# Patient Record
Sex: Male | Born: 1990 | Race: Black or African American | Hispanic: No | Marital: Married | Smoking: Current every day smoker
Health system: Southern US, Community
[De-identification: ages and names within clinical notes are randomized; demographics above are authoritative.]

## PROBLEM LIST (undated history)

## (undated) DIAGNOSIS — Z992 Dependence on renal dialysis: Secondary | ICD-10-CM

## (undated) DIAGNOSIS — I10 Essential (primary) hypertension: Secondary | ICD-10-CM

---

## 2020-06-16 ENCOUNTER — Inpatient Hospital Stay
Admission: EM | Admit: 2020-06-16 | Discharge: 2020-07-06 | DRG: 871 | Disposition: A | Discharge status: Home / Self Care | Payer: Medicaid Other | Attending: Hospitalist | Admitting: Hospitalist

## 2020-06-16 ENCOUNTER — Other Ambulatory Visit: Payer: Self-pay

## 2020-06-16 ENCOUNTER — Emergency Department: Payer: Medicaid Other

## 2020-06-16 DIAGNOSIS — J90 Pleural effusion, not elsewhere classified: Secondary | ICD-10-CM

## 2020-06-16 DIAGNOSIS — F17200 Nicotine dependence, unspecified, uncomplicated: Secondary | ICD-10-CM | POA: Diagnosis present

## 2020-06-16 DIAGNOSIS — E875 Hyperkalemia: Secondary | ICD-10-CM | POA: Diagnosis present

## 2020-06-16 DIAGNOSIS — E860 Dehydration: Secondary | ICD-10-CM | POA: Diagnosis present

## 2020-06-16 DIAGNOSIS — R7881 Bacteremia: Secondary | ICD-10-CM | POA: Diagnosis not present

## 2020-06-16 DIAGNOSIS — N2581 Secondary hyperparathyroidism of renal origin: Secondary | ICD-10-CM | POA: Diagnosis present

## 2020-06-16 DIAGNOSIS — D62 Acute posthemorrhagic anemia: Secondary | ICD-10-CM | POA: Diagnosis not present

## 2020-06-16 DIAGNOSIS — J13 Pneumonia due to Streptococcus pneumoniae: Secondary | ICD-10-CM | POA: Diagnosis present

## 2020-06-16 DIAGNOSIS — N179 Acute kidney failure, unspecified: Secondary | ICD-10-CM | POA: Diagnosis present

## 2020-06-16 DIAGNOSIS — Z20822 Contact with and (suspected) exposure to covid-19: Secondary | ICD-10-CM | POA: Diagnosis present

## 2020-06-16 DIAGNOSIS — Z841 Family history of disorders of kidney and ureter: Secondary | ICD-10-CM

## 2020-06-16 DIAGNOSIS — I248 Other forms of acute ischemic heart disease: Secondary | ICD-10-CM | POA: Diagnosis present

## 2020-06-16 DIAGNOSIS — N186 End stage renal disease: Secondary | ICD-10-CM | POA: Diagnosis present

## 2020-06-16 DIAGNOSIS — T82838A Hemorrhage of vascular prosthetic devices, implants and grafts, initial encounter: Secondary | ICD-10-CM | POA: Diagnosis not present

## 2020-06-16 DIAGNOSIS — D631 Anemia in chronic kidney disease: Secondary | ICD-10-CM | POA: Diagnosis present

## 2020-06-16 DIAGNOSIS — I10 Essential (primary) hypertension: Secondary | ICD-10-CM | POA: Diagnosis present

## 2020-06-16 DIAGNOSIS — E871 Hypo-osmolality and hyponatremia: Secondary | ICD-10-CM | POA: Diagnosis present

## 2020-06-16 DIAGNOSIS — I120 Hypertensive chronic kidney disease with stage 5 chronic kidney disease or end stage renal disease: Secondary | ICD-10-CM | POA: Diagnosis present

## 2020-06-16 DIAGNOSIS — B20 Human immunodeficiency virus [HIV] disease: Secondary | ICD-10-CM | POA: Diagnosis present

## 2020-06-16 DIAGNOSIS — J869 Pyothorax without fistula: Secondary | ICD-10-CM | POA: Diagnosis not present

## 2020-06-16 DIAGNOSIS — A419 Sepsis, unspecified organism: Secondary | ICD-10-CM | POA: Diagnosis not present

## 2020-06-16 DIAGNOSIS — R059 Cough, unspecified: Secondary | ICD-10-CM | POA: Diagnosis not present

## 2020-06-16 DIAGNOSIS — R778 Other specified abnormalities of plasma proteins: Secondary | ICD-10-CM | POA: Diagnosis not present

## 2020-06-16 DIAGNOSIS — M549 Dorsalgia, unspecified: Secondary | ICD-10-CM | POA: Diagnosis present

## 2020-06-16 DIAGNOSIS — R7989 Other specified abnormal findings of blood chemistry: Secondary | ICD-10-CM | POA: Diagnosis present

## 2020-06-16 DIAGNOSIS — D6959 Other secondary thrombocytopenia: Secondary | ICD-10-CM | POA: Diagnosis present

## 2020-06-16 DIAGNOSIS — B953 Streptococcus pneumoniae as the cause of diseases classified elsewhere: Secondary | ICD-10-CM | POA: Diagnosis present

## 2020-06-16 DIAGNOSIS — A403 Sepsis due to Streptococcus pneumoniae: Secondary | ICD-10-CM | POA: Diagnosis present

## 2020-06-16 DIAGNOSIS — Z9114 Patient's other noncompliance with medication regimen: Secondary | ICD-10-CM

## 2020-06-16 DIAGNOSIS — R509 Fever, unspecified: Secondary | ICD-10-CM

## 2020-06-16 DIAGNOSIS — N185 Chronic kidney disease, stage 5: Secondary | ICD-10-CM | POA: Diagnosis not present

## 2020-06-16 DIAGNOSIS — Z992 Dependence on renal dialysis: Secondary | ICD-10-CM | POA: Diagnosis not present

## 2020-06-16 DIAGNOSIS — Z8249 Family history of ischemic heart disease and other diseases of the circulatory system: Secondary | ICD-10-CM | POA: Diagnosis not present

## 2020-06-16 DIAGNOSIS — J189 Pneumonia, unspecified organism: Secondary | ICD-10-CM | POA: Diagnosis present

## 2020-06-16 DIAGNOSIS — Y838 Other surgical procedures as the cause of abnormal reaction of the patient, or of later complication, without mention of misadventure at the time of the procedure: Secondary | ICD-10-CM | POA: Diagnosis not present

## 2020-06-16 DIAGNOSIS — Z09 Encounter for follow-up examination after completed treatment for conditions other than malignant neoplasm: Secondary | ICD-10-CM

## 2020-06-16 DIAGNOSIS — D509 Iron deficiency anemia, unspecified: Secondary | ICD-10-CM | POA: Diagnosis present

## 2020-06-16 DIAGNOSIS — N289 Disorder of kidney and ureter, unspecified: Secondary | ICD-10-CM

## 2020-06-16 DIAGNOSIS — Z833 Family history of diabetes mellitus: Secondary | ICD-10-CM

## 2020-06-16 DIAGNOSIS — Z9119 Patient's noncompliance with other medical treatment and regimen: Secondary | ICD-10-CM

## 2020-06-16 DIAGNOSIS — J918 Pleural effusion in other conditions classified elsewhere: Secondary | ICD-10-CM | POA: Diagnosis not present

## 2020-06-16 DIAGNOSIS — I4510 Unspecified right bundle-branch block: Secondary | ICD-10-CM | POA: Diagnosis present

## 2020-06-16 DIAGNOSIS — R000 Tachycardia, unspecified: Secondary | ICD-10-CM | POA: Diagnosis present

## 2020-06-16 HISTORY — DX: Essential (primary) hypertension: I10

## 2020-06-16 LAB — TROPONIN I (HIGH SENSITIVITY)
Troponin I (High Sensitivity): 145 ng/L (ref <18)
Troponin I (High Sensitivity): 120 ng/L (ref <18)
Troponin I (High Sensitivity): 113 ng/L (ref <18)
Troponin I (High Sensitivity): 100 ng/L (ref <18)

## 2020-06-16 LAB — BLOOD CULTURE ID PANEL (REFLEXED) - BCID2

## 2020-06-16 LAB — BASIC METABOLIC PANEL
Anion gap: 14 (ref 5–15)
Anion gap: 14 (ref 5–15)
BUN: 134 mg/dL — ABNORMAL HIGH (ref 6–20)
BUN: 131 mg/dL — ABNORMAL HIGH (ref 6–20)
CO2: 15 mmol/L — ABNORMAL LOW (ref 22–32)
CO2: 16 mmol/L — ABNORMAL LOW (ref 22–32)
Calcium: 7.5 mg/dL — ABNORMAL LOW (ref 8.9–10.3)
Calcium: 7.7 mg/dL — ABNORMAL LOW (ref 8.9–10.3)
Chloride: 102 mmol/L (ref 98–111)
Chloride: 102 mmol/L (ref 98–111)
Creatinine, Ser: 15.34 mg/dL — ABNORMAL HIGH (ref 0.61–1.24)
Creatinine, Ser: 15.38 mg/dL — ABNORMAL HIGH (ref 0.61–1.24)
GFR calc Af Amer: 4 mL/min — ABNORMAL LOW (ref >60)
GFR calc Af Amer: 4 mL/min — ABNORMAL LOW (ref >60)
GFR calc non Af Amer: 4 mL/min — ABNORMAL LOW (ref >60)
GFR calc non Af Amer: 4 mL/min — ABNORMAL LOW (ref >60)
Glucose, Bld: 186 mg/dL — ABNORMAL HIGH (ref 70–99)
Glucose, Bld: 108 mg/dL — ABNORMAL HIGH (ref 70–99)
Potassium: 4.8 mmol/L (ref 3.5–5.1)
Potassium: 5 mmol/L (ref 3.5–5.1)
Sodium: 131 mmol/L — ABNORMAL LOW (ref 135–145)
Sodium: 132 mmol/L — ABNORMAL LOW (ref 135–145)

## 2020-06-16 LAB — COMPREHENSIVE METABOLIC PANEL
ALT: 30 U/L (ref 0–44)
AST: 15 U/L (ref 15–41)
Albumin: 3.2 g/dL — ABNORMAL LOW (ref 3.5–5.0)
Alkaline Phosphatase: 63 U/L (ref 38–126)
Anion gap: 17 — ABNORMAL HIGH (ref 5–15)
BUN: 134 mg/dL — ABNORMAL HIGH (ref 6–20)
CO2: 12 mmol/L — ABNORMAL LOW (ref 22–32)
Calcium: 8.4 mg/dL — ABNORMAL LOW (ref 8.9–10.3)
Chloride: 99 mmol/L (ref 98–111)
Creatinine, Ser: 16.68 mg/dL — ABNORMAL HIGH (ref 0.61–1.24)
GFR calc Af Amer: 4 mL/min — ABNORMAL LOW (ref >60)
GFR calc non Af Amer: 3 mL/min — ABNORMAL LOW (ref >60)
Glucose, Bld: 110 mg/dL — ABNORMAL HIGH (ref 70–99)
Potassium: 5.7 mmol/L — ABNORMAL HIGH (ref 3.5–5.1)
Sodium: 128 mmol/L — ABNORMAL LOW (ref 135–145)
Total Bilirubin: 0.7 mg/dL (ref 0.3–1.2)
Total Protein: 8.5 g/dL — ABNORMAL HIGH (ref 6.5–8.1)

## 2020-06-16 LAB — CBC WITH DIFFERENTIAL/PLATELET
Abs Immature Granulocytes: 0.36 10*3/uL — ABNORMAL HIGH (ref 0.00–0.07)
Basophils Absolute: 0 10*3/uL (ref 0.0–0.1)
Basophils Relative: 0 %
Eosinophils Absolute: 0 10*3/uL (ref 0.0–0.5)
Eosinophils Relative: 0 %
HCT: 22.3 % — ABNORMAL LOW (ref 39.0–52.0)
Hemoglobin: 7.4 g/dL — ABNORMAL LOW (ref 13.0–17.0)
Immature Granulocytes: 2 %
Lymphocytes Relative: 4 %
Lymphs Abs: 0.6 10*3/uL — ABNORMAL LOW (ref 0.7–4.0)
MCH: 26.1 pg (ref 26.0–34.0)
MCHC: 33.2 g/dL (ref 30.0–36.0)
MCV: 78.5 fL — ABNORMAL LOW (ref 80.0–100.0)
Monocytes Absolute: 0.5 10*3/uL (ref 0.1–1.0)
Monocytes Relative: 3 %
Neutro Abs: 14.7 10*3/uL — ABNORMAL HIGH (ref 1.7–7.7)
Neutrophils Relative %: 91 %
Platelets: 168 10*3/uL (ref 150–400)
RBC: 2.84 MIL/uL — ABNORMAL LOW (ref 4.22–5.81)
RDW: 17.5 % — ABNORMAL HIGH (ref 11.5–15.5)
Smear Review: NORMAL
WBC: 16.2 10*3/uL — ABNORMAL HIGH (ref 4.0–10.5)
nRBC: 0 % (ref 0.0–0.2)

## 2020-06-16 LAB — C-REACTIVE PROTEIN: CRP: 34.1 mg/dL — ABNORMAL HIGH (ref <1.0)

## 2020-06-16 LAB — SARS CORONAVIRUS 2 BY RT PCR (HOSPITAL ORDER, PERFORMED IN ~~LOC~~ HOSPITAL LAB): SARS Coronavirus 2: NEGATIVE

## 2020-06-16 LAB — LACTIC ACID, PLASMA
Lactic Acid, Venous: 0.9 mmol/L (ref 0.5–1.9)
Lactic Acid, Venous: 0.7 mmol/L (ref 0.5–1.9)

## 2020-06-16 LAB — TRIGLYCERIDES: Triglycerides: 135 mg/dL (ref <150)

## 2020-06-16 LAB — OSMOLALITY: Osmolality: 327 mOsm/kg (ref 275–295)

## 2020-06-16 LAB — FERRITIN: Ferritin: 192 ng/mL (ref 24–336)

## 2020-06-16 LAB — PROCALCITONIN: Procalcitonin: >150 ng/mL

## 2020-06-16 LAB — FIBRINOGEN: Fibrinogen: 657 mg/dL — ABNORMAL HIGH (ref 210–475)

## 2020-06-16 LAB — BRAIN NATRIURETIC PEPTIDE: B Natriuretic Peptide: 452 pg/mL — ABNORMAL HIGH (ref 0.0–100.0)

## 2020-06-16 LAB — LACTATE DEHYDROGENASE: LDH: 227 U/L — ABNORMAL HIGH (ref 98–192)

## 2020-06-16 LAB — FIBRIN DERIVATIVES D-DIMER (ARMC ONLY): Fibrin derivatives D-dimer (ARMC): 2350.74 ng/mL (FEU) — ABNORMAL HIGH (ref 0.00–499.00)

## 2020-06-16 MED ORDER — HYDRALAZINE HCL 20 MG/ML IJ SOLN
5.0000 mg | INTRAMUSCULAR | Status: DC | PRN
  Administered 2020-06-16 (×2): 5 mg via INTRAVENOUS
  Filled 2020-06-16 (×2): qty 1

## 2020-06-16 MED ORDER — LABETALOL HCL 5 MG/ML IV SOLN
5.0000 mg | Freq: Once | INTRAVENOUS | Status: AC
  Administered 2020-06-16: 5 mg via INTRAVENOUS
  Filled 2020-06-16: qty 4

## 2020-06-16 MED ORDER — SODIUM ZIRCONIUM CYCLOSILICATE 10 G PO PACK
10.0000 g | PACK | Freq: Once | ORAL | Status: DC
  Filled 2020-06-16: qty 1

## 2020-06-16 MED ORDER — DEXTROSE 50 % IV SOLN
50.0000 mL | Freq: Once | INTRAVENOUS | Status: AC
  Administered 2020-06-16: 50 mL via INTRAVENOUS
  Filled 2020-06-16: qty 50

## 2020-06-16 MED ORDER — LABETALOL HCL 5 MG/ML IV SOLN
10.0000 mg | INTRAVENOUS | Status: DC | PRN
  Administered 2020-06-17 – 2020-06-18 (×2): 10 mg via INTRAVENOUS
  Filled 2020-06-16 (×2): qty 4

## 2020-06-16 MED ORDER — ACETAMINOPHEN 325 MG PO TABS
650.0000 mg | ORAL_TABLET | Freq: Four times a day (QID) | ORAL | Status: DC | PRN
  Administered 2020-06-16 – 2020-07-05 (×5): 650 mg via ORAL
  Filled 2020-06-16 (×6): qty 2

## 2020-06-16 MED ORDER — TRAMADOL HCL 50 MG PO TABS
50.0000 mg | ORAL_TABLET | Freq: Four times a day (QID) | ORAL | Status: DC | PRN
  Administered 2020-06-16 – 2020-06-17 (×2): 50 mg via ORAL
  Filled 2020-06-16 (×2): qty 1

## 2020-06-16 MED ORDER — SODIUM CHLORIDE 0.9 % IV BOLUS
500.0000 mL | Freq: Once | INTRAVENOUS | Status: AC
  Administered 2020-06-16: 500 mL via INTRAVENOUS

## 2020-06-16 MED ORDER — SODIUM CHLORIDE 0.9 % IV SOLN
2.0000 g | INTRAVENOUS | Status: DC
  Administered 2020-06-16 – 2020-06-25 (×10): 2 g via INTRAVENOUS
  Filled 2020-06-16 (×2): qty 20
  Filled 2020-06-16: qty 2
  Filled 2020-06-16: qty 20
  Filled 2020-06-16 (×3): qty 2
  Filled 2020-06-16 (×2): qty 20
  Filled 2020-06-16 (×2): qty 2
  Filled 2020-06-16: qty 20

## 2020-06-16 MED ORDER — AMLODIPINE BESYLATE 5 MG PO TABS
5.0000 mg | ORAL_TABLET | Freq: Every day | ORAL | Status: DC
  Administered 2020-06-17: 5 mg via ORAL
  Filled 2020-06-16: qty 1

## 2020-06-16 MED ORDER — INSULIN ASPART 100 UNIT/ML IV SOLN
5.0000 [IU] | Freq: Once | INTRAVENOUS | Status: AC
  Administered 2020-06-16: 5 [IU] via INTRAVENOUS
  Filled 2020-06-16: qty 0.05

## 2020-06-16 MED ORDER — CALCIUM GLUCONATE-NACL 1-0.675 GM/50ML-% IV SOLN
1.0000 g | Freq: Once | INTRAVENOUS | Status: AC
  Administered 2020-06-16: 1000 mg via INTRAVENOUS
  Filled 2020-06-16: qty 50

## 2020-06-16 MED ORDER — PNEUMOCOCCAL VAC POLYVALENT 25 MCG/0.5ML IJ INJ: 0.5000 mL | INJECTION | INTRAMUSCULAR | Status: DC

## 2020-06-16 MED ORDER — SODIUM BICARBONATE 8.4 % IV SOLN
50.0000 meq | Freq: Once | INTRAVENOUS | Status: AC
  Administered 2020-06-16: 50 meq via INTRAVENOUS
  Filled 2020-06-16: qty 50

## 2020-06-16 MED ORDER — ONDANSETRON HCL 4 MG/2ML IJ SOLN
4.0000 mg | Freq: Three times a day (TID) | INTRAMUSCULAR | Status: DC | PRN
  Administered 2020-06-16 – 2020-06-18 (×3): 4 mg via INTRAVENOUS
  Filled 2020-06-16 (×3): qty 2

## 2020-06-16 MED ORDER — HYDRALAZINE HCL 20 MG/ML IJ SOLN
10.0000 mg | INTRAMUSCULAR | Status: DC | PRN
  Administered 2020-06-16 – 2020-06-21 (×5): 10 mg via INTRAVENOUS
  Filled 2020-06-16 (×5): qty 1

## 2020-06-16 MED ORDER — KETOROLAC TROMETHAMINE 30 MG/ML IJ SOLN
30.0000 mg | Freq: Once | INTRAMUSCULAR | Status: AC
  Administered 2020-06-16: 30 mg via INTRAVENOUS
  Filled 2020-06-16: qty 1

## 2020-06-16 MED ORDER — TRAMADOL HCL 50 MG PO TABS
50.0000 mg | ORAL_TABLET | Freq: Once | ORAL | Status: AC
  Administered 2020-06-16: 50 mg via ORAL
  Filled 2020-06-16: qty 1

## 2020-06-16 MED ORDER — HEPARIN SODIUM (PORCINE) 5000 UNIT/ML IJ SOLN
5000.0000 [IU] | Freq: Three times a day (TID) | INTRAMUSCULAR | Status: DC
  Administered 2020-06-16 – 2020-06-25 (×19): 5000 [IU] via SUBCUTANEOUS
  Filled 2020-06-16 (×22): qty 1

## 2020-06-16 MED ORDER — SODIUM CHLORIDE 0.9 % IV SOLN
500.0000 mg | INTRAVENOUS | Status: DC
  Administered 2020-06-16 – 2020-06-18 (×3): 500 mg via INTRAVENOUS
  Filled 2020-06-16 (×5): qty 500

## 2020-06-16 MED ORDER — ASPIRIN EC 81 MG PO TBEC
81.0000 mg | DELAYED_RELEASE_TABLET | Freq: Every day | ORAL | Status: DC
  Administered 2020-06-17 – 2020-06-26 (×9): 81 mg via ORAL
  Filled 2020-06-16 (×11): qty 1

## 2020-06-16 NOTE — ED Notes (Signed)
Date and time results received: 06/16/20 9:47 AM (use smartphrase ".now" to insert current time)  Test: troponin Critical Value: 145  Name of Provider Notified: Siadecki

## 2020-06-16 NOTE — H&P (Signed)
History and Physical    Daniel Combs XBD:[id number] DOB: [birth year] DOA: 06/16/2020  Referring MD/NP/PA:   PCP: System, Pcp Not In   Patient coming from:  The patient is coming from home.  At baseline, pt is independent for most of ADL.        Chief Complaint: SOB, cough and chest pain  HPI: Daniel Combs is a 29 y.o. male with medical history significant of hypertension, tobacco abuse, possible ESRD (his creatinine 11.1 on 03/20/2020), not on dialysis, who presents with shortness breath, cough, chest pain.  Patient is stating he has been having cough, shortness of breath for almost a week.  He also has chest pain and right sided back pain.  The chest pain is located in right side of the chest, sharp, pleuritic, aggravated with deep breath, 9 out of 10 severity, nonradiating.  He has cough with white-colored mucus production.  Denies fever or chills.  Patient states that he has nausea and vomited once yesterday, which has resolved currently.  Today patient does not have nausea vomiting, diarrhea or abdominal pain.  Denies symptoms of UTI or unilateral weakness.  Per chart review, patient had creatinine 11.1 on 03/20/2020.  Patient states that he is not on dialysis.  ED Course: pt was found to have WBC 16.2, troponin 145 --> 120, negative Covid PCR, sodium 128, potassium of 5.7, creatinine 16.68, BUN 134, temperature normal, blood pressure 171/135, heart rate 119, RR 25, oxygen saturation 98% on room air.  Chest x-ray showed infiltration in the left middle lobe and left lower lobe.  Patient is admitted to [place] bed as inpatient   Review of Systems:   General: no fevers, chills, no body weight gain, has fatigue HEENT: no blurry vision, hearing changes or sore throat Respiratory: has dyspnea, coughing, no wheezing CV: has chest pain, no palpitations GI: no nausea, vomiting, abdominal pain, diarrhea, constipation GU: no dysuria, burning on urination, increased urinary frequency,  hematuria  Ext: trace leg edema Neuro: no unilateral weakness, numbness, or tingling, no vision change or hearing loss Skin: no rash, no skin tear. MSK: No muscle spasm, no deformity, no limitation of range of movement in spin Heme: No easy bruising.  Travel history: No recent long distant travel.  Allergy: No Known Allergies  Past Medical History:  Diagnosis Date  . Hypertension     History reviewed. No pertinent surgical history.  Social History:  reports that he has been smoking. He has never used smokeless tobacco. He reports previous alcohol use. He reports that he does not use drugs.  Family History:  Family History  Problem Relation Age of Onset  . Diabetes Mellitus II Mother   . Hypertension Mother      Prior to Admission medications   Not on File    Physical Exam: Vitals:   06/16/20 1530 06/16/20 1600 06/16/20 1630 06/16/20 1700  BP: (!) 162/124 (!) 160/129 (!) 154/108 (!) 141/99  Pulse: (!) 115 (!) 107 (!) 109 (!) 107  Resp: 20 17 (!) 21 20  Temp:      TempSrc:      SpO2: 99% 100% 98% 98%   General: Not in acute distress HEENT:       Eyes: PERRL, EOMI, no scleral icterus.       ENT: No discharge from the ears and nose, no pharynx injection, no tonsillar enlargement.        Neck: No JVD, no bruit, no mass felt. Heme: No neck lymph node enlargement. Cardiac: S1/S2, RRR,  No murmurs, No gallops or rubs. Respiratory:  No rales, wheezing, rhonchi or rubs. GI: Soft, nondistended, nontender, no rebound pain, no organomegaly, BS present. GU: No hematuria Ext: has trace leg edema bilaterally. 1+DP/PT pulse bilaterally. Musculoskeletal: No joint deformities, No joint redness or warmth, no limitation of ROM in spin. Skin: No rashes.  Neuro: Alert, oriented X3, cranial nerves II-XII grossly intact, moves all extremities normally.  Psych: Patient is not psychotic, no suicidal or hemocidal ideation.  Labs on Admission: I have personally reviewed following labs and  imaging studies  CBC: Recent Labs  Lab 06/16/20 0832  WBC 16.2*  NEUTROABS 14.7*  HGB 7.4*  HCT 22.3*  MCV 78.5*  PLT 191   Basic Metabolic Panel: Recent Labs  Lab 06/16/20 0832 06/16/20 1331  NA 128* 131*  K 5.7* 4.8  CL 99 102  CO2 12* 15*  GLUCOSE 110* 186*  BUN 134* 134*  CREATININE 16.68* 15.34*  CALCIUM 8.4* 7.5*   GFR: CrCl cannot be calculated (Unknown ideal weight.). Liver Function Tests: Recent Labs  Lab 06/16/20 0832  AST 15  ALT 30  ALKPHOS 63  BILITOT 0.7  PROT 8.5*  ALBUMIN 3.2*   No results for input(s): LIPASE, AMYLASE in the last 168 hours. No results for input(s): AMMONIA in the last 168 hours. Coagulation Profile: No results for input(s): INR, PROTIME in the last 168 hours. Cardiac Enzymes: No results for input(s): CKTOTAL, CKMB, CKMBINDEX, TROPONINI in the last 168 hours. BNP (last 3 results) No results for input(s): PROBNP in the last 8760 hours. HbA1C: No results for input(s): HGBA1C in the last 72 hours. CBG: No results for input(s): GLUCAP in the last 168 hours. Lipid Profile: Recent Labs    06/16/20 0832  TRIG 135   Thyroid Function Tests: No results for input(s): TSH, T4TOTAL, FREET4, T3FREE, THYROIDAB in the last 72 hours. Anemia Panel: Recent Labs    06/16/20 0832  FERRITIN 192   Urine analysis: No results found for: COLORURINE, APPEARANCEUR, LABSPEC, PHURINE, GLUCOSEU, HGBUR, BILIRUBINUR, KETONESUR, PROTEINUR, UROBILINOGEN, NITRITE, LEUKOCYTESUR Sepsis Labs: @LABRCNTIP (procalcitonin:4,lacticidven:4) ) Recent Results (from the past 240 hour(s))  SARS Coronavirus 2 by RT PCR (hospital order, performed in [facility] hospital lab) Nasopharyngeal Nasopharyngeal Swab     Status: None   Collection Time: 06/16/20  8:32 AM   Specimen: Nasopharyngeal Swab  Result Value Ref Range Status   SARS Coronavirus 2 NEGATIVE NEGATIVE Final    Comment: (NOTE) SARS-CoV-2 target nucleic acids are NOT DETECTED.  The SARS-CoV-2 RNA  is generally detectable in upper and lower respiratory specimens during the acute phase of infection. The lowest concentration of SARS-CoV-2 viral copies this assay can detect is 250 copies / mL. A negative result does not preclude SARS-CoV-2 infection and should not be used as the sole basis for treatment or other patient management decisions.  A negative result may occur with improper specimen collection / handling, submission of specimen other than nasopharyngeal swab, presence of viral mutation(s) within the areas targeted by this assay, and inadequate number of viral copies (<250 copies / mL). A negative result must be combined with clinical observations, patient history, and epidemiological information.  Fact Sheet for Patients:   StrictlyIdeas.no  Fact Sheet for Healthcare Providers: BankingDealers.co.za  This test is not yet approved or  cleared by the [place] FDA and has been authorized for detection and/or diagnosis of SARS-CoV-2 by FDA under an Emergency Use Authorization (EUA).  This EUA will remain in effect (meaning this test can be  used) for the duration of the COVID-19 declaration under Section 564(b)(1) of the Act, 21 U.S.C. section 360bbb-3(b)(1), unless the authorization is terminated or revoked sooner.  Performed at [facility], [address] [place], [place] [address]      Radiological Exams on Admission: DG Chest [facility] 1 View  Result Date: 06/16/2020 CLINICAL DATA:  Shortness of breath with cough and chest pain EXAM: PORTABLE CHEST 1 VIEW COMPARISON:  None. FINDINGS: There is somewhat ill-defined airspace opacity in portions of the left mid lower lung regions. The right lung is clear. Heart is upper normal in size with pulmonary vascularity normal. No adenopathy. No bone lesions. IMPRESSION: Ill-defined airspace opacity left mid and lower lung regions consistent with multifocal pneumonia. Question  atypical organism pneumonia given this appearance. Lungs elsewhere clear. Heart upper normal in size. No evident adenopathy. Electronically Signed   By: Lowella Grip III M.D.   On: 06/16/2020 09:21     EKG: Independently reviewed.  Sinus rhythm, QTC 470, LAD, T wave thickening, LAD, incomplete right bundle blockage  Assessment/Plan Principal Problem:   CAP (community acquired pneumonia) Active Problems:   Hypertension   ESRD (end stage renal disease) (HCC)   Sepsis ([place])   Hyponatremia   Hyperkalemia   Elevated troponin   Anemia in ESRD (end-stage renal disease) ([place])   Sepsis due to CAP (community acquired pneumonia): Patient meets criteria for sepsis with leukocytosis, tachycardia with heart rate 119, tachypnea with RR 25.  Lactic acid is normal.  Currently hemodynamically stable.  Chest x-ray showed infiltration in left lower lobe and the left middle lobe, consistent with CAP.  - Will admit to med-surg bed as inpt - IV Rocephin and azithromycin - Mucinex for cough  - Bronchodilators - Urine legionella and S. pneumococcal antigen - Follow up blood culture x2, sputum culture - will get Procalcitonin >150 - IVF:  500 cc x 2 of NS bolus in ED  Hypertension -Amlodipine 5 mg daily -IV hydralazine as needed  ESRD (end stage renal disease) ([place]) -Renal, Dr. Holley Raring is consulted  Hyponatremia: Sodium 128. Most likely due to nausea, vomiting and poor oral intake  -Patient received 500 cc x 2 normal saline in ED - Will check urine sodium, urine osmolality, serum osmolality. - f/u by BMP q8h - avoid over correction too fast due to risk of central pontine myelinolysis  Hyperkalemia: K 5.7 -Patient was given D50, NovoLog 5 units, 50 mEq of sodium bicarbonate -10 g of leukoma  Elevated troponin: Troponin I 45, 120, likely due to decreased clearance secondary to ESRD -Aspirin 81 mg daily -Check A1c, FLP, UDS -Repeat EKG in the morning  Anemia in ESRD (end-stage renal  disease) ([place]): Hemoglobin 7.4 -Follow-up with CBC -Check anemia panel      DVT ppx: SQ Heparin Code Status: Full code Family Communication: not done, no family member is at bed side.   Disposition Plan:  Anticipate discharge back to previous environment Consults called:  Dr. Holley Raring of renal Admission status: Med-surg bed as inpt       Status is: Inpatient  Remains inpatient appropriate because:Inpatient level of care appropriate due to severity of illness.  Patient has a possible ESRD not on dialysis, now presents with multiple acute issues, including sepsis due to CAP, hyponatremia, hyperkalemia, elevated troponin, anemia.  His presentation is highly complicated.  Patient may need to start dialysis. He is at high risk of deteriorating.  Need to be treated in the hospital for at least 2 days.  Dispo: The patient is from: Home              Anticipated d/c is to: Home              Anticipated d/c date is: 2 days              Patient currently is not medically stable to d/c.           Date of Service 06/16/2020    Ivor Costa [facility]   If 7PM-7AM, please contact night-coverage [email or web address] 06/16/2020, 5:11 PM

## 2020-06-16 NOTE — ED Provider Notes (Signed)
[facility] Emergency Department Provider Note ____________________________________________   First MD Initiated Contact with Patient 06/16/20 0831     (approximate)  I have reviewed the triage vital signs and the nursing notes.   HISTORY  Chief Complaint Chest Pain, Cough, and Back Pain    HPI Daniel Combs is a 29 y.o. male with PMH as noted below who presents with chest pain over the last week, gradual onset, persistent course, described as sharp and pleuritic.  He has associated shortness of breath and some pain in his back.  He has not noted any fever at home.  He has had vomiting and diarrhea.  The patient denies any sick contacts.  He is not vaccinated for Covid.  He states that he used to be on labetalol for blood pressure but has not taken it in a few months.  Past Medical History:  Diagnosis Date  . Hypertension     Patient Active Problem List   Diagnosis Date Noted  . CAP (community acquired pneumonia) 06/16/2020    History reviewed. No pertinent surgical history.  Prior to Admission medications   Not on File    Allergies Patient has no known allergies.  History reviewed. No pertinent family history.  Social History Social History   Tobacco Use  . Smoking status: Current Every Day Smoker  . Smokeless tobacco: Never Used  Substance Use Topics  . Alcohol use: Not on file  . Drug use: Not on file    Review of Systems  Constitutional: No fever. Eyes: No redness. ENT: No sore throat. Cardiovascular: Positive for chest pain. Respiratory: Positive for shortness of breath. Gastrointestinal: No positive for vomiting and diarrhea.  Genitourinary: Negative for dysuria.  Musculoskeletal: Positive for back pain. Skin: Negative for rash. Neurological: Negative for headache.  ____________________________________________   PHYSICAL EXAM:  VITAL SIGNS: ED Triage Vitals  Enc Vitals Group     BP 06/16/20 0827 (!) 171/135      Pulse Rate 06/16/20 0827 (!) 119     Resp 06/16/20 0827 (!) 23     Temp 06/16/20 0827 98.9 F (37.2 C)     Temp Source 06/16/20 0827 Oral     SpO2 06/16/20 0827 98 %     Weight --      Height --      Head Circumference --      Peak Flow --      Pain Score 06/16/20 0828 10     Pain Loc --      Pain Edu? --      Excl. in [place]? --     Constitutional: Alert and oriented.  Uncomfortable appearing but in no acute distress. Eyes: Conjunctivae are normal.  Head: Atraumatic. Nose: No congestion/rhinnorhea. Mouth/Throat: Mucous membranes are moist.   Neck: Normal range of motion.  Cardiovascular: Tachycardic, regular rhythm. Grossly normal heart sounds.  Good peripheral circulation. Respiratory: Increased respiratory effort.  No retractions.  Slightly coarse breath sounds bilaterally. Gastrointestinal: Soft and nontender. No distention.  Genitourinary: No flank tenderness. Musculoskeletal: No lower extremity edema.  No calf or popliteal swelling or tenderness.  Extremities warm and well perfused.  Neurologic:  Normal speech and language. No gross focal neurologic deficits are appreciated.  Skin:  Skin is warm and dry. No rash noted. Psychiatric: Mood and affect are normal. Speech and behavior are normal.  ____________________________________________   LABS (all labs ordered are listed, but only abnormal results are displayed)  Labs Reviewed  CBC WITH DIFFERENTIAL/PLATELET - Abnormal;  Notable for the following components:      Result Value   WBC 16.2 (*)    RBC 2.84 (*)    Hemoglobin 7.4 (*)    HCT 22.3 (*)    MCV 78.5 (*)    RDW 17.5 (*)    Neutro Abs 14.7 (*)    Lymphs Abs 0.6 (*)    Abs Immature Granulocytes 0.36 (*)    All other components within normal limits  COMPREHENSIVE METABOLIC PANEL - Abnormal; Notable for the following components:   Sodium 128 (*)    Potassium 5.7 (*)    CO2 12 (*)    Glucose, Bld 110 (*)    BUN 134 (*)    Creatinine, Ser 16.68 (*)    Calcium  8.4 (*)    Total Protein 8.5 (*)    Albumin 3.2 (*)    GFR calc non Af Amer 3 (*)    GFR calc Af Amer 4 (*)    Anion gap 17 (*)    All other components within normal limits  FIBRIN DERIVATIVES D-DIMER ([facility] ONLY) - Abnormal; Notable for the following components:   Fibrin derivatives D-dimer ([facility]) 2,350.74 (*)    All other components within normal limits  LACTATE DEHYDROGENASE - Abnormal; Notable for the following components:   LDH 227 (*)    All other components within normal limits  FIBRINOGEN - Abnormal; Notable for the following components:   Fibrinogen 657 (*)    All other components within normal limits  TROPONIN I (HIGH SENSITIVITY) - Abnormal; Notable for the following components:   Troponin I (High Sensitivity) 145 (*)    All other components within normal limits  SARS CORONAVIRUS 2 BY RT PCR (HOSPITAL ORDER, [facility])  CULTURE, BLOOD (ROUTINE X 2)  CULTURE, BLOOD (ROUTINE X 2)  PROCALCITONIN  FERRITIN  TRIGLYCERIDES  LACTIC ACID, PLASMA  C-REACTIVE PROTEIN  BRAIN NATRIURETIC PEPTIDE  LACTIC ACID, PLASMA  TROPONIN I (HIGH SENSITIVITY)   ____________________________________________  EKG  ED ECG REPORT I, Arta Silence, the attending physician, personally viewed and interpreted this ECG.  Date: 06/16/2020 EKG Time: 0819 Rate: 119 Rhythm: Sinus tachycardia QRS Axis: normal Intervals: Incomplete RBBB, LAFB ST/T Wave abnormalities: normal Narrative Interpretation: Sinus tachycardia with no evidence of acute ischemia  ____________________________________________  RADIOLOGY  CXR: Multifocal pneumonia  ____________________________________________   PROCEDURES  Procedure(s) performed: No  Procedures  Critical Care performed: Yes  CRITICAL CARE Performed by: Arta Silence   Total critical care time: 30 minutes  Critical care time was exclusive of separately billable procedures and treating other  patients.  Critical care was necessary to treat or prevent imminent or life-threatening deterioration.  Critical care was time spent personally by me on the following activities: development of treatment plan with patient and/or surrogate as well as nursing, discussions with consultants, evaluation of patient's response to treatment, examination of patient, obtaining history from patient or surrogate, ordering and performing treatments and interventions, ordering and review of laboratory studies, ordering and review of radiographic studies, pulse oximetry and re-evaluation of patient's condition. ____________________________________________   INITIAL IMPRESSION / ASSESSMENT AND PLAN / ED COURSE  Pertinent labs & imaging results that were available during my care of the patient were reviewed by me and considered in my medical decision making (see chart for details).  29 year old male with a history of hypertension presents with atypical chest pain, shortness of breath and GI symptoms over the last week.  He is not vaccinated for COVID-19  but denies any sick contacts.  On exam, the patient is uncomfortable appearing but in no acute distress.  O2 saturation is in the high 90s on room air, however he does have tachypnea, tachycardia, and hypertension.  Temperature is also borderline elevated.  Slightly coarse breath sounds bilaterally.  Exam is otherwise unremarkable.  Differential includes COVID-19, acute bronchitis, pneumonia, or less likely ACS or other cardiac etiology.  Differential also includes PE, although the patient has no PE risk factors or any signs or symptoms of DVT.  There is no evidence of vascular etiology.  We will obtain chest x-ray, lab work-up, Covid swab, and reassess.  I have also ordered a fluid bolus, analgesia, and a dose of IV labetalol to treat the hypertension.  ----------------------------------------- 12:07 PM on  06/16/2020 -----------------------------------------  The patient's vital signs have improved and he remains hemodynamically stable.  On reassessment, he continues to have tachypnea but with improved work of breathing.   Chest x-ray shows multifocal infiltrates consistent with pneumonia.  However, the patient's Covid swab is negative.  Work-up is also significant for an elevated troponin of 145 and new AKI with a creatinine of 16.  I suspect that the troponin elevation is at least somewhat related to the renal insufficiency.  He is also borderline hyperkalemic although EKG shows normal QRS with only slightly peaked T waves.  The lactate is normal.  I ordered IV antibiotics for CAP.  I discussed the case with Dr. Blaine Hamper from the hospitalist service.  He has ordered [facility] for the hyperkalemia, and I ordered IV calcium.  ______________________  Lovena Le was evaluated in Emergency Department on 06/16/2020 for the symptoms described in the history of present illness. He was evaluated in the context of the global COVID-19 pandemic, which necessitated consideration that the patient might be at risk for infection with the SARS-CoV-2 virus that causes COVID-19. Institutional protocols and algorithms that pertain to the evaluation of patients at risk for COVID-19 are in a state of rapid change based on information released by regulatory bodies including the CDC and federal and state organizations. These policies and algorithms were followed during the patient's care in the ED.  ____________________________________________   FINAL CLINICAL IMPRESSION(S) / ED DIAGNOSES  Final diagnoses:  Community acquired pneumonia, unspecified laterality  Acute renal insufficiency  Hypertension, unspecified type  Hyperkalemia      NEW MEDICATIONS STARTED DURING THIS VISIT:  New Prescriptions   No medications on file     Note:  This document was prepared using Dragon voice recognition software and may  include unintentional dictation errors.    Arta Silence, MD 06/16/20 [id number]

## 2020-06-16 NOTE — ED Triage Notes (Signed)
PER EMS, CALLED TO PT'S HOME D/T CP, COUGH, AND BACK PAIN THAT'S BEEN GOING ON "FOR ABOUT A WEEK". PT HAS HX OF HTN AND HASN'T TAKEN HIS LABETOLOL IN "A FEW MONTHS'. PT ASKING FOR WATER AND PAIN MEDICATION UPON ARRIVAL. TEMP 99.66F. PT STATES HE HASN'T TAKEN ANYTHING AT HOME FOR SYMPTOMS. 325 ASA AND 1/2 [place] VIA EMS.

## 2020-06-17 ENCOUNTER — Inpatient Hospital Stay: Payer: Medicaid Other

## 2020-06-17 DIAGNOSIS — B20 Human immunodeficiency virus [HIV] disease: Secondary | ICD-10-CM

## 2020-06-17 LAB — BASIC METABOLIC PANEL
Anion gap: 16 — ABNORMAL HIGH (ref 5–15)
BUN: 145 mg/dL — ABNORMAL HIGH (ref 6–20)
CO2: 13 mmol/L — ABNORMAL LOW (ref 22–32)
Calcium: 7.7 mg/dL — ABNORMAL LOW (ref 8.9–10.3)
Chloride: 101 mmol/L (ref 98–111)
Creatinine, Ser: 16.44 mg/dL — ABNORMAL HIGH (ref 0.61–1.24)
GFR calc Af Amer: 4 mL/min — ABNORMAL LOW (ref >60)
GFR calc non Af Amer: 3 mL/min — ABNORMAL LOW (ref >60)
Glucose, Bld: 88 mg/dL (ref 70–99)
Potassium: 5.4 mmol/L — ABNORMAL HIGH (ref 3.5–5.1)
Sodium: 130 mmol/L — ABNORMAL LOW (ref 135–145)

## 2020-06-17 LAB — LIPID PANEL
Cholesterol: 94 mg/dL (ref 0–200)
HDL: 24 mg/dL — ABNORMAL LOW (ref >40)
LDL Cholesterol: 40 mg/dL (ref 0–99)
Total CHOL/HDL Ratio: 3.9 RATIO
Triglycerides: 151 mg/dL — ABNORMAL HIGH (ref <150)
VLDL: 30 mg/dL (ref 0–40)

## 2020-06-17 LAB — FERRITIN: Ferritin: 200 ng/mL (ref 24–336)

## 2020-06-17 LAB — VITAMIN B12: Vitamin B-12: 1376 pg/mL — ABNORMAL HIGH (ref 180–914)

## 2020-06-17 LAB — CBC
HCT: 19.3 % — ABNORMAL LOW (ref 39.0–52.0)
Hemoglobin: 6.5 g/dL — ABNORMAL LOW (ref 13.0–17.0)
MCH: 26.3 pg (ref 26.0–34.0)
MCHC: 33.7 g/dL (ref 30.0–36.0)
MCV: 78.1 fL — ABNORMAL LOW (ref 80.0–100.0)
Platelets: 149 10*3/uL — ABNORMAL LOW (ref 150–400)
RBC: 2.47 MIL/uL — ABNORMAL LOW (ref 4.22–5.81)
RDW: 17.7 % — ABNORMAL HIGH (ref 11.5–15.5)
WBC: 10.8 10*3/uL — ABNORMAL HIGH (ref 4.0–10.5)
nRBC: 0.4 % — ABNORMAL HIGH (ref 0.0–0.2)

## 2020-06-17 LAB — URINE DRUG SCREEN, QUALITATIVE (ARMC ONLY)
Amphetamines, Ur Screen: NOT DETECTED
Barbiturates, Ur Screen: NOT DETECTED
Benzodiazepine, Ur Scrn: NOT DETECTED
Cannabinoid 50 Ng, Ur ~~LOC~~: NOT DETECTED
Cocaine Metabolite,Ur ~~LOC~~: NOT DETECTED
MDMA (Ecstasy)Ur Screen: NOT DETECTED
Methadone Scn, Ur: NOT DETECTED
Opiate, Ur Screen: NOT DETECTED
Phencyclidine (PCP) Ur S: NOT DETECTED
Tricyclic, Ur Screen: NOT DETECTED

## 2020-06-17 LAB — IRON AND TIBC
Iron: 11 ug/dL — ABNORMAL LOW (ref 45–182)
Saturation Ratios: 9 % — ABNORMAL LOW (ref 17.9–39.5)
TIBC: 119 ug/dL — ABNORMAL LOW (ref 250–450)
UIBC: 108 ug/dL

## 2020-06-17 LAB — STREP PNEUMONIAE URINARY ANTIGEN: Strep Pneumo Urinary Antigen: POSITIVE — AB

## 2020-06-17 LAB — RETICULOCYTES
Immature Retic Fract: 7.9 % (ref 2.3–15.9)
RBC.: 2.58 MIL/uL — ABNORMAL LOW (ref 4.22–5.81)
Retic Count, Absolute: 48.8 10*3/uL (ref 19.0–186.0)
Retic Ct Pct: 1.9 % (ref 0.4–3.1)

## 2020-06-17 LAB — SODIUM, URINE, RANDOM: Sodium, Ur: 34 mmol/L

## 2020-06-17 LAB — HEMOGLOBIN A1C
Hgb A1c MFr Bld: 4.9 % (ref 4.8–5.6)
Mean Plasma Glucose: 93.93 mg/dL

## 2020-06-17 LAB — FOLATE: Folate: 7.9 ng/mL (ref >5.9)

## 2020-06-17 LAB — ABO/RH: ABO/RH(D): A POS

## 2020-06-17 LAB — HIV ANTIBODY (ROUTINE TESTING W REFLEX): HIV Screen 4th Generation wRfx: REACTIVE — AB

## 2020-06-17 LAB — PREPARE RBC (CROSSMATCH)

## 2020-06-17 LAB — HEMOGLOBIN AND HEMATOCRIT, BLOOD
HCT: 23.9 % — ABNORMAL LOW (ref 39.0–52.0)
Hemoglobin: 8.2 g/dL — ABNORMAL LOW (ref 13.0–17.0)

## 2020-06-17 LAB — OSMOLALITY, URINE: Osmolality, Ur: 323 mOsm/kg (ref 300–900)

## 2020-06-17 LAB — TROPONIN I (HIGH SENSITIVITY): Troponin I (High Sensitivity): 107 ng/L (ref <18)

## 2020-06-17 MED ORDER — OXYCODONE-ACETAMINOPHEN 7.5-325 MG PO TABS
1.0000 | ORAL_TABLET | Freq: Four times a day (QID) | ORAL | Status: DC | PRN
  Administered 2020-06-21 – 2020-07-05 (×21): 1 via ORAL
  Filled 2020-06-17 (×24): qty 1

## 2020-06-17 MED ORDER — AMLODIPINE BESYLATE 10 MG PO TABS
10.0000 mg | ORAL_TABLET | Freq: Every day | ORAL | Status: DC
  Administered 2020-06-18 – 2020-07-06 (×17): 10 mg via ORAL
  Filled 2020-06-17 (×19): qty 1

## 2020-06-17 MED ORDER — PATIROMER SORBITEX CALCIUM 8.4 G PO PACK
16.8000 g | PACK | Freq: Every day | ORAL | Status: DC
  Administered 2020-06-17 – 2020-06-18 (×2): 16.8 g via ORAL
  Filled 2020-06-17 (×3): qty 2

## 2020-06-17 MED ORDER — SODIUM ZIRCONIUM CYCLOSILICATE 5 G PO PACK: 5.0000 g | PACK | Freq: Once | ORAL | Status: DC

## 2020-06-17 MED ORDER — SODIUM CHLORIDE 0.9% IV SOLUTION: Freq: Once | INTRAVENOUS | Status: AC

## 2020-06-17 MED ORDER — HYDROCOD POLST-CPM POLST ER 10-8 MG/5ML PO SUER
5.0000 mL | Freq: Every evening | ORAL | Status: DC | PRN
  Administered 2020-06-17 – 2020-06-26 (×6): 5 mL via ORAL
  Filled 2020-06-17 (×6): qty 5

## 2020-06-17 MED ORDER — MORPHINE SULFATE (PF) 2 MG/ML IV SOLN
1.0000 mg | INTRAVENOUS | Status: DC | PRN
  Administered 2020-06-17 – 2020-06-28 (×7): 1 mg via INTRAVENOUS
  Filled 2020-06-17 (×7): qty 1

## 2020-06-17 MED ORDER — SODIUM CHLORIDE 0.9 % IV SOLN
150.0000 mg | INTRAVENOUS | Status: DC
  Administered 2020-06-17: 150 mg via INTRAVENOUS
  Filled 2020-06-17 (×3): qty 7.5

## 2020-06-17 MED ORDER — SODIUM ZIRCONIUM CYCLOSILICATE 10 G PO PACK
10.0000 g | PACK | Freq: Once | ORAL | Status: AC
  Administered 2020-06-17: 10 g via ORAL
  Filled 2020-06-17: qty 1

## 2020-06-17 NOTE — Progress Notes (Signed)
PT Cancellation Note  Patient Details Name: Daniel Combs MRN: [id number] DOB: 1991/06/23   Cancelled Treatment:    Reason Eval/Treat Not Completed: Other (comment). Consult received and chart reviewed. Pt with low Hgb (6.5) pending transfusion this date. Also with abnormal elevated K+. Not appropriate for PT evaluation this date. Will hold and re-attempt next available date.   Chaynce Schafer 06/17/2020, 9:47 AM  Greggory Stallion, PT, DPT [phone]

## 2020-06-17 NOTE — Progress Notes (Signed)
notifed MD on patient MEWS yellow BP 158/118 mHR 115 asked MD for morphine for patient pain not resolved and gave labetalol per MD for BP

## 2020-06-17 NOTE — Consult Note (Signed)
[facility] CONSULT NOTE    Date: 06/17/2020                  Patient Name:  Daniel Combs  MRN: [id number]  DOB: 1991/06/13  Age / Sex: 29 y.o., male         PCP: System, Pcp Not In                 Service Requesting Consult:  Hospitalist                 Reason for Consult:  End-stage renal disease not yet on dialysis            History of Present Illness: Patient is a 29 y.o. male with a PMHx of longstanding hypertension, ESRD not yet on dialysis, anemia of chronic kidney disease, secondary hyperparathyroidism, who was admitted to [facility] on 06/16/2020 for evaluation of shortness of breath and chest pain.  Patient from [place] and visiting his mother.  Has been here for approximately 3 weeks.  He was following with a nephrologist in [place] and was told approximately a year ago that he needed dialysis.  However patient states that due to the pandemic he was unable to keep some appointments.  He does not have an underlying dialysis access.  He has significant azotemia with a BUN of 145 and creatinine of 16.4.  He also has mild hyperkalemia with serum potassium of 5.4.  We had a long discussion today about renal placement therapy and patient not yet ready to start on dialysis per his report.  He had significantly elevated BNP of 452.  Also has significant anemia with hemoglobin of 6.5.   Medications: Outpatient medications: Medications Prior to Admission  Medication Sig Dispense Refill Last Dose  . losartan (COZAAR) 100 MG tablet Take 100 mg by mouth daily.   Unknown at Unknown    Current medications: Current Facility-Administered Medications  Medication Dose Route Frequency Provider Last Rate Last Admin  . 0.9 %  sodium chloride infusion (Manually program via Guardrails IV Fluids)   Intravenous Once Sharion Settler, NP      . acetaminophen (TYLENOL) tablet 650 mg  650 mg Oral Q6H PRN Ivor Costa, MD   650 mg at 06/16/20 2056  . [START ON 06/18/2020]  amLODipine (NORVASC) tablet 10 mg  10 mg Oral Daily Wyvonnia Dusky, MD      . aspirin EC tablet 81 mg  81 mg Oral Daily Ivor Costa, MD   81 mg at 06/17/20 0847  . azithromycin (ZITHROMAX) 500 mg in sodium chloride 0.9 % 250 mL IVPB  500 mg Intravenous Q24H Ivor Costa, MD 250 mL/hr at 06/17/20 1309 500 mg at 06/17/20 1309  . cefTRIAXone (ROCEPHIN) 2 g in sodium chloride 0.9 % 100 mL IVPB  2 g Intravenous Q24H Ivor Costa, MD 200 mL/hr at 06/17/20 0850 2 g at 06/17/20 0850  . chlorpheniramine-HYDROcodone (TUSSIONEX) 10-8 MG/5ML suspension 5 mL  5 mL Oral QHS PRN Wyvonnia Dusky, MD      . heparin injection 5,000 Units  5,000 Units Subcutaneous Q8H Ivor Costa, MD   5,000 Units at 06/17/20 1301  . hydrALAZINE (APRESOLINE) injection 10 mg  10 mg Intravenous Q4H PRN Sharion Settler, NP   10 mg at 06/16/20 2056  . iron sucrose (VENOFER) 150 mg in sodium chloride 0.9 % 150 mL IVPB  150 mg Intravenous Q24H Wyvonnia Dusky, MD 157.5 mL/hr at 06/17/20 1441 150 mg at 06/17/20  1441  . labetalol (NORMODYNE) injection 10 mg  10 mg Intravenous Q4H PRN Sharion Settler, NP   10 mg at 06/17/20 1301  . morphine 2 MG/ML injection 1 mg  1 mg Intravenous Q3H PRN Wyvonnia Dusky, MD   1 mg at 06/17/20 1301  . ondansetron (ZOFRAN) injection 4 mg  4 mg Intravenous Q8H PRN Ivor Costa, MD   4 mg at 06/17/20 1353  . oxyCODONE-acetaminophen (PERCOCET) 7.5-325 MG per tablet 1 tablet  1 tablet Oral Q6H PRN Wyvonnia Dusky, MD      . sodium zirconium cyclosilicate (LOKELMA) packet 10 g  10 g Oral Once Ivor Costa, MD          Allergies: No Known Allergies    Past Medical History: Past Medical History:  Diagnosis Date  . Hypertension      Past Surgical History: History reviewed. No pertinent surgical history.   Family History: Family History  Problem Relation Age of Onset  . Diabetes Mellitus II Mother   . Hypertension Mother      Social History: Social History   Socioeconomic History   . Marital status: Unknown    Spouse name: Not on file  . Number of children: Not on file  . Years of education: Not on file  . Highest education level: Not on file  Occupational History  . Not on file  Tobacco Use  . Smoking status: Current Every Day Smoker  . Smokeless tobacco: Never Used  Substance and Sexual Activity  . Alcohol use: Not Currently  . Drug use: Never  . Sexual activity: Not on file  Other Topics Concern  . Not on file  Social History Narrative  . Not on file   Social Determinants of Health   Financial Resource Strain:   . Difficulty of Paying Living Expenses: Not on file  Food Insecurity:   . Worried About Charity fundraiser in the Last Year: Not on file  . Ran Out of Food in the Last Year: Not on file  Transportation Needs:   . Lack of Transportation (Medical): Not on file  . Lack of Transportation (Non-Medical): Not on file  Physical Activity:   . Days of Exercise per Week: Not on file  . Minutes of Exercise per Session: Not on file  Stress:   . Feeling of Stress : Not on file  Social Connections:   . Frequency of Communication with Friends and Family: Not on file  . Frequency of Social Gatherings with Friends and Family: Not on file  . Attends Religious Services: Not on file  . Active Member of Clubs or Organizations: Not on file  . Attends Archivist Meetings: Not on file  . Marital Status: Not on file  Intimate Partner Violence:   . Fear of Current or Ex-Partner: Not on file  . Emotionally Abused: Not on file  . Physically Abused: Not on file  . Sexually Abused: Not on file     Review of Systems: Review of Systems  Constitutional: Positive for malaise/fatigue. Negative for chills and fever.  HENT: Negative for congestion, hearing loss and tinnitus.   Eyes: Negative for blurred vision and double vision.  Respiratory: Positive for shortness of breath. Negative for cough and sputum production.   Cardiovascular: Positive for chest  pain. Negative for palpitations and orthopnea.  Gastrointestinal: Positive for nausea and vomiting. Negative for diarrhea.  Genitourinary: Negative for dysuria, frequency and urgency.  Musculoskeletal: Negative for myalgias.  Skin: Negative for  itching and rash.  Neurological: Negative for dizziness and focal weakness.  Endo/Heme/Allergies: Negative for polydipsia. Does not bruise/bleed easily.  Psychiatric/Behavioral: The patient is not nervous/anxious.      Vital Signs: Blood pressure (!) 158/118, pulse (!) 115, temperature 98.3 F (36.8 C), temperature source Oral, resp. rate 16, height 5\' 6"  (1.676 m), weight 60.8 kg, SpO2 98 %.  Weight trends: Filed Weights   06/16/20 2228  Weight: 60.8 kg    Physical Exam: Physical Exam: General:  No acute distress  Head:  Normocephalic, atraumatic. Moist oral mucosal membranes  Eyes:  Anicteric  Neck:  Supple  Lungs:   Basilar rales  Heart:  S1S2 no obvious pericardial friction rub  Abdomen:   Soft, nontender, bowel sounds present  Extremities:  No peripheral edema.  Neurologic:  Awake, alert, following commands  Skin:  No lesions  Access:  No dialysis access    Lab results: Basic Metabolic Panel: Recent Labs  Lab 06/16/20 1331 06/16/20 2031 06/17/20 0540  NA 131* 132* 130*  K 4.8 5.0 5.4*  CL 102 102 101  CO2 15* 16* 13*  GLUCOSE 186* 108* 88  BUN 134* 131* 145*  CREATININE 15.34* 15.38* 16.44*  CALCIUM 7.5* 7.7* 7.7*    Liver Function Tests: Recent Labs  Lab 06/16/20 0832  AST 15  ALT 30  ALKPHOS 63  BILITOT 0.7  PROT 8.5*  ALBUMIN 3.2*   No results for input(s): LIPASE, AMYLASE in the last 168 hours. No results for input(s): AMMONIA in the last 168 hours.  CBC: Recent Labs  Lab 06/16/20 0832 06/17/20 0540  WBC 16.2* 10.8*  NEUTROABS 14.7*  --   HGB 7.4* 6.5*  HCT 22.3* 19.3*  MCV 78.5* 78.1*  PLT 168 149*    Cardiac Enzymes: No results for input(s): CKTOTAL, CKMB, CKMBINDEX, TROPONINI in the  last 168 hours.  BNP: Invalid input(s): POCBNP  CBG: No results for input(s): GLUCAP in the last 168 hours.  Microbiology: Results for orders placed or performed during the hospital encounter of 06/16/20  SARS Coronavirus 2 by RT PCR (hospital order, performed in [facility] hospital lab) Nasopharyngeal Nasopharyngeal Swab     Status: None   Collection Time: 06/16/20  8:32 AM   Specimen: Nasopharyngeal Swab  Result Value Ref Range Status   SARS Coronavirus 2 NEGATIVE NEGATIVE Final    Comment: (NOTE) SARS-CoV-2 target nucleic acids are NOT DETECTED.  The SARS-CoV-2 RNA is generally detectable in upper and lower respiratory specimens during the acute phase of infection. The lowest concentration of SARS-CoV-2 viral copies this assay can detect is 250 copies / mL. A negative result does not preclude SARS-CoV-2 infection and should not be used as the sole basis for treatment or other patient management decisions.  A negative result may occur with improper specimen collection / handling, submission of specimen other than nasopharyngeal swab, presence of viral mutation(s) within the areas targeted by this assay, and inadequate number of viral copies (<250 copies / mL). A negative result must be combined with clinical observations, patient history, and epidemiological information.  Fact Sheet for Patients:   StrictlyIdeas.no  Fact Sheet for Healthcare Providers: BankingDealers.co.za  This test is not yet approved or  cleared by the [place] FDA and has been authorized for detection and/or diagnosis of SARS-CoV-2 by FDA under an Emergency Use Authorization (EUA).  This EUA will remain in effect (meaning this test can be used) for the duration of the COVID-19 declaration under Section 564(b)(1) of the Act,  21 U.S.C. section 360bbb-3(b)(1), unless the authorization is terminated or revoked sooner.  Performed at [facility], [address] [place], [place] [address]   Blood Culture (routine x 2)     Status: None (Preliminary result)   Collection Time: 06/16/20  8:32 AM   Specimen: BLOOD  Result Value Ref Range Status   Specimen Description BLOOD LEFT FORE ARM  Final   Special Requests   Final    BOTTLES DRAWN AEROBIC AND ANAEROBIC Blood Culture results may not be optimal due to an inadequate volume of blood received in culture bottles   Culture  Setup Time   Final    GRAM POSITIVE COCCI IN BOTH AEROBIC AND ANAEROBIC BOTTLES CRITICAL VALUE NOTED.  VALUE IS CONSISTENT WITH PREVIOUSLY REPORTED AND CALLED VALUE. Performed at [facility], [address] [place], [place] [address]    Culture [facility] POSITIVE COCCI  Final   Report Status PENDING  Incomplete  Blood Culture (routine x 2)     Status: None (Preliminary result)   Collection Time: 06/16/20  8:32 AM   Specimen: BLOOD  Result Value Ref Range Status   Specimen Description   Final    BLOOD LEFT FORE ARM Performed at [facility], [address] [place], [place] [address]    Special Requests   Final    BOTTLES DRAWN AEROBIC AND ANAEROBIC Blood Culture results may not be optimal due to an inadequate volume of blood received in culture bottles Performed at [facility], [address] [place], [place] [address]    Culture  Setup Time   Final    Organism ID to follow [place] AND ANAEROBIC BOTTLES CRITICAL RESULT CALLED TO, READ BACK BY AND VERIFIED WITH: DAVID [place] 06/16/20 AT 2148 HS Performed at [facility], [place] [address], [place], [place] [address]    Culture GRAM POSITIVE COCCI  Final   Report Status PENDING  Incomplete  Blood Culture ID Panel (Reflexed)     Status: Abnormal   Collection Time: 06/16/20  8:32 AM  Result Value Ref Range Status   Enterococcus faecalis NOT DETECTED NOT DETECTED Final   Enterococcus Faecium NOT DETECTED NOT DETECTED Final   Listeria  monocytogenes NOT DETECTED NOT DETECTED Final   Staphylococcus species NOT DETECTED NOT DETECTED Final   Staphylococcus aureus (BCID) NOT DETECTED NOT DETECTED Final   Staphylococcus epidermidis NOT DETECTED NOT DETECTED Final   Staphylococcus lugdunensis NOT DETECTED NOT DETECTED Final   Streptococcus species DETECTED (A) NOT DETECTED Final    Comment: CRITICAL RESULT CALLED TO, READ BACK BY AND VERIFIED WITH: DAVID BESANTI 06/16/20 AT 2148 HS    Streptococcus agalactiae NOT DETECTED NOT DETECTED Final   Streptococcus pneumoniae DETECTED (A) NOT DETECTED Final    Comment: CRITICAL RESULT CALLED TO, READ BACK BY AND VERIFIED WITH: DAVID BESANTI 06/16/20 AT 2148 HS    Streptococcus pyogenes NOT DETECTED NOT DETECTED Final   A.calcoaceticus-baumannii NOT DETECTED NOT DETECTED Final   Bacteroides fragilis NOT DETECTED NOT DETECTED Final   Enterobacterales NOT DETECTED NOT DETECTED Final   Enterobacter cloacae complex NOT DETECTED NOT DETECTED Final   Escherichia coli NOT DETECTED NOT DETECTED Final   Klebsiella aerogenes NOT DETECTED NOT DETECTED Final   Klebsiella oxytoca NOT DETECTED NOT DETECTED Final   Klebsiella pneumoniae NOT DETECTED NOT DETECTED Final   Proteus species NOT DETECTED NOT DETECTED Final   Salmonella species NOT DETECTED NOT DETECTED Final   Serratia  marcescens NOT DETECTED NOT DETECTED Final   Haemophilus influenzae NOT DETECTED NOT DETECTED Final   Neisseria meningitidis NOT DETECTED NOT DETECTED Final   Pseudomonas aeruginosa NOT DETECTED NOT DETECTED Final   Stenotrophomonas maltophilia NOT DETECTED NOT DETECTED Final   Candida albicans NOT DETECTED NOT DETECTED Final   Candida auris NOT DETECTED NOT DETECTED Final   Candida glabrata NOT DETECTED NOT DETECTED Final   Candida krusei NOT DETECTED NOT DETECTED Final   Candida parapsilosis NOT DETECTED NOT DETECTED Final   Candida tropicalis NOT DETECTED NOT DETECTED Final   Cryptococcus neoformans/gattii NOT  DETECTED NOT DETECTED Final    Comment: Performed at [facility], [address] [place], [place] [address]    Coagulation Studies: No results for input(s): LABPROT, INR in the last 72 hours.  Urinalysis: No results for input(s): COLORURINE, LABSPEC, PHURINE, GLUCOSEU, HGBUR, BILIRUBINUR, KETONESUR, PROTEINUR, UROBILINOGEN, NITRITE, LEUKOCYTESUR in the last 72 hours.  Invalid input(s): APPERANCEUR    Imaging: DG Chest Port 1 View  Result Date: 06/16/2020 CLINICAL DATA:  Shortness of breath with cough and chest pain EXAM: PORTABLE CHEST 1 VIEW COMPARISON:  None. FINDINGS: There is somewhat ill-defined airspace opacity in portions of the left mid lower lung regions. The right lung is clear. Heart is upper normal in size with pulmonary vascularity normal. No adenopathy. No bone lesions. IMPRESSION: Ill-defined airspace opacity left mid and lower lung regions consistent with multifocal pneumonia. Question atypical organism pneumonia given this appearance. Lungs elsewhere clear. Heart upper normal in size. No evident adenopathy. Electronically Signed   By: Lowella Grip III M.D.   On: 06/16/2020 09:21      Assessment & Plan: Pt is a 29 y.o. male with a PMHx of longstanding hypertension, ESRD not yet on dialysis, anemia of chronic kidney disease, secondary hyperparathyroidism, who was admitted to [facility] on 06/16/2020 for evaluation of shortness of breath and chest pain.   1.  ESRD not yet on dialysis.  Patient was last seen by nephrologist in [place] approximately 1 year ago.  He was told at that time that he needed renal placement therapy but does not yet have dialysis access given delays in care secondary to the pandemic.  I advised the patient that he clearly needs renal placement therapy now.  However patient states that he is only visiting [place] and would like to wait till he goes back to [place].  Counseled the patient against this as it may take significant time to  get in with a nephrologist and then to have dialysis access established.  Despite counseling however patient maintains his desire to start on dialysis once he goes back to [place].  For now continue supportive care.  Check renal ultrasound to further evaluate kidneys.  Also check ANA, ANCA, GBM antibodies, C3, C4, HIV screen, SPEP, UPEP.  2.  Hypertension.  Continue amlodipine, hydralazine, labetalol.  3.  Anemia of chronic kidney disease.  Hemoglobin 6.5.  He is a transplant candidate and would recommend trying to avoid blood products if possible.  Has been started on IV iron.  4.  Hyperkalemia.  To avoid further volume overload discontinue Lokelma in favor of Veltassa at this time.  5.  Thanks for consultation.

## 2020-06-17 NOTE — Progress Notes (Signed)
OT Cancellation Note  Patient Details Name: Daniel Combs MRN: [id number] DOB: 1990-12-07   Cancelled Treatment:    Reason Eval/Treat Not Completed: Medical issues which prohibited therapy. Chart reviewed - pt noted to have K+ critically high at 5.4 and with low Hgb (6.5) pending transfusion this date; contraindicated for exertional activity at this time. Will continue to follow and initiate services as pt medically appropriate to participate in therapy.    Dessie Coma, M.S. OTR/L  06/17/20, 9:54 AM  ascom [phone]

## 2020-06-17 NOTE — Progress Notes (Signed)
PHARMACY - PHYSICIAN COMMUNICATION CRITICAL VALUE ALERT - BLOOD CULTURE IDENTIFICATION (BCID)  Daniel Combs is an 29 y.o. male who presented to [facility] on 06/16/2020 with a chief complaint of SOB w/ CP and cough  Assessment:  Tachycardic, CRP 34.1, PCT > 150.0 (patient is ESRD not on dialysis?), WBC 16.2, HIV reactive, left shift w/ lymphopenia, CXR: IMPRESSION: Ill-defined airspace opacity left mid and lower lung regions consistent with multifocal pneumonia. Question atypical organism pneumonia given this appearance. Lungs elsewhere clear. Heart upper normal in size. No evident adenopathy.  2/4 GPC Streptococcus Pneumoniae   Name of physician (or Provider) Contacted: Dalphine Handing  Current antibiotics: Azithromycin, ceftriaxone  Changes to prescribed antibiotics recommended:  Patient is on recommended antibiotics - No changes needed  Results for orders placed or performed during the hospital encounter of 06/16/20  Blood Culture ID Panel (Reflexed) (Collected: 06/16/2020  8:32 AM)  Result Value Ref Range   Enterococcus faecalis NOT DETECTED NOT DETECTED   Enterococcus Faecium NOT DETECTED NOT DETECTED   Listeria monocytogenes NOT DETECTED NOT DETECTED   Staphylococcus species NOT DETECTED NOT DETECTED   Staphylococcus aureus (BCID) NOT DETECTED NOT DETECTED   Staphylococcus epidermidis NOT DETECTED NOT DETECTED   Staphylococcus lugdunensis NOT DETECTED NOT DETECTED   Streptococcus species DETECTED (A) NOT DETECTED   Streptococcus agalactiae NOT DETECTED NOT DETECTED   Streptococcus pneumoniae DETECTED (A) NOT DETECTED   Streptococcus pyogenes NOT DETECTED NOT DETECTED   A.calcoaceticus-baumannii NOT DETECTED NOT DETECTED   Bacteroides fragilis NOT DETECTED NOT DETECTED   Enterobacterales NOT DETECTED NOT DETECTED   Enterobacter cloacae complex NOT DETECTED NOT DETECTED   Escherichia coli NOT DETECTED NOT DETECTED   Klebsiella aerogenes NOT DETECTED NOT DETECTED    Klebsiella oxytoca NOT DETECTED NOT DETECTED   Klebsiella pneumoniae NOT DETECTED NOT DETECTED   Proteus species NOT DETECTED NOT DETECTED   Salmonella species NOT DETECTED NOT DETECTED   Serratia marcescens NOT DETECTED NOT DETECTED   Haemophilus influenzae NOT DETECTED NOT DETECTED   Neisseria meningitidis NOT DETECTED NOT DETECTED   Pseudomonas aeruginosa NOT DETECTED NOT DETECTED   Stenotrophomonas maltophilia NOT DETECTED NOT DETECTED   Candida albicans NOT DETECTED NOT DETECTED   Candida auris NOT DETECTED NOT DETECTED   Candida glabrata NOT DETECTED NOT DETECTED   Candida krusei NOT DETECTED NOT DETECTED   Candida parapsilosis NOT DETECTED NOT DETECTED   Candida tropicalis NOT DETECTED NOT DETECTED   Cryptococcus neoformans/gattii NOT DETECTED NOT DETECTED   Tobie Lords, PharmD, BCPS Clinical Pharmacist 06/17/2020  2:26 AM

## 2020-06-17 NOTE — Progress Notes (Addendum)
PROGRESS NOTE    Daniel Combs  ZOX:[id number] DOB: [birth year] DOA: 06/16/2020 PCP: System, Pcp Not In   Assessment & Plan:   Principal Problem:   CAP (community acquired pneumonia) Active Problems:   Hypertension   ESRD (end stage renal disease) ([place])   Sepsis ([place])   Hyponatremia   Hyperkalemia   Elevated troponin   Anemia in ESRD (end-stage renal disease) ([place])   Sepsis: secondary to CAP. Meets criteria for sepsis with leukocytosis, tachycardia, tachypnea.  Continue on IV rocephin, azithromycin. Legionella, strep are pending. Continue on bronchodilators and encourage incentive spirometry. Blood cxs are pending   Hypertension: continue on home dose of amlodipine. IV hydralazine   ESRD: Cr continues to trend up, not on HD. Nephro consulted   HIV: ? Compliance w/ anti-retroviral medications. ID consulted   Thrombocytopenia: likely secondary to HIV. Will continue to monitor   Likely ACD: will transfuse 1 unit of pRBCs today. Repeat H&H 4 hours post transfusion. Will start IV iron as iron is severely low. Will continue to monitor   Hyponatremia: likely secondary to dehydration. Continue on IVFs  Hyperkalemia: s/p D50, insulin, bicarb & lokelma.  Repeat lokelma x 1. Secondary to ESRD  Elevated troponins: likely secondary to demand ischemia & ESRD    DVT prophylaxis: SCDs Code Status: full  Family Communication:  Disposition Plan: depends on PT/OT recs. Pt lives in [place] but in town visiting his mother as per pt    Consultants:   nephro  ID    Procedures:    Antimicrobials:    Subjective: Pt c/o shortness of breath and cough.  Objective: Vitals:   06/16/20 2228 06/17/20 0003 06/17/20 0029 06/17/20 0415  BP:  (!) 155/106  (!) 157/104  Pulse:  (!) 111 (!) 109 (!) 108  Resp:  19  19  Temp:  97.7 F (36.5 C)  97.9 F (36.6 C)  TempSrc:  Oral  Oral  SpO2:  98% 100% 100%  Weight: 60.8 kg     Height: 5\' 6"  (1.676 m)       Intake/Output Summary  (Last 24 hours) at 06/17/2020 1229 Last data filed at 06/17/2020 0850 Gross per 24 hour  Intake 1120 ml  Output 250 ml  Net 870 ml   Filed Weights   06/16/20 2228  Weight: 60.8 kg    Examination:  General exam: Appears calm but uncomfortable  Respiratory system: course breath sounds b/l Cardiovascular system: S1 & S2 + No rubs, gallops or clicks.  Gastrointestinal system: Abdomen is nondistended, soft and nontender. Normal bowel sounds heard. Central nervous system: Alert and oriented. Moves all 4 extremities  Psychiatry: Judgement and insight appear normal. Flat mood and affect.     Data Reviewed: I have personally reviewed following labs and imaging studies  CBC: Recent Labs  Lab 06/16/20 0832 06/17/20 0540  WBC 16.2* 10.8*  NEUTROABS 14.7*  --   HGB 7.4* 6.5*  HCT 22.3* 19.3*  MCV 78.5* 78.1*  PLT 168 811*   Basic Metabolic Panel: Recent Labs  Lab 06/16/20 0832 06/16/20 1331 06/16/20 2031 06/17/20 0540  NA 128* 131* 132* 130*  K 5.7* 4.8 5.0 5.4*  CL 99 102 102 101  CO2 12* 15* 16* 13*  GLUCOSE 110* 186* 108* 88  BUN 134* 134* 131* 145*  CREATININE 16.68* 15.34* 15.38* 16.44*  CALCIUM 8.4* 7.5* 7.7* 7.7*   GFR: Estimated Creatinine Clearance: 5.7 mL/min (A) (by C-G formula based on SCr of 16.44 mg/dL (H)). Liver Function Tests: Recent Labs  Lab 06/16/20 0832  AST 15  ALT 30  ALKPHOS 63  BILITOT 0.7  PROT 8.5*  ALBUMIN 3.2*   No results for input(s): LIPASE, AMYLASE in the last 168 hours. No results for input(s): AMMONIA in the last 168 hours. Coagulation Profile: No results for input(s): INR, PROTIME in the last 168 hours. Cardiac Enzymes: No results for input(s): CKTOTAL, CKMB, CKMBINDEX, TROPONINI in the last 168 hours. BNP (last 3 results) No results for input(s): PROBNP in the last 8760 hours. HbA1C: Recent Labs    06/17/20 0540  HGBA1C 4.9   CBG: No results for input(s): GLUCAP in the last 168 hours. Lipid Profile: Recent Labs      06/16/20 0832 06/17/20 0540  CHOL  --  94  HDL  --  24*  LDLCALC  --  40  TRIG 135 151*  CHOLHDL  --  3.9   Thyroid Function Tests: No results for input(s): TSH, T4TOTAL, FREET4, T3FREE, THYROIDAB in the last 72 hours. Anemia Panel: Recent Labs    06/16/20 0832 06/17/20 0002  VITAMINB12  --  1,376*  FOLATE  --  7.9  FERRITIN 192 200  TIBC  --  119*  IRON  --  11*  RETICCTPCT  --  1.9   Sepsis Labs: Recent Labs  Lab 06/16/20 0832 06/16/20 1204  PROCALCITON >150.00  --   LATICACIDVEN 0.9 0.7    Recent Results (from the past 240 hour(s))  SARS Coronavirus 2 by RT PCR (hospital order, performed in [facility] hospital lab) Nasopharyngeal Nasopharyngeal Swab     Status: None   Collection Time: 06/16/20  8:32 AM   Specimen: Nasopharyngeal Swab  Result Value Ref Range Status   SARS Coronavirus 2 NEGATIVE NEGATIVE Final    Comment: (NOTE) SARS-CoV-2 target nucleic acids are NOT DETECTED.  The SARS-CoV-2 RNA is generally detectable in upper and lower respiratory specimens during the acute phase of infection. The lowest concentration of SARS-CoV-2 viral copies this assay can detect is 250 copies / mL. A negative result does not preclude SARS-CoV-2 infection and should not be used as the sole basis for treatment or other patient management decisions.  A negative result may occur with improper specimen collection / handling, submission of specimen other than nasopharyngeal swab, presence of viral mutation(s) within the areas targeted by this assay, and inadequate number of viral copies (<250 copies / mL). A negative result must be combined with clinical observations, patient history, and epidemiological information.  Fact Sheet for Patients:   StrictlyIdeas.no  Fact Sheet for Healthcare Providers: BankingDealers.co.za  This test is not yet approved or  cleared by the [place] FDA and has been authorized for  detection and/or diagnosis of SARS-CoV-2 by FDA under an Emergency Use Authorization (EUA).  This EUA will remain in effect (meaning this test can be used) for the duration of the COVID-19 declaration under Section 564(b)(1) of the Act, 21 U.S.C. section 360bbb-3(b)(1), unless the authorization is terminated or revoked sooner.  Performed at [facility], [address] [place], [place] [address]   Blood Culture (routine x 2)     Status: None (Preliminary result)   Collection Time: 06/16/20  8:32 AM   Specimen: BLOOD  Result Value Ref Range Status   Specimen Description BLOOD LEFT FORE ARM  Final   Special Requests   Final    BOTTLES DRAWN AEROBIC AND ANAEROBIC Blood Culture results may not be optimal due to an inadequate volume of blood received in culture bottles  Culture  Setup Time   Final    GRAM POSITIVE COCCI IN BOTH AEROBIC AND ANAEROBIC BOTTLES CRITICAL VALUE NOTED.  VALUE IS CONSISTENT WITH PREVIOUSLY REPORTED AND CALLED VALUE. Performed at [facility], [address] [place], [place] [address]    Culture [facility] POSITIVE COCCI  Final   Report Status PENDING  Incomplete  Blood Culture (routine x 2)     Status: None (Preliminary result)   Collection Time: 06/16/20  8:32 AM   Specimen: BLOOD  Result Value Ref Range Status   Specimen Description   Final    BLOOD LEFT FORE ARM Performed at [facility], [address], [place], [place] [address]    Special Requests   Final    BOTTLES DRAWN AEROBIC AND ANAEROBIC Blood Culture results may not be optimal due to an inadequate volume of blood received in culture bottles Performed at [facility], [address] [place], [place] [address]    Culture  Setup Time   Final    Organism ID to follow [place] AND ANAEROBIC BOTTLES CRITICAL RESULT CALLED TO, READ BACK BY AND VERIFIED WITH: DAVID Bazile [place] 06/16/20 AT 2148 HS Performed at [facility], [address], [place], [place] [address]    Culture GRAM POSITIVE COCCI  Final   Report Status PENDING  Incomplete  Blood Culture ID Panel (Reflexed)     Status: Abnormal   Collection Time: 06/16/20  8:32 AM  Result Value Ref Range Status   Enterococcus faecalis NOT DETECTED NOT DETECTED Final   Enterococcus Faecium NOT DETECTED NOT DETECTED Final   Listeria monocytogenes NOT DETECTED NOT DETECTED Final   Staphylococcus species NOT DETECTED NOT DETECTED Final   Staphylococcus aureus (BCID) NOT DETECTED NOT DETECTED Final   Staphylococcus epidermidis NOT DETECTED NOT DETECTED Final   Staphylococcus lugdunensis NOT DETECTED NOT DETECTED Final   Streptococcus species DETECTED (A) NOT DETECTED Final    Comment: CRITICAL RESULT CALLED TO, READ BACK BY AND VERIFIED WITH: DAVID BESANTI 06/16/20 AT 2148 HS    Streptococcus agalactiae NOT DETECTED NOT DETECTED Final   Streptococcus pneumoniae DETECTED (A) NOT DETECTED Final    Comment: CRITICAL RESULT CALLED TO, READ BACK BY AND VERIFIED WITH: DAVID BESANTI 06/16/20 AT 2148 HS    Streptococcus pyogenes NOT DETECTED NOT DETECTED Final   A.calcoaceticus-baumannii NOT DETECTED NOT DETECTED Final   Bacteroides fragilis NOT DETECTED NOT DETECTED Final   Enterobacterales NOT DETECTED NOT DETECTED Final   Enterobacter cloacae complex NOT DETECTED NOT DETECTED Final   Escherichia coli NOT DETECTED NOT DETECTED Final   Klebsiella aerogenes NOT DETECTED NOT DETECTED Final   Klebsiella oxytoca NOT DETECTED NOT DETECTED Final   Klebsiella pneumoniae NOT DETECTED NOT DETECTED Final   Proteus species NOT DETECTED NOT DETECTED Final   Salmonella species NOT DETECTED NOT DETECTED Final   Serratia marcescens NOT DETECTED NOT DETECTED Final   Haemophilus influenzae NOT DETECTED NOT DETECTED Final   Neisseria meningitidis NOT DETECTED NOT DETECTED Final   Pseudomonas aeruginosa NOT DETECTED NOT DETECTED Final   Stenotrophomonas maltophilia NOT DETECTED NOT DETECTED  Final   Candida albicans NOT DETECTED NOT DETECTED Final   Candida auris NOT DETECTED NOT DETECTED Final   Candida glabrata NOT DETECTED NOT DETECTED Final   Candida krusei NOT DETECTED NOT DETECTED Final   Candida parapsilosis NOT DETECTED NOT DETECTED Final   Candida tropicalis NOT DETECTED NOT DETECTED Final   Cryptococcus neoformans/gattii NOT DETECTED NOT  DETECTED Final    Comment: Performed at [facility], [address], [place], [place] [address]         Radiology Studies: [facility] Chest [place] 1 View  Result Date: 06/16/2020 CLINICAL DATA:  Shortness of breath with cough and chest pain EXAM: PORTABLE CHEST 1 VIEW COMPARISON:  None. FINDINGS: There is somewhat ill-defined airspace opacity in portions of the left mid lower lung regions. The right lung is clear. Heart is upper normal in size with pulmonary vascularity normal. No adenopathy. No bone lesions. IMPRESSION: Ill-defined airspace opacity left mid and lower lung regions consistent with multifocal pneumonia. Question atypical organism pneumonia given this appearance. Lungs elsewhere clear. Heart upper normal in size. No evident adenopathy. Electronically Signed   By: Lowella Grip III M.D.   On: 06/16/2020 09:21        Scheduled Meds: . sodium chloride   Intravenous Once  . amLODipine  5 mg Oral Daily  . aspirin EC  81 mg Oral Daily  . heparin  5,000 Units Subcutaneous Q8H  . pneumococcal 23 valent vaccine  0.5 mL Intramuscular Tomorrow-1000  . sodium zirconium cyclosilicate  10 g Oral Once  . sodium zirconium cyclosilicate  10 g Oral Once   Continuous Infusions: . azithromycin Stopped (06/16/20 1326)  . cefTRIAXone (ROCEPHIN)  IV 2 g (06/17/20 0850)     LOS: 1 day    Time spent: 35 mins     Wyvonnia Dusky, MD [facility] Pager [phone]  If 7PM-7AM, please contact night-coverage [email or web address] 06/17/2020, 12:29 PM

## 2020-06-17 NOTE — Progress Notes (Signed)
Pt HGB 6.5. Primary nurse notified NP Darnell Level. Orders received to transfuse. Pt consent signed with all questions answered.

## 2020-06-18 ENCOUNTER — Encounter: Payer: Self-pay | Admitting: Vascular Surgery

## 2020-06-18 ENCOUNTER — Inpatient Hospital Stay
Admission: EM | Disposition: A | Discharge status: Home / Self Care | Payer: Self-pay | Source: Home / Self Care | Attending: Internal Medicine

## 2020-06-18 ENCOUNTER — Other Ambulatory Visit (INDEPENDENT_AMBULATORY_CARE_PROVIDER_SITE_OTHER): Payer: Self-pay | Admitting: Vascular Surgery

## 2020-06-18 DIAGNOSIS — N185 Chronic kidney disease, stage 5: Secondary | ICD-10-CM

## 2020-06-18 DIAGNOSIS — R7881 Bacteremia: Secondary | ICD-10-CM

## 2020-06-18 HISTORY — PX: DIALYSIS/PERMA CATHETER INSERTION: CATH118288

## 2020-06-18 LAB — CBC
HCT: 26 % — ABNORMAL LOW (ref 39.0–52.0)
Hemoglobin: 9.1 g/dL — ABNORMAL LOW (ref 13.0–17.0)
MCH: 26.8 pg (ref 26.0–34.0)
MCHC: 35 g/dL (ref 30.0–36.0)
MCV: 76.5 fL — ABNORMAL LOW (ref 80.0–100.0)
Platelets: 180 10*3/uL (ref 150–400)
RBC: 3.4 MIL/uL — ABNORMAL LOW (ref 4.22–5.81)
RDW: 17.2 % — ABNORMAL HIGH (ref 11.5–15.5)
WBC: 10.3 10*3/uL (ref 4.0–10.5)
nRBC: 0.7 % — ABNORMAL HIGH (ref 0.0–0.2)

## 2020-06-18 LAB — PROTEIN / CREATININE RATIO, URINE
Creatinine, Urine: 116 mg/dL
Protein Creatinine Ratio: 2.13 mg/mg{Cre} — ABNORMAL HIGH (ref 0.00–0.15)
Total Protein, Urine: 247 mg/dL

## 2020-06-18 LAB — HIV-1/2 AB - DIFFERENTIATION
HIV 1 Ab: POSITIVE — AB
HIV 2 Ab: NEGATIVE

## 2020-06-18 LAB — TYPE AND SCREEN
ABO/RH(D): A POS
Antibody Screen: NEGATIVE
Unit division: 0

## 2020-06-18 LAB — BPAM RBC
Blood Product Expiration Date: 202110072359
ISSUE DATE / TIME: 202109121750
Unit Type and Rh: 6200

## 2020-06-18 LAB — HEPATITIS PANEL, ACUTE
HCV Ab: NONREACTIVE
Hep A IgM: NONREACTIVE
Hep B C IgM: NONREACTIVE
Hepatitis B Surface Ag: NONREACTIVE

## 2020-06-18 LAB — LEGIONELLA PNEUMOPHILA SEROGP 1 UR AG: L. pneumophila Serogp 1 Ur Ag: NEGATIVE

## 2020-06-18 LAB — BASIC METABOLIC PANEL
Anion gap: 18 — ABNORMAL HIGH (ref 5–15)
BUN: 127 mg/dL — ABNORMAL HIGH (ref 6–20)
CO2: 16 mmol/L — ABNORMAL LOW (ref 22–32)
Calcium: 7.7 mg/dL — ABNORMAL LOW (ref 8.9–10.3)
Chloride: 98 mmol/L (ref 98–111)
Creatinine, Ser: 16.42 mg/dL — ABNORMAL HIGH (ref 0.61–1.24)
GFR calc Af Amer: 4 mL/min — ABNORMAL LOW (ref >60)
GFR calc non Af Amer: 3 mL/min — ABNORMAL LOW (ref >60)
Glucose, Bld: 102 mg/dL — ABNORMAL HIGH (ref 70–99)
Potassium: 4.5 mmol/L (ref 3.5–5.1)
Sodium: 132 mmol/L — ABNORMAL LOW (ref 135–145)

## 2020-06-18 SURGERY — DIALYSIS/PERMA CATHETER INSERTION
Anesthesia: Moderate Sedation

## 2020-06-18 MED ORDER — FAMOTIDINE 20 MG PO TABS: 40.0000 mg | ORAL_TABLET | Freq: Once | ORAL | Status: DC | PRN

## 2020-06-18 MED ORDER — CHLORHEXIDINE GLUCONATE CLOTH 2 % EX PADS
6.0000 | MEDICATED_PAD | Freq: Every day | CUTANEOUS | Status: DC
  Administered 2020-06-18 – 2020-07-06 (×13): 6 via TOPICAL

## 2020-06-18 MED ORDER — HYDRALAZINE HCL 50 MG PO TABS
50.0000 mg | ORAL_TABLET | Freq: Three times a day (TID) | ORAL | Status: DC
  Administered 2020-06-18 – 2020-07-05 (×38): 50 mg via ORAL
  Filled 2020-06-18 (×40): qty 1

## 2020-06-18 MED ORDER — METHYLPREDNISOLONE SODIUM SUCC 125 MG IJ SOLR: 125.0000 mg | Freq: Once | INTRAMUSCULAR | Status: DC | PRN

## 2020-06-18 MED ORDER — HEPARIN (PORCINE) IN NACL 2000-0.9 UNIT/L-% IV SOLN
INTRAVENOUS | Status: DC | PRN
  Administered 2020-06-18: 500 mL

## 2020-06-18 MED ORDER — MIDAZOLAM HCL 2 MG/2ML IJ SOLN
INTRAMUSCULAR | Status: DC | PRN
  Administered 2020-06-18: 1 mg via INTRAVENOUS
  Administered 2020-06-18: 2 mg via INTRAVENOUS

## 2020-06-18 MED ORDER — HYDROMORPHONE HCL 1 MG/ML IJ SOLN: 1.0000 mg | Freq: Once | INTRAMUSCULAR | Status: DC | PRN

## 2020-06-18 MED ORDER — FENTANYL CITRATE (PF) 100 MCG/2ML IJ SOLN
INTRAMUSCULAR | Status: AC
  Filled 2020-06-18: qty 2

## 2020-06-18 MED ORDER — DIPHENHYDRAMINE HCL 50 MG/ML IJ SOLN: 50.0000 mg | Freq: Once | INTRAMUSCULAR | Status: DC | PRN

## 2020-06-18 MED ORDER — PROMETHAZINE HCL 25 MG/ML IJ SOLN: 12.5000 mg | Freq: Four times a day (QID) | INTRAMUSCULAR | Status: DC | PRN

## 2020-06-18 MED ORDER — CARVEDILOL 12.5 MG PO TABS
12.5000 mg | ORAL_TABLET | Freq: Two times a day (BID) | ORAL | Status: DC
  Administered 2020-06-18 – 2020-06-19 (×3): 12.5 mg via ORAL
  Filled 2020-06-18 (×4): qty 1

## 2020-06-18 MED ORDER — FENTANYL CITRATE (PF) 100 MCG/2ML IJ SOLN
INTRAMUSCULAR | Status: DC | PRN
  Administered 2020-06-18: 50 ug via INTRAVENOUS
  Administered 2020-06-18: 25 ug via INTRAVENOUS

## 2020-06-18 MED ORDER — MIDAZOLAM HCL 2 MG/ML PO SYRP: 8.0000 mg | ORAL_SOLUTION | Freq: Once | ORAL | Status: DC | PRN

## 2020-06-18 MED ORDER — SODIUM CHLORIDE 0.9 % IV SOLN: INTRAVENOUS | Status: DC

## 2020-06-18 MED ORDER — MIDAZOLAM HCL 5 MG/5ML IJ SOLN
INTRAMUSCULAR | Status: AC
  Filled 2020-06-18: qty 5

## 2020-06-18 MED ORDER — ONDANSETRON HCL 4 MG/2ML IJ SOLN
4.0000 mg | Freq: Four times a day (QID) | INTRAMUSCULAR | Status: DC | PRN
  Administered 2020-06-21: 4 mg via INTRAVENOUS

## 2020-06-18 SURGICAL SUPPLY — 7 items
CATH CANNON HEMO 15FR 19 (HEMODIALYSIS SUPPLIES) ×2 IMPLANT
COVER PROBE U/S 5X48 (MISCELLANEOUS) ×2 IMPLANT
DERMABOND ADVANCED (GAUZE/BANDAGES/DRESSINGS) ×1
DERMABOND ADVANCED .7 DNX12 (GAUZE/BANDAGES/DRESSINGS) ×1 IMPLANT
PACK ANGIOGRAPHY (CUSTOM PROCEDURE TRAY) ×2 IMPLANT
SUT MNCRL AB 4-0 PS2 18 (SUTURE) ×2 IMPLANT
SUT PROLENE 0 CT 1 30 (SUTURE) ×2 IMPLANT

## 2020-06-18 NOTE — Progress Notes (Signed)
OT Cancellation Note  Patient Details Name: Daniel Combs MRN: [id number] DOB: [birth year]   Cancelled Treatment:     Pt refused, too "tired and dizzy" to participate. Will try at a later time/day as schedule permits.  [name], PhD, [place], OTR/L ascom [phone] 06/18/20, 10:25 AM

## 2020-06-18 NOTE — Consult Note (Signed)
NAME: Daniel Combs  DOB: 22-Apr-1991  MRN: [id number]  Date/Time: 06/18/2020 12:07 PM  REQUESTING PROVIDER: Dr. Jimmye Norman Subjective:  REASON FOR CONSULT: HIV ? Daniel Combs is a 29 y.o. male with a history of hypertension, CKD and HIV is admitted on 06/16/2020 with cough and back pain of 1 week duration. Patient has not been taking his antihypertensives the past few months. In the ED vitals were temperature of 98.9, BP of 171/135, HR of 119, pulse ox of 98%, and respiratory rate of 23. Labs revealed a potassium of 5.7, creatinine of 16.68, BNP of 452, LDH of 227, troponin of 145, pro-Cal of more than 150, CRP of 34, WBC of 16.2, hemoglobin of 7.4 and platelet of 168.  Blood cultures were sent. Chest x-ray showed ill-defined opacity left mid and lower lung regions consistent with multifocal pneumonia.  Patient was started on ceftriaxone and azithromycin. Blood culture positive for pneumococcus. I am seeing the patient for HIV. Patient had in 2018 admission to [facility] with a hypertensive emergency On 03/20/2020 his creatinine was 11 and BUN was 105 with potassium of 5.9.  Hemoglobin at time was 8.2 Pt says he was diagnosed with HIV 6 years ago He gets treatment in [place] non compliant He has been on intermittent Triumeq Here in [place] visiting his mother Has had cough and rt sided back pain for 2-3 days- fever+ Denies Ois Does not know his cd4 count orVl  Past Medical History:  Diagnosis Date  . Hypertension     History reviewed. No pertinent surgical history.  Social History   Socioeconomic History  . Marital status: Unknown    Spouse name: Not on file  . Number of children: Not on file  . Years of education: Not on file  . Highest education level: Not on file  Occupational History  . Not on file  Tobacco Use  . Smoking status: Current Every Day Smoker  . Smokeless tobacco: Never Used  Substance and Sexual Activity  . Alcohol use:  Not Currently  . Drug use: Never  . Sexual activity: Not on file  Other Topics Concern  . Not on file  Social History Narrative  . Not on file   Social Determinants of Health   Financial Resource Strain:   . Difficulty of Paying Living Expenses: Not on file  Food Insecurity:   . Worried About Charity fundraiser in the Last Year: Not on file  . Ran Out of Food in the Last Year: Not on file  Transportation Needs:   . Lack of Transportation (Medical): Not on file  . Lack of Transportation (Non-Medical): Not on file  Physical Activity:   . Days of Exercise per Week: Not on file  . Minutes of Exercise per Session: Not on file  Stress:   . Feeling of Stress : Not on file  Social Connections:   . Frequency of Communication with Friends and Family: Not on file  . Frequency of Social Gatherings with Friends and Family: Not on file  . Attends Religious Services: Not on file  . Active Member of Clubs or Organizations: Not on file  . Attends Archivist Meetings: Not on file  . Marital Status: Not on file  Intimate Partner Violence:   . Fear of Current or Ex-Partner: Not on file  . Emotionally Abused: Not on file  . Physically Abused: Not on file  . Sexually Abused: Not on file    Family History  Problem Relation Age of Onset  . Diabetes Mellitus II Mother   . Hypertension Mother    No Known Allergies  ? Current Facility-Administered Medications  Medication Dose Route Frequency Provider Last Rate Last Admin  . 0.9 %  sodium chloride infusion (Manually program via Guardrails IV Fluids)   Intravenous Once Sharion Settler, NP      . acetaminophen (TYLENOL) tablet 650 mg  650 mg Oral Q6H PRN Ivor Costa, MD   650 mg at 06/16/20 2056  . amLODipine (NORVASC) tablet 10 mg  10 mg Oral Daily Wyvonnia Dusky, MD   10 mg at 06/18/20 0906  . aspirin EC tablet 81 mg  81 mg Oral Daily Ivor Costa, MD   81 mg at 06/18/20 0905  . azithromycin (ZITHROMAX) 500 mg in sodium chloride  0.9 % 250 mL IVPB  500 mg Intravenous Q24H Ivor Costa, MD 250 mL/hr at 06/18/20 0809 500 mg at 06/18/20 0809  . carvedilol (COREG) tablet 12.5 mg  12.5 mg Oral BID WC Lateef, Munsoor, MD   12.5 mg at 06/18/20 0906  . cefTRIAXone (ROCEPHIN) 2 g in sodium chloride 0.9 % 100 mL IVPB  2 g Intravenous Q24H Ivor Costa, MD 200 mL/hr at 06/17/20 0850 2 g at 06/17/20 0850  . Chlorhexidine Gluconate Cloth 2 % PADS 6 each  6 each Topical Q0600 Lateef, Munsoor, MD      . chlorpheniramine-HYDROcodone (TUSSIONEX) 10-8 MG/5ML suspension 5 mL  5 mL Oral QHS PRN Wyvonnia Dusky, MD   5 mL at 06/17/20 2259  . heparin injection 5,000 Units  5,000 Units Subcutaneous Q8H Ivor Costa, MD   5,000 Units at 06/18/20 0545  . hydrALAZINE (APRESOLINE) injection 10 mg  10 mg Intravenous Q4H PRN Sharion Settler, NP   10 mg at 06/18/20 2671  . hydrALAZINE (APRESOLINE) tablet 50 mg  50 mg Oral Q8H Lateef, Munsoor, MD   50 mg at 06/18/20 0905  . iron sucrose (VENOFER) 150 mg in sodium chloride 0.9 % 150 mL IVPB  150 mg Intravenous Q24H Wyvonnia Dusky, MD 157.5 mL/hr at 06/17/20 1441 150 mg at 06/17/20 1441  . labetalol (NORMODYNE) injection 10 mg  10 mg Intravenous Q4H PRN Sharion Settler, NP   10 mg at 06/18/20 0859  . morphine 2 MG/ML injection 1 mg  1 mg Intravenous Q3H PRN Wyvonnia Dusky, MD   1 mg at 06/17/20 1301  . ondansetron (ZOFRAN) injection 4 mg  4 mg Intravenous Q8H PRN Ivor Costa, MD   4 mg at 06/18/20 0803  . oxyCODONE-acetaminophen (PERCOCET) 7.5-325 MG per tablet 1 tablet  1 tablet Oral Q6H PRN Wyvonnia Dusky, MD      . patiromer [facility]) packet 16.8 g  16.8 g Oral Daily Lateef, Munsoor, MD   16.8 g at 06/17/20 1759  . promethazine (PHENERGAN) injection 12.5 mg  12.5 mg Intravenous Q6H PRN Wyvonnia Dusky, MD         Abtx:  Anti-infectives (From admission, onward)   Start     Dose/Rate Route Frequency Ordered Stop   06/16/20 1130  cefTRIAXone (ROCEPHIN) 2 g in sodium chloride 0.9 % 100  mL IVPB        2 g 200 mL/hr over 30 Minutes Intravenous Every 24 hours 06/16/20 1123     06/16/20 1130  azithromycin (ZITHROMAX) 500 mg in sodium chloride 0.9 % 250 mL IVPB        500 mg 250 mL/hr over 60 Minutes Intravenous Every 24  hours 06/16/20 1123        REVIEW OF SYSTEMS:  Const:  fever,  chills, negative weight loss Eyes: negative diplopia or visual changes, negative eye pain ENT: negative coryza, negative sore throat Resp:cough, no hemoptysis, dyspnea Cards: rt sided  chest pain, no palpitations, lower extremity edema GU: negative for frequency, dysuria and hematuria GI: Negative for abdominal pain, diarrhea, bleeding, constipation Skin: negative for rash and pruritus Heme: negative for easy bruising and gum/nose bleeding PQ:[id number] body ache Neurolo:++ headaches, dizziness, vertigo, memory problems  Psych: negative for feelings of anxiety, depression  Endocrine: negative for thyroid, diabetes Allergy/Immunology- negative for any medication or food allergies ?  Objective:  VITALS:  BP (!) 156/117   Pulse (!) 110   Temp 98.2 F (36.8 C) (Oral)   Resp 16   Ht 5\' 6"  (1.676 m)   Wt 60.8 kg   SpO2 99%   BMI 21.63 kg/m  PHYSICAL EXAM:  General: Alert, cooperative, no distress, appears stated age. Getting dialysis- chronically ill Head: Normocephalic, without obvious abnormality, atraumatic. Eyes: Conjunctivae clear, anicteric sclerae. Pupils are equal ENT Nares normal. No drainage or sinus tenderness. Lips, mucosa, and tongue normal. No Thrush Neck: Supple, symmetrical, no adenopathy, thyroid: non tender no carotid bruit and no JVD. Back: No CVA tenderness. Lungs: b/la ir entry- rhochi rt > left Heart: Tachycardia Abdomen: Soft, non-tender,not distended. Bowel sounds normal. No masses Extremities: atraumatic, no cyanosis. No edema. No clubbing Skin: No rashes or lesions. Or bruising Lymph: Cervical, supraclavicular normal. Neurologic: Grossly  non-focal Pertinent Labs Lab Results CBC    Component Value Date/Time   WBC 10.3 06/18/2020 0655   RBC 3.40 (L) 06/18/2020 0655   HGB 9.1 (L) 06/18/2020 0655   HCT 26.0 (L) 06/18/2020 0655   PLT 180 06/18/2020 0655   MCV 76.5 (L) 06/18/2020 0655   MCH 26.8 06/18/2020 0655   MCHC 35.0 06/18/2020 0655   RDW 17.2 (H) 06/18/2020 0655   LYMPHSABS 0.6 (L) 06/16/2020 0832   MONOABS 0.5 06/16/2020 0832   EOSABS 0.0 06/16/2020 0832   BASOSABS 0.0 06/16/2020 0832    CMP Latest Ref Rng & Units 06/18/2020 06/17/2020 06/16/2020  Glucose 70 - 99 mg/dL 102(H) 88 108(H)  BUN 6 - 20 mg/dL 127(H) 145(H) 131(H)  Creatinine 0.61 - 1.24 mg/dL 16.42(H) 16.44(H) 15.38(H)  Sodium 135 - 145 mmol/L 132(L) 130(L) 132(L)  Potassium 3.5 - 5.1 mmol/L 4.5 5.4(H) 5.0  Chloride 98 - 111 mmol/L 98 101 102  CO2 22 - 32 mmol/L 16(L) 13(L) 16(L)  Calcium 8.9 - 10.3 mg/dL 7.7(L) 7.7(L) 7.7(L)  Total Protein 6.5 - 8.1 g/dL - - -  Total Bilirubin 0.3 - 1.2 mg/dL - - -  Alkaline Phos 38 - 126 U/L - - -  AST 15 - 41 U/L - - -  ALT 0 - 44 U/L - - -      Microbiology: Recent Results (from the past 240 hour(s))  SARS Coronavirus 2 by RT PCR (hospital order, performed in [facility] hospital lab) Nasopharyngeal Nasopharyngeal Swab     Status: None   Collection Time: 06/16/20  8:32 AM   Specimen: Nasopharyngeal Swab  Result Value Ref Range Status   SARS Coronavirus 2 NEGATIVE NEGATIVE Final    Comment: (NOTE) SARS-CoV-2 target nucleic acids are NOT DETECTED.  The SARS-CoV-2 RNA is generally detectable in upper and lower respiratory specimens during the acute phase of infection. The lowest concentration of SARS-CoV-2 viral copies this assay can detect is 250 copies /  mL. A negative result does not preclude SARS-CoV-2 infection and should not be used as the sole basis for treatment or other patient management decisions.  A negative result may occur with improper specimen collection / handling, submission of  specimen other than nasopharyngeal swab, presence of viral mutation(s) within the areas targeted by this assay, and inadequate number of viral copies (<250 copies / mL). A negative result must be combined with clinical observations, patient history, and epidemiological information.  Fact Sheet for Patients:   StrictlyIdeas.no  Fact Sheet for Healthcare Providers: BankingDealers.co.za  This test is not yet approved or  cleared by the [place] FDA and has been authorized for detection and/or diagnosis of SARS-CoV-2 by FDA under an Emergency Use Authorization (EUA).  This EUA will remain in effect (meaning this test can be used) for the duration of the COVID-19 declaration under Section 564(b)(1) of the Act, 21 U.S.C. section 360bbb-3(b)(1), unless the authorization is terminated or revoked sooner.  Performed at [facility], [address], [place], [place] [address]   Blood Culture (routine x 2)     Status: Abnormal (Preliminary result)   Collection Time: 06/16/20  8:32 AM   Specimen: BLOOD  Result Value Ref Range Status   Specimen Description   Final    BLOOD LEFT FORE ARM Performed at [facility], [address] [place], [place] [address]    Special Requests   Final    BOTTLES DRAWN AEROBIC AND ANAEROBIC Blood Culture results may not be optimal due to an inadequate volume of blood received in culture bottles Performed at [facility], [address] [place], [place] [address]    Culture  Setup Time   Final    GRAM POSITIVE COCCI IN BOTH AEROBIC AND ANAEROBIC BOTTLES CRITICAL VALUE NOTED.  VALUE IS CONSISTENT WITH PREVIOUSLY REPORTED AND CALLED VALUE. Performed at [facility], [address] [place], [place] [address]    Culture STREPTOCOCCUS PNEUMONIAE (A)  Final   Report Status PENDING  Incomplete  Blood Culture (routine x 2)     Status: Abnormal (Preliminary result)    Collection Time: 06/16/20  8:32 AM   Specimen: BLOOD  Result Value Ref Range Status   Specimen Description   Final    BLOOD LEFT FORE ARM Performed at [facility], [address], [place], [place] [address]    Special Requests   Final    BOTTLES DRAWN AEROBIC AND ANAEROBIC Blood Culture results may not be optimal due to an inadequate volume of blood received in culture bottles Performed at [facility], [address] [place], [place] [address]    Culture  Setup Time   Final    Organism ID to follow [place] CRITICAL RESULT CALLED TO, READ BACK BY AND VERIFIED WITH: [place] 06/16/20 AT 2148 HS Performed at [facility], [place] [address], [place], [place] [address]    Culture STREPTOCOCCUS PNEUMONIAE (A)  Final   Report Status PENDING  Incomplete  Blood Culture ID Panel (Reflexed)     Status: Abnormal   Collection Time: 06/16/20  8:32 AM  Result Value Ref Range Status   Enterococcus faecalis NOT DETECTED NOT DETECTED Final   Enterococcus Faecium NOT DETECTED NOT DETECTED Final   Listeria monocytogenes NOT DETECTED NOT DETECTED Final   Staphylococcus species NOT DETECTED NOT DETECTED Final   Staphylococcus aureus (BCID) NOT DETECTED NOT DETECTED Final   Staphylococcus epidermidis NOT  DETECTED NOT DETECTED Final   Staphylococcus lugdunensis NOT DETECTED NOT DETECTED Final   Streptococcus species DETECTED (A) NOT DETECTED Final    Comment: CRITICAL RESULT CALLED TO, READ BACK BY AND VERIFIED WITH: DAVID BESANTI 06/16/20 AT 2148 HS    Streptococcus agalactiae NOT DETECTED NOT DETECTED Final   Streptococcus pneumoniae DETECTED (A) NOT DETECTED Final    Comment: CRITICAL RESULT CALLED TO, READ BACK BY AND VERIFIED WITH: DAVID BESANTI 06/16/20 AT 2148 HS    Streptococcus pyogenes NOT DETECTED NOT DETECTED Final   A.calcoaceticus-baumannii NOT DETECTED NOT DETECTED Final   Bacteroides fragilis NOT DETECTED  NOT DETECTED Final   Enterobacterales NOT DETECTED NOT DETECTED Final   Enterobacter cloacae complex NOT DETECTED NOT DETECTED Final   Escherichia coli NOT DETECTED NOT DETECTED Final   Klebsiella aerogenes NOT DETECTED NOT DETECTED Final   Klebsiella oxytoca NOT DETECTED NOT DETECTED Final   Klebsiella pneumoniae NOT DETECTED NOT DETECTED Final   Proteus species NOT DETECTED NOT DETECTED Final   Salmonella species NOT DETECTED NOT DETECTED Final   Serratia marcescens NOT DETECTED NOT DETECTED Final   Haemophilus influenzae NOT DETECTED NOT DETECTED Final   Neisseria meningitidis NOT DETECTED NOT DETECTED Final   Pseudomonas aeruginosa NOT DETECTED NOT DETECTED Final   Stenotrophomonas maltophilia NOT DETECTED NOT DETECTED Final   Candida albicans NOT DETECTED NOT DETECTED Final   Candida auris NOT DETECTED NOT DETECTED Final   Candida glabrata NOT DETECTED NOT DETECTED Final   Candida krusei NOT DETECTED NOT DETECTED Final   Candida parapsilosis NOT DETECTED NOT DETECTED Final   Candida tropicalis NOT DETECTED NOT DETECTED Final   Cryptococcus neoformans/gattii NOT DETECTED NOT DETECTED Final    Comment: Performed at [facility], [address] [place], [place] [address]    IMAGING RESULTS:  I have personally reviewed the films ? Impression/Recommendation ? ?Strep pneumo bacteremia with multifocal pneumonia On ceftriaxone and azithromycin- Dc latter Smoker  HIV- dont know his immune status- was non compliant with Triumeq- check Cd4 and VL Will not be able to give triumeq as 3tc will have to be dosed for renal disease Will try to reach his HIV provider in [place] ( says he goes to a clinic in [place]) CKD- due to HTN-started dialysis today  Accelerated HTN- was not taking medication ? ___________________________________________________ Discussed the management with patient Note:  This document was prepared using Dragon voice recognition software and may  include unintentional dictation errors.

## 2020-06-18 NOTE — Progress Notes (Signed)
PT Cancellation Note  Patient Details Name: Daniel Combs MRN: [id number] DOB: Mar 01, 1991   Cancelled Treatment:    Reason Eval/Treat Not Completed: Medical issues which prohibited therapy: Pt's recent BP 152/115 falling outside guidelines for participation with PT services.  Nursing contacted and agreed with PT hold this date with pt also off the floor getting permcath placement.  Will attempt to see pt at a future date/time as medically appropriate.     Linus Salmons PT, DPT 06/18/20, 1:23 PM

## 2020-06-18 NOTE — Op Note (Signed)
OPERATIVE NOTE    PRE-OPERATIVE DIAGNOSIS: 1. ESRD   POST-OPERATIVE DIAGNOSIS: same as above  PROCEDURE: 1. Ultrasound guidance for vascular access to the right internal jugular vein 2. Fluoroscopic guidance for placement of catheter 3. Placement of a 19 cm tip to cuff tunneled hemodialysis catheter via the right internal jugular vein  SURGEON: Leotis Pain, MD  ANESTHESIA:  Local with Moderate conscious sedation for approximately 15 minutes using 3 mg of Versed and 75 mcg of Fentanyl  ESTIMATED BLOOD LOSS: 3 cc  FLUORO TIME: less than one minute  CONTRAST: none  FINDING(S): 1.  Patent right internal jugular vein  SPECIMEN(S):  None  INDICATIONS:   Daniel Combs is a 29 y.o.male who presents with renal failure.  The patient needs long term dialysis access for their ESRD, and a Permcath is necessary.  Risks and benefits are discussed and informed consent is obtained.    DESCRIPTION: After obtaining full informed written consent, the patient was brought back to the vascular suited. The patient's right neck and chest were sterilely prepped and draped in a sterile surgical field was created. Moderate conscious sedation was administered during a face to face encounter with the patient throughout the procedure with my supervision of the RN administering medicines and monitoring the patient's vital signs, pulse oximetry, telemetry and mental status throughout from the start of the procedure until the patient was taken to the recovery room.  The right internal jugular vein was visualized with ultrasound and found to be patent. It was then accessed under direct ultrasound guidance and a permanent image was recorded. A wire was placed. After skin nick and dilatation, the peel-away sheath was placed over the wire. I then turned my attention to an area under the clavicle. Approximately 1-2 fingerbreadths below the clavicle a small counterincision was created and tunneled from the subclavicular  incision to the access site. Using fluoroscopic guidance, a 19 centimeter tip to cuff tunneled hemodialysis catheter was selected, and tunneled from the subclavicular incision to the access site. It was then placed through the peel-away sheath and the peel-away sheath was removed. Using fluoroscopic guidance the catheter tips were parked in the right atrium. The appropriate distal connectors were placed. It withdrew blood well and flushed easily with heparinized saline and a concentrated heparin solution was then placed. It was secured to the chest wall with 2 Prolene sutures. The access incision was closed single 4-0 Monocryl. A 4-0 Monocryl pursestring suture was placed around the exit site. Sterile dressings were placed. The patient tolerated the procedure well and was taken to the recovery room in stable condition.  COMPLICATIONS: None  CONDITION: Stable  Leotis Pain, MD 06/18/2020 1:34 PM   This note was created with Dragon Medical transcription system. Any errors in dictation are purely unintentional.

## 2020-06-18 NOTE — Progress Notes (Signed)
Patient requested reassignment of nurse, this RN assumed care.  Reviewed plan of care and reiterated how to call for assistance.

## 2020-06-18 NOTE — Progress Notes (Signed)
PROGRESS NOTE    Daniel Combs  NFA:[id number] DOB: Feb 03, 1991 DOA: 06/16/2020 PCP: System, Pcp Not In   Assessment & Plan:   Principal Problem:   CAP (community acquired pneumonia) Active Problems:   Hypertension   ESRD (end stage renal disease) ([place])   Sepsis ([place])   Hyponatremia   Hyperkalemia   Elevated troponin   Anemia in ESRD (end-stage renal disease) ([place])   Sepsis: secondary to CAP. Meets criteria for sepsis with leukocytosis, tachycardia, tachypnea.  Continue on IV rocephin, azithromycin. Legionella is pending & strep is positive. Continue on bronchodilators and encourage incentive spirometry.   Bacteremia: blood cxs are positive for strept pneumoniae, sens pending. Continue on IV rocephin, azithromycin. ID consulted   ESRD: Cr is significantly elevated & needs HD. Agreed to start HD here so vascular surg w/ put in a permcath 06/18/20. Nephro following and recs apprec   HIV: ? Compliance w/ anti-retroviral medications. ID consulted   Hypertension: continue on home dose of amlodipine. IV hydralazine   Thrombocytopenia: likely secondary to HIV. Resolved  Likely ACD: s/p 1 unit of pRBCs 06/17/20. Try avoid transfusions as pt is a transplant candidate as per nephro. Will continue on IV iron as iron is severely low. Will continue to monitor   Hyponatremia: likely secondary to dehydration. Trending up today.   Hyperkalemia: secondary to ESRD. Resolved   Elevated troponins: likely secondary to demand ischemia & ESRD    DVT prophylaxis: SCDs Code Status: full  Family Communication:  Disposition Plan: depends on PT/OT recs. Pt lives in [place] but in town visiting his mother as per pt    Consultants:   nephro  ID    Procedures:    Antimicrobials: azithromycin, rocephin    Subjective: Pt c/o shortness of breath and vomiting   Objective: Vitals:   06/17/20 2030 06/18/20 0549 06/18/20 0647 06/18/20 1255  BP: (!) 145/108 (!) 171/125 (!) 156/117 (!)  152/115  Pulse: (!) 101 (!) 110  100  Resp:  16  19  Temp: 97.8 F (36.6 C) 98.2 F (36.8 C)  98.6 F (37 C)  TempSrc: Oral Oral  Oral  SpO2: 98% 99%  100%  Weight:      Height:        Intake/Output Summary (Last 24 hours) at 06/18/2020 1332 Last data filed at 06/18/2020 0549 Gross per 24 hour  Intake 510 ml  Output 825 ml  Net -315 ml   Filed Weights   06/16/20 2228  Weight: 60.8 kg    Examination: General exam: Appears uncomfortable  Respiratory system: course breath sounds b/l.  Cardiovascular system: S1 & S2 + No rubs, gallops or clicks.  Gastrointestinal system: Abdomen is nondistended, soft and nontender. Hyperactive bowel sounds Central nervous system: Alert and oriented. Moves all 4 extremities  Psychiatry: Judgement and insight appear normal. Flat mood and affect.      Data Reviewed: I have personally reviewed following labs and imaging studies  CBC: Recent Labs  Lab 06/16/20 0832 06/17/20 0540 06/17/20 2250 06/18/20 0655  WBC 16.2* 10.8*  --  10.3  NEUTROABS 14.7*  --   --   --   HGB 7.4* 6.5* 8.2* 9.1*  HCT 22.3* 19.3* 23.9* 26.0*  MCV 78.5* 78.1*  --  76.5*  PLT 168 149*  --  469   Basic Metabolic Panel: Recent Labs  Lab 06/16/20 0832 06/16/20 1331 06/16/20 2031 06/17/20 0540 06/18/20 0655  NA 128* 131* 132* 130* 132*  K 5.7* 4.8 5.0 5.4* 4.5  CL 99 102 102 101 98  CO2 12* 15* 16* 13* 16*  GLUCOSE 110* 186* 108* 88 102*  BUN 134* 134* 131* 145* 127*  CREATININE 16.68* 15.34* 15.38* 16.44* 16.42*  CALCIUM 8.4* 7.5* 7.7* 7.7* 7.7*   GFR: Estimated Creatinine Clearance: 5.7 mL/min (A) (by C-G formula based on SCr of 16.42 mg/dL (H)). Liver Function Tests: Recent Labs  Lab 06/16/20 0832  AST 15  ALT 30  ALKPHOS 63  BILITOT 0.7  PROT 8.5*  ALBUMIN 3.2*   No results for input(s): LIPASE, AMYLASE in the last 168 hours. No results for input(s): AMMONIA in the last 168 hours. Coagulation Profile: No results for input(s): INR,  PROTIME in the last 168 hours. Cardiac Enzymes: No results for input(s): CKTOTAL, CKMB, CKMBINDEX, TROPONINI in the last 168 hours. BNP (last 3 results) No results for input(s): PROBNP in the last 8760 hours. HbA1C: Recent Labs    06/17/20 0540  HGBA1C 4.9   CBG: No results for input(s): GLUCAP in the last 168 hours. Lipid Profile: Recent Labs    06/16/20 0832 06/17/20 0540  CHOL  --  94  HDL  --  24*  LDLCALC  --  40  TRIG 135 151*  CHOLHDL  --  3.9   Thyroid Function Tests: No results for input(s): TSH, T4TOTAL, FREET4, T3FREE, THYROIDAB in the last 72 hours. Anemia Panel: Recent Labs    06/16/20 0832 06/17/20 0002  VITAMINB12  --  1,376*  FOLATE  --  7.9  FERRITIN 192 200  TIBC  --  119*  IRON  --  11*  RETICCTPCT  --  1.9   Sepsis Labs: Recent Labs  Lab 06/16/20 0832 06/16/20 1204  PROCALCITON >150.00  --   LATICACIDVEN 0.9 0.7    Recent Results (from the past 240 hour(s))  SARS Coronavirus 2 by RT PCR (hospital order, performed in [facility] hospital lab) Nasopharyngeal Nasopharyngeal Swab     Status: None   Collection Time: 06/16/20  8:32 AM   Specimen: Nasopharyngeal Swab  Result Value Ref Range Status   SARS Coronavirus 2 NEGATIVE NEGATIVE Final    Comment: (NOTE) SARS-CoV-2 target nucleic acids are NOT DETECTED.  The SARS-CoV-2 RNA is generally detectable in upper and lower respiratory specimens during the acute phase of infection. The lowest concentration of SARS-CoV-2 viral copies this assay can detect is 250 copies / mL. A negative result does not preclude SARS-CoV-2 infection and should not be used as the sole basis for treatment or other patient management decisions.  A negative result may occur with improper specimen collection / handling, submission of specimen other than nasopharyngeal swab, presence of viral mutation(s) within the areas targeted by this assay, and inadequate number of viral copies (<250 copies / mL). A negative  result must be combined with clinical observations, patient history, and epidemiological information.  Fact Sheet for Patients:   StrictlyIdeas.no  Fact Sheet for Healthcare Providers: BankingDealers.co.za  This test is not yet approved or  cleared by the [place] FDA and has been authorized for detection and/or diagnosis of SARS-CoV-2 by FDA under an Emergency Use Authorization (EUA).  This EUA will remain in effect (meaning this test can be used) for the duration of the COVID-19 declaration under Section 564(b)(1) of the Act, 21 U.S.C. section 360bbb-3(b)(1), unless the authorization is terminated or revoked sooner.  Performed at [facility], [address], [place], [place] [address]   Blood Culture (routine x 2)     Status:  Abnormal (Preliminary result)   Collection Time: 06/16/20  8:32 AM   Specimen: BLOOD  Result Value Ref Range Status   Specimen Description   Final    BLOOD LEFT FORE ARM Performed at [facility], [address] [place], [place] [address]    Special Requests   Final    BOTTLES DRAWN AEROBIC AND ANAEROBIC Blood Culture results may not be optimal due to an inadequate volume of blood received in culture bottles Performed at [facility], [address] [place], [place] [address]    Culture  Setup Time   Final    GRAM POSITIVE COCCI IN BOTH AEROBIC AND ANAEROBIC BOTTLES CRITICAL VALUE NOTED.  VALUE IS CONSISTENT WITH PREVIOUSLY REPORTED AND CALLED VALUE. Performed at [facility], [address] [place], [place] [address]    Culture STREPTOCOCCUS PNEUMONIAE (A)  Final   Report Status PENDING  Incomplete  Blood Culture (routine x 2)     Status: Abnormal (Preliminary result)   Collection Time: 06/16/20  8:32 AM   Specimen: BLOOD  Result Value Ref Range Status   Specimen Description   Final    BLOOD LEFT FORE ARM Performed at [facility], [address], [place], [place] [address]    Special Requests   Final    BOTTLES DRAWN AEROBIC AND ANAEROBIC Blood Culture results may not be optimal due to an inadequate volume of blood received in culture bottles Performed at [facility], [address] [place], [place] [address]    Culture  Setup Time   Final    Organism ID to follow [place] CRITICAL RESULT CALLED TO, READ BACK BY AND VERIFIED WITH: [place] 06/16/20 AT 2148 HS Performed at [facility], [place] [address], [place], [place] [address]    Culture STREPTOCOCCUS PNEUMONIAE (A)  Final   Report Status PENDING  Incomplete  Blood Culture ID Panel (Reflexed)     Status: Abnormal   Collection Time: 06/16/20  8:32 AM  Result Value Ref Range Status   Enterococcus faecalis NOT DETECTED NOT DETECTED Final   Enterococcus Faecium NOT DETECTED NOT DETECTED Final   Listeria monocytogenes NOT DETECTED NOT DETECTED Final   Staphylococcus species NOT DETECTED NOT DETECTED Final   Staphylococcus aureus (BCID) NOT DETECTED NOT DETECTED Final   Staphylococcus epidermidis NOT DETECTED NOT DETECTED Final   Staphylococcus lugdunensis NOT DETECTED NOT DETECTED Final   Streptococcus species DETECTED (A) NOT DETECTED Final    Comment: CRITICAL RESULT CALLED TO, READ BACK BY AND VERIFIED WITH: DAVID BESANTI 06/16/20 AT 2148 HS    Streptococcus agalactiae NOT DETECTED NOT DETECTED Final   Streptococcus pneumoniae DETECTED (A) NOT DETECTED Final    Comment: CRITICAL RESULT CALLED TO, READ BACK BY AND VERIFIED WITH: DAVID BESANTI 06/16/20 AT 2148 HS    Streptococcus pyogenes NOT DETECTED NOT DETECTED Final   A.calcoaceticus-baumannii NOT DETECTED NOT DETECTED Final   Bacteroides fragilis NOT DETECTED NOT DETECTED Final   Enterobacterales NOT DETECTED NOT DETECTED Final   Enterobacter cloacae complex NOT DETECTED NOT DETECTED Final   Escherichia coli NOT DETECTED NOT DETECTED Final    Klebsiella aerogenes NOT DETECTED NOT DETECTED Final   Klebsiella oxytoca NOT DETECTED NOT DETECTED Final   Klebsiella pneumoniae NOT DETECTED NOT DETECTED Final   Proteus species NOT DETECTED NOT DETECTED Final   Salmonella species NOT DETECTED NOT DETECTED Final   Serratia marcescens NOT DETECTED NOT DETECTED Final  Haemophilus influenzae NOT DETECTED NOT DETECTED Final   Neisseria meningitidis NOT DETECTED NOT DETECTED Final   Pseudomonas aeruginosa NOT DETECTED NOT DETECTED Final   Stenotrophomonas maltophilia NOT DETECTED NOT DETECTED Final   Candida albicans NOT DETECTED NOT DETECTED Final   Candida auris NOT DETECTED NOT DETECTED Final   Candida glabrata NOT DETECTED NOT DETECTED Final   Candida krusei NOT DETECTED NOT DETECTED Final   Candida parapsilosis NOT DETECTED NOT DETECTED Final   Candida tropicalis NOT DETECTED NOT DETECTED Final   Cryptococcus neoformans/gattii NOT DETECTED NOT DETECTED Final    Comment: Performed at [facility], [address], [place], [place] [address]         Radiology Studies: US RENAL  Result Date: 06/17/2020 CLINICAL DATA:  End-stage renal disease EXAM: RENAL / URINARY TRACT ULTRASOUND COMPLETE COMPARISON:  None. FINDINGS: Right Kidney: Renal measurements: 8.1 x 4.3 x 3.9 cm = volume: 73 mL. The kidney is atrophic and severely echogenic. There is no hydronephrosis. Left Kidney: Renal measurements: 6.7 x 3.8 x 3.6 cm = volume: 49 mL. The kidney is atrophic and severely echogenic. There is no hydronephrosis. Bladder: Appears normal for degree of bladder distention. Other: None. IMPRESSION: Small, echogenic kidneys bilaterally consistent with a history of end-stage renal disease. There is no hydronephrosis. Electronically Signed   By: Constance Holster M.D.   On: 06/17/2020 16:06        Scheduled Meds: . [MAR Hold] sodium chloride   Intravenous Once  . [MAR Hold] amLODipine  10 mg Oral Daily  . [MAR Hold] aspirin EC  81 mg Oral  Daily  . [MAR Hold] carvedilol  12.5 mg Oral BID WC  . [MAR Hold] Chlorhexidine Gluconate Cloth  6 each Topical Q0600  . fentaNYL      . [MAR Hold] heparin  5,000 Units Subcutaneous Q8H  . [MAR Hold] hydrALAZINE  50 mg Oral Q8H  . midazolam      . [MAR Hold] patiromer  16.8 g Oral Daily   Continuous Infusions: . sodium chloride 10 mL/hr at 06/18/20 1302  . [MAR Hold] azithromycin 500 mg (06/18/20 0809)  . [MAR Hold] cefTRIAXone (ROCEPHIN)  IV 2 g (06/17/20 0850)  . [MAR Hold] iron sucrose 150 mg (06/17/20 1441)     LOS: 2 days    Time spent: 33 mins     Wyvonnia Dusky, MD [facility] Pager [phone]  If 7PM-7AM, please contact night-coverage [email or web address] 06/18/2020, 1:32 PM

## 2020-06-18 NOTE — Progress Notes (Signed)
[facility]  ROUNDING NOTE   Subjective:   Patient is a 29 y.o. male with a PMHx of longstanding hypertension, ESRD not yet on dialysis,has significant azotemia with a BUN of 127 and creatinine of 16.42.   Today,patient found lying in bed ,c/o chest tightness, blood pressure readings are elevated and he is tachycardic. Lengthy conversation carried out with patient emphasizing  the need for urgent dialysis, as his symptoms and lab values stay critical. Patient agreed to go forward with dialysis.  Objective:  Vital signs in last 24 hours:  Temp:  [97.7 F (36.5 C)-98.3 F (36.8 C)] 98.2 F (36.8 C) (09/13 0549) Pulse Rate:  [96-115] 110 (09/13 0549) Resp:  [16-18] 16 (09/13 0549) BP: (145-171)/(108-125) 156/117 (09/13 0647) SpO2:  [98 %-100 %] 99 % (09/13 0549)  Weight change:  Filed Weights   06/16/20 2228  Weight: 60.8 kg    Intake/Output: I/O last 3 completed shifts: In: 930 [P.O.:540; Blood:390] Out: 1075 [Urine:675; Emesis/NG output:400]   Intake/Output this shift:  No intake/output data recorded.  Physical Exam: General:  Anxious, apprehensive   Head:  Oral mucous membranes moist  Eyes:  Sclerae and conjunctivae clear  Neck:  Supple  Lungs:   Crackles + bilaterally  Heart:  Regular, Tachycardic in 120's  Abdomen:   Soft, nontender, non distended  Extremities:  No peripheral edema.  Neurologic:  Awake, alert, oriented x3   Skin:  No acute lesions or rashes  Access: To be established today    Basic Metabolic Panel: Recent Labs  Lab 06/16/20 0832 06/16/20 0832 06/16/20 1331 06/16/20 1331 06/16/20 2031 06/17/20 0540 06/18/20 0655  NA 128*  --  131*  --  132* 130* 132*  K 5.7*  --  4.8  --  5.0 5.4* 4.5  CL 99  --  102  --  102 101 98  CO2 12*  --  15*  --  16* 13* 16*  GLUCOSE 110*  --  186*  --  108* 88 102*  BUN 134*  --  134*  --  131* 145* 127*  CREATININE 16.68*  --  15.34*  --  15.38* 16.44* 16.42*  CALCIUM 8.4*   < > 7.5*   < >  7.7* 7.7* 7.7*   < > = values in this interval not displayed.    Liver Function Tests: Recent Labs  Lab 06/16/20 0832  AST 15  ALT 30  ALKPHOS 63  BILITOT 0.7  PROT 8.5*  ALBUMIN 3.2*   No results for input(s): LIPASE, AMYLASE in the last 168 hours. No results for input(s): AMMONIA in the last 168 hours.  CBC: Recent Labs  Lab 06/16/20 0832 06/17/20 0540 06/17/20 2250 06/18/20 0655  WBC 16.2* 10.8*  --  10.3  NEUTROABS 14.7*  --   --   --   HGB 7.4* 6.5* 8.2* 9.1*  HCT 22.3* 19.3* 23.9* 26.0*  MCV 78.5* 78.1*  --  76.5*  PLT 168 149*  --  180    Cardiac Enzymes: No results for input(s): CKTOTAL, CKMB, CKMBINDEX, TROPONINI in the last 168 hours.  BNP: Invalid input(s): POCBNP  CBG: No results for input(s): GLUCAP in the last 168 hours.  Microbiology: Results for orders placed or performed during the hospital encounter of 06/16/20  SARS Coronavirus 2 by RT PCR (hospital order, performed in [facility] hospital lab) Nasopharyngeal Nasopharyngeal Swab     Status: None   Collection Time: 06/16/20  8:32 AM   Specimen: Nasopharyngeal Swab  Result Value Ref Range Status   SARS Coronavirus 2 NEGATIVE NEGATIVE Final    Comment: (NOTE) SARS-CoV-2 target nucleic acids are NOT DETECTED.  The SARS-CoV-2 RNA is generally detectable in upper and lower respiratory specimens during the acute phase of infection. The lowest concentration of SARS-CoV-2 viral copies this assay can detect is 250 copies / mL. A negative result does not preclude SARS-CoV-2 infection and should not be used as the sole basis for treatment or other patient management decisions.  A negative result may occur with improper specimen collection / handling, submission of specimen other than nasopharyngeal swab, presence of viral mutation(s) within the areas targeted by this assay, and inadequate number of viral copies (<250 copies / mL). A negative result must be combined with clinical observations,  patient history, and epidemiological information.  Fact Sheet for Patients:   StrictlyIdeas.no  Fact Sheet for Healthcare Providers: BankingDealers.co.za  This test is not yet approved or  cleared by the [place] FDA and has been authorized for detection and/or diagnosis of SARS-CoV-2 by FDA under an Emergency Use Authorization (EUA).  This EUA will remain in effect (meaning this test can be used) for the duration of the COVID-19 declaration under Section 564(b)(1) of the Act, 21 U.S.C. section 360bbb-3(b)(1), unless the authorization is terminated or revoked sooner.  Performed at [facility], [address] [place], [place] [address]   Blood Culture (routine x 2)     Status: None (Preliminary result)   Collection Time: 06/16/20  8:32 AM   Specimen: BLOOD  Result Value Ref Range Status   Specimen Description BLOOD LEFT FORE ARM  Final   Special Requests   Final    BOTTLES DRAWN AEROBIC AND ANAEROBIC Blood Culture results may not be optimal due to an inadequate volume of blood received in culture bottles   Culture  Setup Time   Final    GRAM POSITIVE COCCI IN BOTH AEROBIC AND ANAEROBIC BOTTLES CRITICAL VALUE NOTED.  VALUE IS CONSISTENT WITH PREVIOUSLY REPORTED AND CALLED VALUE. Performed at [facility], [address] [place], [place] [address]    Culture [facility] POSITIVE COCCI  Final   Report Status PENDING  Incomplete  Blood Culture (routine x 2)     Status: Abnormal (Preliminary result)   Collection Time: 06/16/20  8:32 AM   Specimen: BLOOD  Result Value Ref Range Status   Specimen Description   Final    BLOOD LEFT FORE ARM Performed at [facility], [address] [place], [place] [address]    Special Requests   Final    BOTTLES DRAWN AEROBIC AND ANAEROBIC Blood Culture results may not be optimal due to an inadequate volume of blood received in culture bottles Performed at [facility], [address] [place], [place] [address]    Culture  Setup Time   Final    Organism ID to follow [place] AND ANAEROBIC BOTTLES CRITICAL RESULT CALLED TO, READ BACK BY AND VERIFIED WITH: DAVID Herscher 06/16/20 AT 2148 HS Performed at [facility], [place] [address], [place],  [address]    Culture STREPTOCOCCUS PNEUMONIAE (A)  Final   Report Status PENDING  Incomplete  Blood Culture ID Panel (Reflexed)     Status: Abnormal   Collection Time: 06/16/20  8:32 AM  Result Value Ref Range Status   Enterococcus faecalis NOT DETECTED NOT DETECTED Final   Enterococcus Faecium NOT DETECTED NOT DETECTED Final   Listeria monocytogenes  NOT DETECTED NOT DETECTED Final   Staphylococcus species NOT DETECTED NOT DETECTED Final   Staphylococcus aureus (BCID) NOT DETECTED NOT DETECTED Final   Staphylococcus epidermidis NOT DETECTED NOT DETECTED Final   Staphylococcus lugdunensis NOT DETECTED NOT DETECTED Final   Streptococcus species DETECTED (A) NOT DETECTED Final    Comment: CRITICAL RESULT CALLED TO, READ BACK BY AND VERIFIED WITH: DAVID BESANTI 06/16/20 AT 2148 HS    Streptococcus agalactiae NOT DETECTED NOT DETECTED Final   Streptococcus pneumoniae DETECTED (A) NOT DETECTED Final    Comment: CRITICAL RESULT CALLED TO, READ BACK BY AND VERIFIED WITH: DAVID BESANTI 06/16/20 AT 2148 HS    Streptococcus pyogenes NOT DETECTED NOT DETECTED Final   A.calcoaceticus-baumannii NOT DETECTED NOT DETECTED Final   Bacteroides fragilis NOT DETECTED NOT DETECTED Final   Enterobacterales NOT DETECTED NOT DETECTED Final   Enterobacter cloacae complex NOT DETECTED NOT DETECTED Final   Escherichia coli NOT DETECTED NOT DETECTED Final   Klebsiella aerogenes NOT DETECTED NOT DETECTED Final   Klebsiella oxytoca NOT DETECTED NOT DETECTED Final   Klebsiella pneumoniae NOT DETECTED NOT DETECTED Final   Proteus species NOT DETECTED NOT DETECTED Final   Salmonella  species NOT DETECTED NOT DETECTED Final   Serratia marcescens NOT DETECTED NOT DETECTED Final   Haemophilus influenzae NOT DETECTED NOT DETECTED Final   Neisseria meningitidis NOT DETECTED NOT DETECTED Final   Pseudomonas aeruginosa NOT DETECTED NOT DETECTED Final   Stenotrophomonas maltophilia NOT DETECTED NOT DETECTED Final   Candida albicans NOT DETECTED NOT DETECTED Final   Candida auris NOT DETECTED NOT DETECTED Final   Candida glabrata NOT DETECTED NOT DETECTED Final   Candida krusei NOT DETECTED NOT DETECTED Final   Candida parapsilosis NOT DETECTED NOT DETECTED Final   Candida tropicalis NOT DETECTED NOT DETECTED Final   Cryptococcus neoformans/gattii NOT DETECTED NOT DETECTED Final    Comment: Performed at [facility], [address] [place], [place] [address]    Coagulation Studies: No results for input(s): LABPROT, INR in the last 72 hours.  Urinalysis: No results for input(s): COLORURINE, LABSPEC, PHURINE, GLUCOSEU, HGBUR, BILIRUBINUR, KETONESUR, PROTEINUR, UROBILINOGEN, NITRITE, LEUKOCYTESUR in the last 72 hours.  Invalid input(s): APPERANCEUR    Imaging: US RENAL  Result Date: 06/17/2020 CLINICAL DATA:  End-stage renal disease EXAM: RENAL / URINARY TRACT ULTRASOUND COMPLETE COMPARISON:  None. FINDINGS: Right Kidney: Renal measurements: 8.1 x 4.3 x 3.9 cm = volume: 73 mL. The kidney is atrophic and severely echogenic. There is no hydronephrosis. Left Kidney: Renal measurements: 6.7 x 3.8 x 3.6 cm = volume: 49 mL. The kidney is atrophic and severely echogenic. There is no hydronephrosis. Bladder: Appears normal for degree of bladder distention. Other: None. IMPRESSION: Small, echogenic kidneys bilaterally consistent with a history of end-stage renal disease. There is no hydronephrosis. Electronically Signed   By: Constance Holster M.D.   On: 06/17/2020 16:06     Medications:   . azithromycin 500 mg (06/18/20 0809)  . cefTRIAXone (ROCEPHIN)  IV 2 g (06/17/20  0850)  . iron sucrose 150 mg (06/17/20 1441)   . sodium chloride   Intravenous Once  . amLODipine  10 mg Oral Daily  . aspirin EC  81 mg Oral Daily  . carvedilol  12.5 mg Oral BID WC  . Chlorhexidine Gluconate Cloth  6 each Topical Q0600  . heparin  5,000 Units Subcutaneous Q8H  . hydrALAZINE  50 mg Oral Q8H  . patiromer  16.8 g Oral Daily   acetaminophen, chlorpheniramine-HYDROcodone, hydrALAZINE,  labetalol, morphine injection, ondansetron (ZOFRAN) IV, oxyCODONE-acetaminophen, promethazine  Assessment/ Plan:  29 y.o. male  with a PMHx of longstanding hypertension, ESRD not yet on dialysis, anemia of chronic kidney disease, secondary hyperparathyroidism, who was admitted to [facility] on 06/16/2020 for evaluation of shortness of breath and chest pain.   1.  ESRD not yet on dialysis.   Patient was last seen by nephrologist in [place] approximately 1 year ago.  He was told at that time that he needed renal placement therapy but does not yet have dialysis access given delays in care secondary to the pandemic.  I advised the patient that he clearly needs renal placement therapy.  However patient states that he is only visiting [place] and would like to wait till he goes back to [place].  Counseled the patient against this as it may take significant time to get in with a nephrologist and then to have dialysis access established. After reinforcing the requirement for urgent dialysis, patient agreed to go forward with dialysis today. Vascular consult placed. Dialysis orders initiated.  2.  Hypertension.   BP readings staying elevated Continue amlodipine, hydralazine, labetalol. Added Carvedilol  3.  Anemia of chronic kidney disease.   Hemoglobin 9.1 today.   He is a transplant candidate and would recommend trying to avoid blood products if possible.  Has been started on IV iron on 06/17/20 Will continue monitoring  4.  Hyperkalemia.  K+ 4.5 today, will continue monitoring   LOS:  2 Gillian Meeuwsen 9/13/20219:53 AM

## 2020-06-18 NOTE — Consult Note (Signed)
[facility] Vascular Consult Note  MRN : [id number]  Daniel Combs is a 29 y.o. ([birth year]) male who presents with chief complaint of  Chief Complaint  Patient presents with  . Chest Pain  . Cough  . Back Pain   History of Present Illness:  Daniel Combs is a 29 y.o. male with medical history significant of hypertension, tobacco abuse, possible ESRD (his creatinine 11.1 on 03/20/2020), not on dialysis, who presents with shortness breath, cough, chest pain.  Patient from [place] and visiting his mother.  Has been here for approximately 3 weeks.  He was following with a nephrologist in [place] and was told approximately a year ago that he needed dialysis.  However patient states that due to the pandemic he was unable to keep some appointments.  He does not have an underlying dialysis access.  He has significant azotemia with a BUN of 145 and creatinine of 16.4.   Vascular Surgery was consulted by Dr. Holley Raring for placement of a Permcath.   Current Facility-Administered Medications  Medication Dose Route Frequency Provider Last Rate Last Admin  . 0.9 %  sodium chloride infusion (Manually program via Guardrails IV Fluids)   Intravenous Once Sharion Settler, NP      . acetaminophen (TYLENOL) tablet 650 mg  650 mg Oral Q6H PRN Ivor Costa, MD   650 mg at 06/16/20 2056  . amLODipine (NORVASC) tablet 10 mg  10 mg Oral Daily Wyvonnia Dusky, MD   10 mg at 06/18/20 0906  . aspirin EC tablet 81 mg  81 mg Oral Daily Ivor Costa, MD   81 mg at 06/18/20 0905  . azithromycin (ZITHROMAX) 500 mg in sodium chloride 0.9 % 250 mL IVPB  500 mg Intravenous Q24H Ivor Costa, MD 250 mL/hr at 06/18/20 0809 500 mg at 06/18/20 0809  . carvedilol (COREG) tablet 12.5 mg  12.5 mg Oral BID WC Lateef, Munsoor, MD   12.5 mg at 06/18/20 0906  . cefTRIAXone (ROCEPHIN) 2 g in sodium chloride 0.9 % 100 mL IVPB  2 g Intravenous Q24H Ivor Costa, MD 200 mL/hr at 06/17/20 0850 2 g at 06/17/20  0850  . Chlorhexidine Gluconate Cloth 2 % PADS 6 each  6 each Topical Q0600 Lateef, Munsoor, MD      . chlorpheniramine-HYDROcodone (TUSSIONEX) 10-8 MG/5ML suspension 5 mL  5 mL Oral QHS PRN Wyvonnia Dusky, MD   5 mL at 06/17/20 2259  . heparin injection 5,000 Units  5,000 Units Subcutaneous Q8H Ivor Costa, MD   5,000 Units at 06/18/20 0545  . hydrALAZINE (APRESOLINE) injection 10 mg  10 mg Intravenous Q4H PRN Sharion Settler, NP   10 mg at 06/18/20 2951  . hydrALAZINE (APRESOLINE) tablet 50 mg  50 mg Oral Q8H Lateef, Munsoor, MD   50 mg at 06/18/20 0905  . iron sucrose (VENOFER) 150 mg in sodium chloride 0.9 % 150 mL IVPB  150 mg Intravenous Q24H Wyvonnia Dusky, MD 157.5 mL/hr at 06/17/20 1441 150 mg at 06/17/20 1441  . labetalol (NORMODYNE) injection 10 mg  10 mg Intravenous Q4H PRN Sharion Settler, NP   10 mg at 06/18/20 0859  . morphine 2 MG/ML injection 1 mg  1 mg Intravenous Q3H PRN Wyvonnia Dusky, MD   1 mg at 06/17/20 1301  . ondansetron (ZOFRAN) injection 4 mg  4 mg Intravenous Q8H PRN Ivor Costa, MD   4 mg at 06/18/20 0803  . oxyCODONE-acetaminophen (PERCOCET) 7.5-325 MG per tablet 1  tablet  1 tablet Oral Q6H PRN Wyvonnia Dusky, MD      . patiromer Daryll Drown) packet 16.8 g  16.8 g Oral Daily Lateef, Munsoor, MD   16.8 g at 06/17/20 1759  . promethazine (PHENERGAN) injection 12.5 mg  12.5 mg Intravenous Q6H PRN Wyvonnia Dusky, MD       Past Medical History:  Diagnosis Date  . Hypertension    History reviewed. No pertinent surgical history.  Social History Social History   Tobacco Use  . Smoking status: Current Every Day Smoker  . Smokeless tobacco: Never Used  Substance Use Topics  . Alcohol use: Not Currently  . Drug use: Never   Family History Family History  Problem Relation Age of Onset  . Diabetes Mellitus II Mother   . Hypertension Mother   Positive for renal disease (sister). Denies PAD or venous disease.   No Known Allergies  REVIEW  OF SYSTEMS (Negative unless checked)  Constitutional: [] Weight loss  [] Fever  [] Chills Cardiac: [x] Chest pain   [x] Chest pressure   [] Palpitations   [x] Shortness of breath when laying flat   [x] Shortness of breath at rest   [x] Shortness of breath with exertion. Vascular:  [] Pain in legs with walking   [] Pain in legs at rest   [] Pain in legs when laying flat   [] Claudication   [] Pain in feet when walking  [] Pain in feet at rest  [] Pain in feet when laying flat   [] History of DVT   [] Phlebitis   [] Swelling in legs   [] Varicose veins   [] Non-healing ulcers Pulmonary:   [] Uses home oxygen   [] Productive cough   [] Hemoptysis   [] Wheeze  [] COPD   [] Asthma Neurologic:  [] Dizziness  [] Blackouts   [] Seizures   [] History of stroke   [] History of TIA  [] Aphasia   [] Temporary blindness   [] Dysphagia   [] Weakness or numbness in arms   [] Weakness or numbness in legs Musculoskeletal:  [] Arthritis   [] Joint swelling   [] Joint pain   [] Low back pain Hematologic:  [] Easy bruising  [] Easy bleeding   [] Hypercoagulable state   [] Anemic  [] Hepatitis Gastrointestinal:  [] Blood in stool   [] Vomiting blood  [] Gastroesophageal reflux/heartburn   [] Difficulty swallowing. Genitourinary:  [x] Chronic kidney disease   [] Difficult urination  [] Frequent urination  [] Burning with urination   [] Blood in urine Skin:  [] Rashes   [] Ulcers   [] Wounds Psychological:  [] History of anxiety   []  History of major depression.  Physical Examination  Vitals:   06/17/20 1816 06/17/20 2030 06/18/20 0549 06/18/20 0647  BP: (!) 154/124 (!) 145/108 (!) 171/125 (!) 156/117  Pulse: 96 (!) 101 (!) 110   Resp: 18  16   Temp: 97.7 F (36.5 C) 97.8 F (36.6 C) 98.2 F (36.8 C)   TempSrc: Oral Oral Oral   SpO2: 100% 98% 99%   Weight:      Height:       Body mass index is 21.63 kg/m. Gen:  WD/WN, NAD Head: [place]/AT, No temporalis wasting. Prominent temp pulse not noted. Ear/Nose/Throat: Hearing grossly intact, nares w/o erythema or drainage,  oropharynx w/o Erythema/Exudate Eyes: Sclera non-icteric, conjunctiva clear Neck: Trachea midline.  No JVD.  Pulmonary:  Good air movement, respirations not labored, equal bilaterally.  Cardiac: RRR, normal S1, S2. Vascular:  Vessel Right Left  Radial Palpable Palpable  Ulnar Palpable Palpable  Brachial Palpable Palpable  Carotid Palpable, without bruit Palpable, without bruit  Aorta Not palpable N/A  Femoral Palpable Palpable  Popliteal Palpable  Palpable  PT Palpable Palpable  DP Palpable Palpable   Gastrointestinal: soft, non-tender/non-distended. No guarding/reflex.  Musculoskeletal: M/S 5/5 throughout.  Extremities without ischemic changes.  No deformity or atrophy. No edema. Neurologic: Sensation grossly intact in extremities.  Symmetrical.  Speech is fluent. Motor exam as listed above. Psychiatric: Judgment intact, Mood & affect appropriate for pt's clinical situation. Dermatologic: No rashes or ulcers noted.  No cellulitis or open wounds. Lymph : No Cervical, Axillary, or Inguinal lymphadenopathy.  CBC Lab Results  Component Value Date   WBC 10.3 06/18/2020   HGB 9.1 (L) 06/18/2020   HCT 26.0 (L) 06/18/2020   MCV 76.5 (L) 06/18/2020   PLT 180 06/18/2020   BMET    Component Value Date/Time   NA 132 (L) 06/18/2020 0655   K 4.5 06/18/2020 0655   CL 98 06/18/2020 0655   CO2 16 (L) 06/18/2020 0655   GLUCOSE 102 (H) 06/18/2020 0655   BUN 127 (H) 06/18/2020 0655   CREATININE 16.42 (H) 06/18/2020 0655   CALCIUM 7.7 (L) 06/18/2020 0655   GFRNONAA 3 (L) 06/18/2020 0655   GFRAA 4 (L) 06/18/2020 0655   Estimated Creatinine Clearance: 5.7 mL/min (A) (by C-G formula based on SCr of 16.42 mg/dL (H)).  COAG No results found for: INR, PROTIME  Radiology US RENAL  Result Date: 06/17/2020 CLINICAL DATA:  End-stage renal disease EXAM: RENAL / URINARY TRACT ULTRASOUND COMPLETE COMPARISON:  None. FINDINGS: Right Kidney: Renal measurements: 8.1 x 4.3 x 3.9 cm = volume: 73 mL.  The kidney is atrophic and severely echogenic. There is no hydronephrosis. Left Kidney: Renal measurements: 6.7 x 3.8 x 3.6 cm = volume: 49 mL. The kidney is atrophic and severely echogenic. There is no hydronephrosis. Bladder: Appears normal for degree of bladder distention. Other: None. IMPRESSION: Small, echogenic kidneys bilaterally consistent with a history of end-stage renal disease. There is no hydronephrosis. Electronically Signed   By: Constance Holster M.D.   On: 06/17/2020 16:06   DG Chest Port 1 View  Result Date: 06/16/2020 CLINICAL DATA:  Shortness of breath with cough and chest pain EXAM: PORTABLE CHEST 1 VIEW COMPARISON:  None. FINDINGS: There is somewhat ill-defined airspace opacity in portions of the left mid lower lung regions. The right lung is clear. Heart is upper normal in size with pulmonary vascularity normal. No adenopathy. No bone lesions. IMPRESSION: Ill-defined airspace opacity left mid and lower lung regions consistent with multifocal pneumonia. Question atypical organism pneumonia given this appearance. Lungs elsewhere clear. Heart upper normal in size. No evident adenopathy. Electronically Signed   By: Lowella Grip III M.D.   On: 06/16/2020 09:21   Assessment/Plan Daniel Combs is a 29 y.o. male with medical history significant of hypertension, tobacco abuse, possible ESRD (his creatinine 11.1 on 03/20/2020), not on dialysis, who presents with shortness breath, cough, chest pain found to have worsening renal disease  1. ESRD:  Nephrology would like to initiate dialysis at this time however the patient does not have a dialysis access.  Asked to placed PermCath by nephrology to allow the patient to dialyze.  Procedure, risks and benefits were explained to the patient.  All questions were answered. The patient wished to proceed.  2.  Hypertension: On appropriate medications. Encouraged good control as its slows the progression of atherosclerotic disease  3.  Anemia of chronic disease: Followed by nephrology Asymptomatic at this time.  Discussed with Dr. Mayme Genta, PA-C  06/18/2020 10:15 AM  This note was created with [employer]  medical transcription system.  Any error is purely unintentional

## 2020-06-18 NOTE — Progress Notes (Signed)
Patient complaining that, "..I can't breathe..my chest is tight.. I want oxygen..."  O2 sat is 100% RA; encouraged patient to stop talking and take slow deep breaths; started talking loudly; "...you going to wait until I flat line..." Told patient that I was doing everything I could to help him, that I couldn't just put oxygen on him just because he wanted it;  then he wouldn't let me put the O2 tubing on him; getting madder and talking more loudly that this nurse wouldn't give him the oxygen. Told him that we were watching his heart and that I would try to help him. He said that I "wasn't helping" him, that he wanted a new nurse. Marine scientist called to address patient concerns. Barbaraann Faster, RN 11:08 PM 06/18/2020

## 2020-06-18 NOTE — Evaluation (Signed)
Occupational Therapy Evaluation Patient Details Name: Daniel Combs MRN: [id number] DOB: [birth year] Today's Date: 06/18/2020    History of Present Illness Pt is a 29 y.o. male who presented to ED with shortness breath, cough, R-sided back pain, and chest pain X 1 week. PMHx includes HTN, tobacco abuse, possible ESRD, not on dialysis.   Clinical Impression   Pt came to [place] to visit his mother and celebrate his 19th birthday. While at his mother's home, be began to experience SOB, back and chest paint, cough, and dizziness. Pt lives in [place], in a second floor apartment, in a non-elevator building. Prior to the current illness, pt has been IND in all ADL and IADL and working full time. Pt reports that his sister died at age 33 of ESRD while on dialysis, and that this is "weighing" on him. Upon evaluation, pt presents with extreme dizziness, fatigue, and SOB. No neurological deficits noted, and pt is A/O x 4. Pt has had frequent episodes this day of vomiting small amounts of clear liquid, 2/2 nausea and dizziness. At present, pt is able to come to EOB sitting but leans laterally in bed, supporting himself with his R arm/elbow, stating he is too dizzy to sit up straight. Pt able to don socks from EOB, but requires ~ 5 minutes rest break between donning first and second sock, secondary to fatigue and SOB. Attempted standing with RW, but pt requested to return immediately to EOB, secondary to dizziness. Pt is scheduled today for PermCath placement and will begin hemodialysis. While in hospital, pt would benefit from skilled OT services to address noted impairments and functional limitations in order to maximize safety and return to PLOF.  Upon hospital discharge, pt plans to spend time at his mother's home before returning to his home in [place] during this time is recommended, to assist this young patient in re-gaining functional independence.     Follow Up Recommendations  Home health OT     Equipment Recommendations   (has needed equipment here in , at his mother's home. No equipment at his apt in [place])    Recommendations for Other Services       Precautions / Restrictions Precautions Precautions: Fall Restrictions Weight Bearing Restrictions: No      Mobility Bed Mobility Overal bed mobility: Independent                Transfers Overall transfer level: Needs assistance Equipment used: Rolling walker (2 wheeled)             General transfer comment: experiences SOB with any activity; dizzyness with movement    Balance Overall balance assessment: Needs assistance   Sitting balance-Leahy Scale: Poor Sitting balance - Comments: Leans EOB, using R elbow and lower arm to support body, states "too dizzy" to site up without arm support Postural control: Right lateral lean   Standing balance-Leahy Scale: Poor                             ADL either performed or assessed with clinical judgement   ADL Overall ADL's : Needs assistance/impaired Eating/Feeding: Set up   Grooming: Minimal assistance   Upper Body Bathing: Minimal assistance   Lower Body Bathing: Minimal assistance   Upper Body Dressing : Minimal assistance   Lower Body Dressing: Moderate assistance Lower Body Dressing Details (indicate cue type and reason): pt able to put on socks while sitting EOB, but required multiple cues  to continue. Needed to rest ~ 5 minutes after putting on first sock before moving to second sock, 2/2 SOB and dizzyness Toilet Transfer: Moderate assistance   Toileting- Clothing Manipulation and Hygiene: Moderate assistance       Functional mobility during ADLs: Rolling walker;Minimal assistance General ADL Comments: limited by fatigue, dizzyness, SOB     Vision Baseline Vision/History: No visual deficits Patient Visual Report: No change from baseline       Perception     Praxis      Pertinent Vitals/Pain       Hand Dominance      Extremity/Trunk Assessment Upper Extremity Assessment Upper Extremity Assessment: Overall WFL for tasks assessed   Lower Extremity Assessment Lower Extremity Assessment: Defer to PT evaluation       Communication Communication Communication: No difficulties   Cognition Arousal/Alertness: Awake/alert Behavior During Therapy: WFL for tasks assessed/performed Overall Cognitive Status: Within Functional Limits for tasks assessed                                     General Comments  Pt has been frequently vomitting small amounts of clear liquid, stating this is from nausea 2/2 to dizzyness    Exercises Other Exercises Other Exercises: Provided education re: energy conservation; grooming activities from EOB; Provided education/demonstration of exercises pt can do from bed, given that at present is too dizzy to get OOB or sit EOB for more than a few minutes   Shoulder Instructions      Home Living Family/patient expects to be discharged to:: Private residence Living Arrangements: Alone   Type of Home: Apartment Home Access: Stairs to enter [employer] of Steps: apt is on second floor of non-elevator building   Home Layout: One level     Bathroom Shower/Tub: Teacher, early years/pre: Standard Bathroom Accessibility: No   Home Equipment: None   Additional Comments: Pt lives in an apt in [place]. He is currently in [place] visiting his mother. Pt himself has no adaptive equipment/DME  Equipment is available at his mother's house, however: tub with seat and grab bar, RW, rollator.      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  OT Frequency: Min 2X/week   Barriers to D/C:    Pt lives in [place], on second floor of apt w/o elevator.       Co-evaluation              AM-PAC OT "6 Clicks" Daily Activity     Outcome Measure Help from another person eating meals?: None Help from another person taking care of personal grooming?: A Little Help from another  person toileting, which includes using toliet, bedpan, or urinal?: A Lot Help from another person bathing (including washing, rinsing, drying)?: A Little Help from another person to put on and taking off regular upper body clothing?: A Little Help from another person to put on and taking off regular lower body clothing?: A Lot 6 Click Score: 17   End of Session Equipment Utilized During Treatment: Rolling walker;Gait belt  Activity Tolerance: Patient limited by fatigue (Pt limited by SOB, dizzyness) Patient left: in bed;with call bell/phone within reach;with bed alarm set  OT Visit Diagnosis: Unsteadiness on feet (R26.81);Dizziness and giddiness (R42)                Time: 6812-7517 OT Time Calculation (min): 29 min Charges:  OT General Charges $OT Visit: 1 Visit OT Evaluation $OT Eval Low Complexity: 1 Low OT Treatments $Therapeutic Activity: 8-22 mins  [name], PhD, MS, OTR/L ascom [phone] 06/18/20, 11:51 AM

## 2020-06-19 LAB — CBC
HCT: 22.4 % — ABNORMAL LOW (ref 39.0–52.0)
Hemoglobin: 7.5 g/dL — ABNORMAL LOW (ref 13.0–17.0)
MCH: 26.7 pg (ref 26.0–34.0)
MCHC: 33.5 g/dL (ref 30.0–36.0)
MCV: 79.7 fL — ABNORMAL LOW (ref 80.0–100.0)
Platelets: 158 10*3/uL (ref 150–400)
RBC: 2.81 MIL/uL — ABNORMAL LOW (ref 4.22–5.81)
RDW: 17.2 % — ABNORMAL HIGH (ref 11.5–15.5)
WBC: 8.3 10*3/uL (ref 4.0–10.5)
nRBC: 0.6 % — ABNORMAL HIGH (ref 0.0–0.2)

## 2020-06-19 LAB — HIV-1 RNA QUANT-NO REFLEX-BLD
HIV 1 RNA Quant: 14900 copies/mL
LOG10 HIV-1 RNA: 4.173 log10copy/mL

## 2020-06-19 LAB — CD4/CD8 (T-HELPER/T-SUPPRESSOR CELL)
CD4 absolute: 74 /uL — ABNORMAL LOW (ref 400–1790)
CD4%: 12 % — ABNORMAL LOW (ref 33–65)
CD8 T Cell Abs: 307 /uL (ref 190–1000)
CD8tox: 49 % — ABNORMAL HIGH (ref 12–40)
Ratio: 0.24 — ABNORMAL LOW (ref 1.0–3.0)
Total lymphocyte count: 630 /uL — ABNORMAL LOW (ref 1000–4000)

## 2020-06-19 LAB — BASIC METABOLIC PANEL
Anion gap: 17 — ABNORMAL HIGH (ref 5–15)
BUN: 116 mg/dL — ABNORMAL HIGH (ref 6–20)
CO2: 16 mmol/L — ABNORMAL LOW (ref 22–32)
Calcium: 7.2 mg/dL — ABNORMAL LOW (ref 8.9–10.3)
Chloride: 98 mmol/L (ref 98–111)
Creatinine, Ser: 14.31 mg/dL — ABNORMAL HIGH (ref 0.61–1.24)
GFR calc Af Amer: 5 mL/min — ABNORMAL LOW (ref >60)
GFR calc non Af Amer: 4 mL/min — ABNORMAL LOW (ref >60)
Glucose, Bld: 95 mg/dL (ref 70–99)
Potassium: 4.3 mmol/L (ref 3.5–5.1)
Sodium: 131 mmol/L — ABNORMAL LOW (ref 135–145)

## 2020-06-19 LAB — CULTURE, BLOOD (ROUTINE X 2)

## 2020-06-19 LAB — PROTEIN ELECTROPHORESIS, SERUM
A/G Ratio: 0.6 — ABNORMAL LOW (ref 0.7–1.7)
Albumin ELP: 2.5 g/dL — ABNORMAL LOW (ref 2.9–4.4)
Alpha-1-Globulin: 0.5 g/dL — ABNORMAL HIGH (ref 0.0–0.4)
Alpha-2-Globulin: 0.7 g/dL (ref 0.4–1.0)
Beta Globulin: 0.7 g/dL (ref 0.7–1.3)
Gamma Globulin: 2.1 g/dL — ABNORMAL HIGH (ref 0.4–1.8)
Globulin, Total: 4 g/dL — ABNORMAL HIGH (ref 2.2–3.9)
Total Protein ELP: 6.5 g/dL (ref 6.0–8.5)

## 2020-06-19 LAB — MPO/PR-3 (ANCA) ANTIBODIES
ANCA Proteinase 3: <3.5 U/mL (ref 0.0–3.5)
Myeloperoxidase Abs: <9 U/mL (ref 0.0–9.0)

## 2020-06-19 LAB — C4 COMPLEMENT: Complement C4, Body Fluid: 23 mg/dL (ref 12–38)

## 2020-06-19 LAB — ANA W/REFLEX IF POSITIVE: Anti Nuclear Antibody (ANA): NEGATIVE

## 2020-06-19 LAB — PARATHYROID HORMONE, INTACT (NO CA): PTH: 654 pg/mL — ABNORMAL HIGH (ref 15–65)

## 2020-06-19 LAB — C3 COMPLEMENT: C3 Complement: 107 mg/dL (ref 82–167)

## 2020-06-19 MED ORDER — ASPIRIN-ACETAMINOPHEN-CAFFEINE 250-250-65 MG PO TABS
1.0000 | ORAL_TABLET | Freq: Once | ORAL | Status: AC | PRN
  Administered 2020-06-19: 1 via ORAL
  Filled 2020-06-19 (×2): qty 1

## 2020-06-19 MED ORDER — SODIUM CHLORIDE 0.9 % IV SOLN
150.0000 mg | INTRAVENOUS | Status: AC
  Administered 2020-06-19 – 2020-06-20 (×2): 150 mg via INTRAVENOUS
  Filled 2020-06-19 (×2): qty 7.5

## 2020-06-19 MED ORDER — SULFAMETHOXAZOLE-TRIMETHOPRIM 400-80 MG PO TABS
1.0000 | ORAL_TABLET | Freq: Every day | ORAL | Status: DC
  Administered 2020-06-19 – 2020-07-06 (×17): 1 via ORAL
  Filled 2020-06-19 (×18): qty 1

## 2020-06-19 MED ORDER — LABETALOL HCL 5 MG/ML IV SOLN
10.0000 mg | INTRAVENOUS | Status: DC | PRN
  Administered 2020-06-19 – 2020-06-22 (×2): 10 mg via INTRAVENOUS
  Filled 2020-06-19 (×2): qty 4

## 2020-06-19 NOTE — Progress Notes (Signed)
OT Cancellation Note  Patient Details Name: Daniel Combs MRN: [id number] DOB: 27-Jun-1991   Cancelled Treatment:    Reason Eval/Treat Not Completed: Pain limiting ability to participate. Pt states he "has a migraine" and "does not want to move." Says that he will try his best to participate in therapy tomorrow.   [name], PhD, [place], OTR/L ascom [phone] 06/19/20, 3:28 PM

## 2020-06-19 NOTE — Progress Notes (Signed)
Mobility Specialist - Progress Note   06/19/20 1445  Mobility  Activity Contraindicated/medical hold  Mobility performed by Mobility specialist    Mobility on hold d/t elevated BP. Will attempt session at another date/time when pt is medically appropriate.   Kathee Delton Mobility Specialist 06/19/20, 2:46 PM

## 2020-06-19 NOTE — Progress Notes (Signed)
PROGRESS NOTE    Daniel Combs  XNA:[id number] DOB: 07-08-1991 DOA: 06/16/2020 PCP: System, Pcp Not In   Assessment & Plan:   Principal Problem:   CAP (community acquired pneumonia) Active Problems:   Hypertension   ESRD (end stage renal disease) ([place])   Sepsis ([place])   Hyponatremia   Hyperkalemia   Elevated troponin   Anemia in ESRD (end-stage renal disease) ([place])   Sepsis: secondary to CAP. Meets criteria for sepsis with leukocytosis, tachycardia, tachypnea.  Continue on IV rocephin. Legionella is neg & strep is positive. Continue on bronchodilators and encourage incentive spirometry.   Bacteremia: blood cxs are positive for strept pneumoniae, sens pending. Continue on IV rocephin. ID following and recs apprec   ESRD: Cr is significantly elevated & needs HD. Started on HD. S/p permcath 06/18/20 as per vascular surg. Nephro following and recs apprec   HIV: ? Compliance w/ anti-retroviral medications. ID following and recs apprec   Hypertension: continue on home dose of amlodipine. IV hydralazine   Thrombocytopenia: likely secondary to HIV. Resolved  Likely ACD: s/p 1 unit of pRBCs 06/17/20. Try avoid transfusions as pt is a transplant candidate as per nephro. Will continue on IV iron as iron is severely low. Will continue to monitor   Hyponatremia: will be managed w/ HD   Hyperkalemia: secondary to ESRD. Resolved   Elevated troponins: likely secondary to demand ischemia & ESRD    DVT prophylaxis: SCDs Code Status: full  Family Communication:  Disposition Plan: depends on PT/OT recs. Pt lives in [place] but in town visiting his mother as per pt    Consultants:   nephro  ID    Procedures:    Antimicrobials: rocephin     Subjective: Pt c/o intermittent dizziness   Objective: Vitals:   06/19/20 1100 06/19/20 1115 06/19/20 1130 06/19/20 1354  BP: (!) 165/118 (!) 167/111 (!) 170/121 (!) 157/117  Pulse: (!) 105 (!) 115 99 (!) 105  Resp: 18 16 15      Temp:      TempSrc:      SpO2: 100% 99% 100% 99%  Weight:      Height:        Intake/Output Summary (Last 24 hours) at 06/19/2020 1356 Last data filed at 06/19/2020 0505 Gross per 24 hour  Intake 1314.82 ml  Output 1125 ml  Net 189.82 ml   Filed Weights   06/16/20 2228  Weight: 60.8 kg    Examination: General exam: Appears uncomfortable  Respiratory system: diminished breath sounds b/l  Cardiovascular system: S1 & S2 + No rubs, gallops or clicks.  Gastrointestinal system: Abdomen is nondistended, soft and nontender. Hypoactive bowel sounds  Central nervous system: Alert and oriented. Moves all 4 extremities  Psychiatry: Judgement and insight appear normal. Flat mood and affect     Data Reviewed: I have personally reviewed following labs and imaging studies  CBC: Recent Labs  Lab 06/16/20 0832 06/17/20 0540 06/17/20 2250 06/18/20 0655 06/19/20 0621  WBC 16.2* 10.8*  --  10.3 8.3  NEUTROABS 14.7*  --   --   --   --   HGB 7.4* 6.5* 8.2* 9.1* 7.5*  HCT 22.3* 19.3* 23.9* 26.0* 22.4*  MCV 78.5* 78.1*  --  76.5* 79.7*  PLT 168 149*  --  180 542   Basic Metabolic Panel: Recent Labs  Lab 06/16/20 1331 06/16/20 2031 06/17/20 0540 06/18/20 0655 06/19/20 0621  NA 131* 132* 130* 132* 131*  K 4.8 5.0 5.4* 4.5 4.3  CL 102  102 101 98 98  CO2 15* 16* 13* 16* 16*  GLUCOSE 186* 108* 88 102* 95  BUN 134* 131* 145* 127* 116*  CREATININE 15.34* 15.38* 16.44* 16.42* 14.31*  CALCIUM 7.5* 7.7* 7.7* 7.7* 7.2*   GFR: Estimated Creatinine Clearance: 6.5 mL/min (A) (by C-G formula based on SCr of 14.31 mg/dL (H)). Liver Function Tests: Recent Labs  Lab 06/16/20 0832  AST 15  ALT 30  ALKPHOS 63  BILITOT 0.7  PROT 8.5*  ALBUMIN 3.2*   No results for input(s): LIPASE, AMYLASE in the last 168 hours. No results for input(s): AMMONIA in the last 168 hours. Coagulation Profile: No results for input(s): INR, PROTIME in the last 168 hours. Cardiac Enzymes: No results for  input(s): CKTOTAL, CKMB, CKMBINDEX, TROPONINI in the last 168 hours. BNP (last 3 results) No results for input(s): PROBNP in the last 8760 hours. HbA1C: Recent Labs    06/17/20 0540  HGBA1C 4.9   CBG: No results for input(s): GLUCAP in the last 168 hours. Lipid Profile: Recent Labs    06/17/20 0540  CHOL 94  HDL 24*  LDLCALC 40  TRIG 151*  CHOLHDL 3.9   Thyroid Function Tests: No results for input(s): TSH, T4TOTAL, FREET4, T3FREE, THYROIDAB in the last 72 hours. Anemia Panel: Recent Labs    06/17/20 0002  VITAMINB12 1,376*  FOLATE 7.9  FERRITIN 200  TIBC 119*  IRON 11*  RETICCTPCT 1.9   Sepsis Labs: Recent Labs  Lab 06/16/20 0832 06/16/20 1204  PROCALCITON >150.00  --   LATICACIDVEN 0.9 0.7    Recent Results (from the past 240 hour(s))  SARS Coronavirus 2 by RT PCR (hospital order, performed in [facility] hospital lab) Nasopharyngeal Nasopharyngeal Swab     Status: None   Collection Time: 06/16/20  8:32 AM   Specimen: Nasopharyngeal Swab  Result Value Ref Range Status   SARS Coronavirus 2 NEGATIVE NEGATIVE Final    Comment: (NOTE) SARS-CoV-2 target nucleic acids are NOT DETECTED.  The SARS-CoV-2 RNA is generally detectable in upper and lower respiratory specimens during the acute phase of infection. The lowest concentration of SARS-CoV-2 viral copies this assay can detect is 250 copies / mL. A negative result does not preclude SARS-CoV-2 infection and should not be used as the sole basis for treatment or other patient management decisions.  A negative result may occur with improper specimen collection / handling, submission of specimen other than nasopharyngeal swab, presence of viral mutation(s) within the areas targeted by this assay, and inadequate number of viral copies (<250 copies / mL). A negative result must be combined with clinical observations, patient history, and epidemiological information.  Fact Sheet for Patients:     StrictlyIdeas.no  Fact Sheet for Healthcare Providers: BankingDealers.co.za  This test is not yet approved or  cleared by the [place] FDA and has been authorized for detection and/or diagnosis of SARS-CoV-2 by FDA under an Emergency Use Authorization (EUA).  This EUA will remain in effect (meaning this test can be used) for the duration of the COVID-19 declaration under Section 564(b)(1) of the Act, 21 U.S.C. section 360bbb-3(b)(1), unless the authorization is terminated or revoked sooner.  Performed at [facility], [address] [place], [place] [address]   Blood Culture (routine x 2)     Status: Abnormal   Collection Time: 06/16/20  8:32 AM   Specimen: BLOOD  Result Value Ref Range Status   Specimen Description   Final    BLOOD LEFT FORE ARM Performed  at [facility], [address] [place], [place] [address]    Special Requests   Final    BOTTLES DRAWN AEROBIC AND ANAEROBIC Blood Culture results may not be optimal due to an inadequate volume of blood received in culture bottles Performed at [facility], [address], [place], [place] [address]    Culture  Setup Time   Final    GRAM POSITIVE COCCI IN BOTH AEROBIC AND ANAEROBIC BOTTLES CRITICAL VALUE NOTED.  VALUE IS CONSISTENT WITH PREVIOUSLY REPORTED AND CALLED VALUE. Performed at [facility], [address] [place], [place] [address]    Culture (A)  Final    STREPTOCOCCUS PNEUMONIAE SUSCEPTIBILITIES PERFORMED ON PREVIOUS CULTURE WITHIN THE LAST 5 DAYS. Performed at [facility], [address] [place], [place] [address]    Report Status 06/19/2020 FINAL  Final  Blood Culture (routine x 2)     Status: Abnormal   Collection Time: 06/16/20  8:32 AM   Specimen: BLOOD  Result Value Ref Range Status   Specimen Description   Final    BLOOD LEFT FORE ARM Performed at [facility], [address],  [place], [place] [address]    Special Requests   Final    BOTTLES DRAWN AEROBIC AND ANAEROBIC Blood Culture results may not be optimal due to an inadequate volume of blood received in culture bottles Performed at [facility], [address] [place], [place] [address]    Culture  Setup Time   Final    Organism ID to follow [place] CRITICAL RESULT CALLED TO, READ BACK BY AND VERIFIED WITH: [place] 06/16/20 AT 2148 HS Performed at [facility], [place] [address], [place], [place] [address]    Culture STREPTOCOCCUS PNEUMONIAE (A)  Final   Report Status 06/19/2020 FINAL  Final   Organism ID, Bacteria STREPTOCOCCUS PNEUMONIAE  Final      Susceptibility   Streptococcus pneumoniae - MIC*    ERYTHROMYCIN >=8 RESISTANT Resistant     LEVOFLOXACIN 0.5 SENSITIVE Sensitive     VANCOMYCIN 0.5 SENSITIVE Sensitive     PENICILLIN (meningitis) 1 RESISTANT Resistant     PENO - penicillin 1      PENICILLIN (non-meningitis) 1 SENSITIVE Sensitive     PENICILLIN (oral) 1 INTERMEDIATE Intermediate     CEFTRIAXONE (non-meningitis) 1 SENSITIVE Sensitive     CEFTRIAXONE (meningitis) 1 INTERMEDIATE Intermediate     * STREPTOCOCCUS PNEUMONIAE  Blood Culture ID Panel (Reflexed)     Status: Abnormal   Collection Time: 06/16/20  8:32 AM  Result Value Ref Range Status   Enterococcus faecalis NOT DETECTED NOT DETECTED Final   Enterococcus Faecium NOT DETECTED NOT DETECTED Final   Listeria monocytogenes NOT DETECTED NOT DETECTED Final   Staphylococcus species NOT DETECTED NOT DETECTED Final   Staphylococcus aureus (BCID) NOT DETECTED NOT DETECTED Final   Staphylococcus epidermidis NOT DETECTED NOT DETECTED Final   Staphylococcus lugdunensis NOT DETECTED NOT DETECTED Final   Streptococcus species DETECTED (A) NOT DETECTED Final    Comment: CRITICAL RESULT CALLED TO, READ BACK BY AND VERIFIED WITH: DAVID BESANTI 06/16/20 AT 2148 HS    Streptococcus  agalactiae NOT DETECTED NOT DETECTED Final   Streptococcus pneumoniae DETECTED (A) NOT DETECTED Final    Comment: CRITICAL RESULT CALLED TO, READ BACK BY AND VERIFIED WITH: DAVID BESANTI 06/16/20 AT 2148 HS    Streptococcus pyogenes NOT DETECTED NOT DETECTED Final   A.calcoaceticus-baumannii  NOT DETECTED NOT DETECTED Final   Bacteroides fragilis NOT DETECTED NOT DETECTED Final   Enterobacterales NOT DETECTED NOT DETECTED Final   Enterobacter cloacae complex NOT DETECTED NOT DETECTED Final   Escherichia coli NOT DETECTED NOT DETECTED Final   Klebsiella aerogenes NOT DETECTED NOT DETECTED Final   Klebsiella oxytoca NOT DETECTED NOT DETECTED Final   Klebsiella pneumoniae NOT DETECTED NOT DETECTED Final   Proteus species NOT DETECTED NOT DETECTED Final   Salmonella species NOT DETECTED NOT DETECTED Final   Serratia marcescens NOT DETECTED NOT DETECTED Final   Haemophilus influenzae NOT DETECTED NOT DETECTED Final   Neisseria meningitidis NOT DETECTED NOT DETECTED Final   Pseudomonas aeruginosa NOT DETECTED NOT DETECTED Final   Stenotrophomonas maltophilia NOT DETECTED NOT DETECTED Final   Candida albicans NOT DETECTED NOT DETECTED Final   Candida auris NOT DETECTED NOT DETECTED Final   Candida glabrata NOT DETECTED NOT DETECTED Final   Candida krusei NOT DETECTED NOT DETECTED Final   Candida parapsilosis NOT DETECTED NOT DETECTED Final   Candida tropicalis NOT DETECTED NOT DETECTED Final   Cryptococcus neoformans/gattii NOT DETECTED NOT DETECTED Final    Comment: Performed at [facility], [address], [place],  [address]         Radiology Studies: US RENAL  Result Date: 06/17/2020 CLINICAL DATA:  End-stage renal disease EXAM: RENAL / URINARY TRACT ULTRASOUND COMPLETE COMPARISON:  None. FINDINGS: Right Kidney: Renal measurements: 8.1 x 4.3 x 3.9 cm = volume: 73 mL. The kidney is atrophic and severely echogenic. There is no hydronephrosis. Left Kidney: Renal  measurements: 6.7 x 3.8 x 3.6 cm = volume: 49 mL. The kidney is atrophic and severely echogenic. There is no hydronephrosis. Bladder: Appears normal for degree of bladder distention. Other: None. IMPRESSION: Small, echogenic kidneys bilaterally consistent with a history of end-stage renal disease. There is no hydronephrosis. Electronically Signed   By: Constance Holster M.D.   On: 06/17/2020 16:06   PERIPHERAL VASCULAR CATHETERIZATION  Result Date: 06/18/2020 See op note       Scheduled Meds: . sodium chloride   Intravenous Once  . amLODipine  10 mg Oral Daily  . aspirin EC  81 mg Oral Daily  . carvedilol  12.5 mg Oral BID WC  . Chlorhexidine Gluconate Cloth  6 each Topical Q0600  . heparin  5,000 Units Subcutaneous Q8H  . hydrALAZINE  50 mg Oral Q8H   Continuous Infusions: . cefTRIAXone (ROCEPHIN)  IV Stopped (06/18/20 1901)  . iron sucrose       LOS: 3 days    Time spent: 30 mins     Wyvonnia Dusky, MD [facility] Pager [phone]  If 7PM-7AM, please contact night-coverage [email or web address] 06/19/2020, 1:56 PM

## 2020-06-19 NOTE — Progress Notes (Signed)
Working on hemodialysis outpatient placement for [employer]. Please contact me with dialysis placement concerns.  Elvera Bicker Dialysis Coordinator [phone]

## 2020-06-19 NOTE — Progress Notes (Signed)
PT Cancellation Note  Patient Details Name: Anakin Varkey MRN: [id number] DOB: 1990/12/12   Cancelled Treatment:    Reason Eval/Treat Not Completed: Medical issues which prohibited therapy Pt's BP contraindicated mobilization today. Pt's last reported BP was 170/121 and has been trending upwards. Will attempt to see pt at a future date/time as medically appropriate.     Hervey Ard 06/19/2020, 1:16 PM

## 2020-06-19 NOTE — Progress Notes (Signed)
[facility]  ROUNDING NOTE   Subjective:   Patient is a 29 y.o. male with a PMHx of longstanding hypertension, ESRD not yet on dialysis,has significant azotemia with a BUN of 127 and creatinine of 16.42.   Update: Patient had his first dialysis treatment yesterday.  Still having some chest tightness today.  Due for his second dialysis treatment today.  Objective:  Vital signs in last 24 hours:  Temp:  [97.7 F (36.5 C)-98.8 F (37.1 C)] 98.8 F (37.1 C) (09/14 0505) Pulse Rate:  [96-110] 103 (09/14 0505) Resp:  [15-20] 16 (09/14 0505) BP: (124-163)/(94-117) 163/109 (09/14 0505) SpO2:  [96 %-100 %] 100 % (09/14 0505)  Weight change:  Filed Weights   06/16/20 2228  Weight: 60.8 kg    Intake/Output: I/O last 3 completed shifts: In: 1824.8 [P.O.:240; Blood:390; IV Piggyback:1194.8] Out: 1950 [Urine:1050; Emesis/NG output:400; Other:500]   Intake/Output this shift:  No intake/output data recorded.  Physical Exam: General:  No acute distress  Head:  Oral mucous membranes moist  Eyes:  Sclerae and conjunctivae clear  Neck:  Supple  Lungs:   Crackles + bilaterally  Heart:  Regular, slight tachycardia 107  Abdomen:   Soft, nontender, non distended  Extremities:  No peripheral edema.  Neurologic:  Awake, alert, oriented x3   Skin:  No acute lesions or rashes  Access:  R IJ permcath placed 6/83/41    Basic Metabolic Panel: Recent Labs  Lab 06/16/20 1331 06/16/20 1331 06/16/20 2031 06/16/20 2031 06/17/20 0540 06/18/20 0655 06/19/20 0621  NA 131*  --  132*  --  130* 132* 131*  K 4.8  --  5.0  --  5.4* 4.5 4.3  CL 102  --  102  --  101 98 98  CO2 15*  --  16*  --  13* 16* 16*  GLUCOSE 186*  --  108*  --  88 102* 95  BUN 134*  --  131*  --  145* 127* 116*  CREATININE 15.34*  --  15.38*  --  16.44* 16.42* 14.31*  CALCIUM 7.5*   < > 7.7*   < > 7.7* 7.7* 7.2*   < > = values in this interval not displayed.    Liver Function Tests: Recent Labs  Lab  06/16/20 0832  AST 15  ALT 30  ALKPHOS 63  BILITOT 0.7  PROT 8.5*  ALBUMIN 3.2*   No results for input(s): LIPASE, AMYLASE in the last 168 hours. No results for input(s): AMMONIA in the last 168 hours.  CBC: Recent Labs  Lab 06/16/20 0832 06/17/20 0540 06/17/20 2250 06/18/20 0655 06/19/20 0621  WBC 16.2* 10.8*  --  10.3 8.3  NEUTROABS 14.7*  --   --   --   --   HGB 7.4* 6.5* 8.2* 9.1* 7.5*  HCT 22.3* 19.3* 23.9* 26.0* 22.4*  MCV 78.5* 78.1*  --  76.5* 79.7*  PLT 168 149*  --  180 158    Cardiac Enzymes: No results for input(s): CKTOTAL, CKMB, CKMBINDEX, TROPONINI in the last 168 hours.  BNP: Invalid input(s): POCBNP  CBG: No results for input(s): GLUCAP in the last 168 hours.  Microbiology: Results for orders placed or performed during the hospital encounter of 06/16/20  SARS Coronavirus 2 by RT PCR (hospital order, performed in [facility] hospital lab) Nasopharyngeal Nasopharyngeal Swab     Status: None   Collection Time: 06/16/20  8:32 AM   Specimen: Nasopharyngeal Swab  Result Value Ref Range Status  SARS Coronavirus 2 NEGATIVE NEGATIVE Final    Comment: (NOTE) SARS-CoV-2 target nucleic acids are NOT DETECTED.  The SARS-CoV-2 RNA is generally detectable in upper and lower respiratory specimens during the acute phase of infection. The lowest concentration of SARS-CoV-2 viral copies this assay can detect is 250 copies / mL. A negative result does not preclude SARS-CoV-2 infection and should not be used as the sole basis for treatment or other patient management decisions.  A negative result may occur with improper specimen collection / handling, submission of specimen other than nasopharyngeal swab, presence of viral mutation(s) within the areas targeted by this assay, and inadequate number of viral copies (<250 copies / mL). A negative result must be combined with clinical observations, patient history, and epidemiological information.  Fact Sheet for  Patients:   StrictlyIdeas.no  Fact Sheet for Healthcare Providers: BankingDealers.co.za  This test is not yet approved or  cleared by the [place] FDA and has been authorized for detection and/or diagnosis of SARS-CoV-2 by FDA under an Emergency Use Authorization (EUA).  This EUA will remain in effect (meaning this test can be used) for the duration of the COVID-19 declaration under Section 564(b)(1) of the Act, 21 U.S.C. section 360bbb-3(b)(1), unless the authorization is terminated or revoked sooner.  Performed at [facility], [address], [place], [place] [address]   Blood Culture (routine x 2)     Status: Abnormal   Collection Time: 06/16/20  8:32 AM   Specimen: BLOOD  Result Value Ref Range Status   Specimen Description   Final    BLOOD LEFT FORE ARM Performed at [facility], [address], [place], [place] [address]    Special Requests   Final    BOTTLES DRAWN AEROBIC AND ANAEROBIC Blood Culture results may not be optimal due to an inadequate volume of blood received in culture bottles Performed at [facility], [address], [place], [place] [address]    Culture  Setup Time   Final    GRAM POSITIVE COCCI IN BOTH AEROBIC AND ANAEROBIC BOTTLES CRITICAL VALUE NOTED.  VALUE IS CONSISTENT WITH PREVIOUSLY REPORTED AND CALLED VALUE. Performed at [facility], [address] [place], [place] [address]    Culture (A)  Final    STREPTOCOCCUS PNEUMONIAE SUSCEPTIBILITIES PERFORMED ON PREVIOUS CULTURE WITHIN THE LAST 5 DAYS. Performed at [facility], [place] [address] [place], [place] [address]    Report Status 06/19/2020 FINAL  Final  Blood Culture (routine x 2)     Status: Abnormal   Collection Time: 06/16/20  8:32 AM   Specimen: BLOOD  Result Value Ref Range Status   Specimen Description   Final    BLOOD LEFT FORE ARM Performed at [facility], [address], [place], [place] [address]    Special Requests   Final    BOTTLES DRAWN AEROBIC AND ANAEROBIC Blood Culture results may not be optimal due to an inadequate volume of blood received in culture bottles Performed at [facility], [address] [place], [place] [address]    Culture  Setup Time   Final    Organism ID to follow [place] CRITICAL RESULT CALLED TO, READ BACK BY AND VERIFIED WITH: [place] 06/16/20 AT 2148 HS Performed at [facility], [place] [address] [place], [place] [address]    Culture STREPTOCOCCUS PNEUMONIAE (A)  Final   Report Status 06/19/2020 FINAL  Final  Organism ID, Bacteria STREPTOCOCCUS PNEUMONIAE  Final      Susceptibility   Streptococcus pneumoniae - MIC*    ERYTHROMYCIN >=8 RESISTANT Resistant     LEVOFLOXACIN 0.5 SENSITIVE Sensitive     VANCOMYCIN 0.5 SENSITIVE Sensitive     PENICILLIN (meningitis) 1 RESISTANT Resistant     PENO - penicillin 1      PENICILLIN (non-meningitis) 1 SENSITIVE Sensitive     PENICILLIN (oral) 1 INTERMEDIATE Intermediate     CEFTRIAXONE (non-meningitis) 1 SENSITIVE Sensitive     CEFTRIAXONE (meningitis) 1 INTERMEDIATE Intermediate     * STREPTOCOCCUS PNEUMONIAE  Blood Culture ID Panel (Reflexed)     Status: Abnormal   Collection Time: 06/16/20  8:32 AM  Result Value Ref Range Status   Enterococcus faecalis NOT DETECTED NOT DETECTED Final   Enterococcus Faecium NOT DETECTED NOT DETECTED Final   Listeria monocytogenes NOT DETECTED NOT DETECTED Final   Staphylococcus species NOT DETECTED NOT DETECTED Final   Staphylococcus aureus (BCID) NOT DETECTED NOT DETECTED Final   Staphylococcus epidermidis NOT DETECTED NOT DETECTED Final   Staphylococcus lugdunensis NOT DETECTED NOT DETECTED Final   Streptococcus species DETECTED (A) NOT DETECTED Final    Comment: CRITICAL RESULT CALLED TO, READ BACK BY AND VERIFIED WITH: DAVID BESANTI 06/16/20 AT 2148 HS     Streptococcus agalactiae NOT DETECTED NOT DETECTED Final   Streptococcus pneumoniae DETECTED (A) NOT DETECTED Final    Comment: CRITICAL RESULT CALLED TO, READ BACK BY AND VERIFIED WITH: DAVID BESANTI 06/16/20 AT 2148 HS    Streptococcus pyogenes NOT DETECTED NOT DETECTED Final   A.calcoaceticus-baumannii NOT DETECTED NOT DETECTED Final   Bacteroides fragilis NOT DETECTED NOT DETECTED Final   Enterobacterales NOT DETECTED NOT DETECTED Final   Enterobacter cloacae complex NOT DETECTED NOT DETECTED Final   Escherichia coli NOT DETECTED NOT DETECTED Final   Klebsiella aerogenes NOT DETECTED NOT DETECTED Final   Klebsiella oxytoca NOT DETECTED NOT DETECTED Final   Klebsiella pneumoniae NOT DETECTED NOT DETECTED Final   Proteus species NOT DETECTED NOT DETECTED Final   Salmonella species NOT DETECTED NOT DETECTED Final   Serratia marcescens NOT DETECTED NOT DETECTED Final   Haemophilus influenzae NOT DETECTED NOT DETECTED Final   Neisseria meningitidis NOT DETECTED NOT DETECTED Final   Pseudomonas aeruginosa NOT DETECTED NOT DETECTED Final   Stenotrophomonas maltophilia NOT DETECTED NOT DETECTED Final   Candida albicans NOT DETECTED NOT DETECTED Final   Candida auris NOT DETECTED NOT DETECTED Final   Candida glabrata NOT DETECTED NOT DETECTED Final   Candida krusei NOT DETECTED NOT DETECTED Final   Candida parapsilosis NOT DETECTED NOT DETECTED Final   Candida tropicalis NOT DETECTED NOT DETECTED Final   Cryptococcus neoformans/gattii NOT DETECTED NOT DETECTED Final    Comment: Performed at [facility], [address] [place], [place] [address]    Coagulation Studies: No results for input(s): LABPROT, INR in the last 72 hours.  Urinalysis: No results for input(s): COLORURINE, LABSPEC, PHURINE, GLUCOSEU, HGBUR, BILIRUBINUR, KETONESUR, PROTEINUR, UROBILINOGEN, NITRITE, LEUKOCYTESUR in the last 72 hours.  Invalid input(s): APPERANCEUR    Imaging: US RENAL  Result Date:  06/17/2020 CLINICAL DATA:  End-stage renal disease EXAM: RENAL / URINARY TRACT ULTRASOUND COMPLETE COMPARISON:  None. FINDINGS: Right Kidney: Renal measurements: 8.1 x 4.3 x 3.9 cm = volume: 73 mL. The kidney is atrophic and severely echogenic. There is no hydronephrosis. Left Kidney: Renal measurements: 6.7 x 3.8 x 3.6 cm = volume: 49 mL. The kidney is atrophic and severely  echogenic. There is no hydronephrosis. Bladder: Appears normal for degree of bladder distention. Other: None. IMPRESSION: Small, echogenic kidneys bilaterally consistent with a history of end-stage renal disease. There is no hydronephrosis. Electronically Signed   By: Constance Holster M.D.   On: 06/17/2020 16:06   PERIPHERAL VASCULAR CATHETERIZATION  Result Date: 06/18/2020 See op note    Medications:   . cefTRIAXone (ROCEPHIN)  IV Stopped (06/18/20 1901)  . iron sucrose Stopped (06/17/20 1541)   . sodium chloride   Intravenous Once  . amLODipine  10 mg Oral Daily  . aspirin EC  81 mg Oral Daily  . carvedilol  12.5 mg Oral BID WC  . Chlorhexidine Gluconate Cloth  6 each Topical Q0600  . heparin  5,000 Units Subcutaneous Q8H  . hydrALAZINE  50 mg Oral Q8H  . patiromer  16.8 g Oral Daily   acetaminophen, chlorpheniramine-HYDROcodone, hydrALAZINE, HYDROmorphone (DILAUDID) injection, labetalol, morphine injection, ondansetron (ZOFRAN) IV, ondansetron (ZOFRAN) IV, oxyCODONE-acetaminophen, promethazine  Assessment/ Plan:  29 y.o. male  with a PMHx of longstanding hypertension, ESRD not yet on dialysis, anemia of chronic kidney disease, secondary hyperparathyroidism, who was admitted to [facility] on 06/16/2020 for evaluation of shortness of breath and chest pain.   1.  ESRD not yet on dialysis.   Patient was last seen by nephrologist in [place] approximately 1 year ago.  He was told at that time that he needed renal placement therapy but does not yet have dialysis access given delays in care secondary to the pandemic.    -Patient did initiate hemodialysis treatment yesterday.  Right internal jugular PermCath was placed and patient tolerated procedure well.  Appreciate vascular surgery assistance.  We plan for second dialysis treatment today with ultrafiltration target of 1 kg.  2.  Hypertension.   Blood pressure still a bit high at 163/109.  Maintain the patient on amlodipine, drowsing, labetalol, and carvedilol.  Should come down with ongoing ultrafiltration.  3.  Anemia of chronic kidney disease.  Hemoglobin up to 7.5 posttransfusion.  Recommend avoiding further blood transfusions.  4.  Hyperkalemia.  Potassium now normalized at 4.3.  We will go ahead and stop Veltassa now.   LOS: 3 Adna Nofziger 9/14/20219:38 AM

## 2020-06-20 ENCOUNTER — Encounter: Payer: Self-pay | Admitting: Internal Medicine

## 2020-06-20 DIAGNOSIS — Z992 Dependence on renal dialysis: Secondary | ICD-10-CM

## 2020-06-20 DIAGNOSIS — A403 Sepsis due to Streptococcus pneumoniae: Principal | ICD-10-CM

## 2020-06-20 LAB — BASIC METABOLIC PANEL
Anion gap: 15 (ref 5–15)
BUN: 74 mg/dL — ABNORMAL HIGH (ref 6–20)
CO2: 21 mmol/L — ABNORMAL LOW (ref 22–32)
Calcium: 8 mg/dL — ABNORMAL LOW (ref 8.9–10.3)
Chloride: 97 mmol/L — ABNORMAL LOW (ref 98–111)
Creatinine, Ser: 10.51 mg/dL — ABNORMAL HIGH (ref 0.61–1.24)
GFR calc Af Amer: 7 mL/min — ABNORMAL LOW (ref >60)
GFR calc non Af Amer: 6 mL/min — ABNORMAL LOW (ref >60)
Glucose, Bld: 108 mg/dL — ABNORMAL HIGH (ref 70–99)
Potassium: 4 mmol/L (ref 3.5–5.1)
Sodium: 133 mmol/L — ABNORMAL LOW (ref 135–145)

## 2020-06-20 LAB — CBC
HCT: 26.6 % — ABNORMAL LOW (ref 39.0–52.0)
Hemoglobin: 9.3 g/dL — ABNORMAL LOW (ref 13.0–17.0)
MCH: 27.6 pg (ref 26.0–34.0)
MCHC: 35 g/dL (ref 30.0–36.0)
MCV: 78.9 fL — ABNORMAL LOW (ref 80.0–100.0)
Platelets: 220 10*3/uL (ref 150–400)
RBC: 3.37 MIL/uL — ABNORMAL LOW (ref 4.22–5.81)
RDW: 16.9 % — ABNORMAL HIGH (ref 11.5–15.5)
WBC: 9.3 10*3/uL (ref 4.0–10.5)
nRBC: 0.3 % — ABNORMAL HIGH (ref 0.0–0.2)

## 2020-06-20 LAB — GLOMERULAR BASEMENT MEMBRANE ANTIBODIES: GBM Ab: 3 units (ref 0–20)

## 2020-06-20 MED ORDER — LOSARTAN POTASSIUM 50 MG PO TABS
100.0000 mg | ORAL_TABLET | Freq: Every day | ORAL | Status: DC
  Administered 2020-06-21 – 2020-06-27 (×7): 100 mg via ORAL
  Filled 2020-06-20 (×9): qty 2

## 2020-06-20 MED ORDER — CARVEDILOL 25 MG PO TABS
25.0000 mg | ORAL_TABLET | Freq: Two times a day (BID) | ORAL | Status: DC
  Administered 2020-06-20 – 2020-07-06 (×22): 25 mg via ORAL
  Filled 2020-06-20 (×25): qty 1

## 2020-06-20 NOTE — Progress Notes (Signed)
ID Pt says he is feeling better Back pain and chest pain better Cough + No fever Had dialysis today Patient Vitals for the past 24 hrs:  BP Temp Temp src Pulse Resp SpO2  06/20/20 1230 (!) 152/112 -- -- (!) 123 20 --  06/20/20 1215 (!) 163/117 -- -- (!) 107 15 99 %  06/20/20 1200 (!) 168/136 -- -- (!) 105 16 99 %  06/20/20 1145 (!) 161/112 -- -- (!) 104 19 100 %  06/20/20 1130 (!) 160/118 -- -- (!) 105 17 98 %  06/20/20 1115 (!) 165/119 -- -- -- 17 98 %  06/20/20 1102 -- -- -- (!) 138 -- 98 %  06/20/20 1100 (!) 173/126 -- -- (!) 115 (!) 27 99 %  06/20/20 1045 (!) 165/118 -- -- (!) 111 20 98 %  06/20/20 1030 (!) 160/119 -- -- (!) 107 19 99 %  06/20/20 1015 (!) 151/112 -- -- (!) 113 18 98 %  06/20/20 1010 (!) 151/112 98.8 F (37.1 C) Oral 100 17 98 %  06/20/20 0459 (!) 168/115 98.6 F (37 C) Oral (!) 108 18 98 %  06/19/20 2016 134/89 98.2 F (36.8 C) Oral 100 18 97 %  06/19/20 1506 (!) 152/104 98.1 F (36.7 C) Oral (!) 120 18 96 %  06/19/20 1354 (!) 157/117 -- -- (!) 105 -- 99 %   O/e Awake and alert Chest b/l air entry- creps left [id number] abd soft CNS non focal  CBC Latest Ref Rng & Units 06/20/2020 06/19/2020 06/18/2020  WBC 4.0 - 10.5 K/uL 9.3 8.3 10.3  Hemoglobin 13.0 - 17.0 g/dL 9.3(L) 7.5(L) 9.1(L)  Hematocrit 39 - 52 % 26.6(L) 22.4(L) 26.0(L)  Platelets 150 - 400 K/uL 220 158 180   CMP Latest Ref Rng & Units 06/20/2020 06/19/2020 06/18/2020  Glucose 70 - 99 mg/dL 108(H) 95 102(H)  BUN 6 - 20 mg/dL 74(H) 116(H) 127(H)  Creatinine 0.61 - 1.24 mg/dL 10.51(H) 14.31(H) 16.42(H)  Sodium 135 - 145 mmol/L 133(L) 131(L) 132(L)  Potassium 3.5 - 5.1 mmol/L 4.0 4.3 4.5  Chloride 98 - 111 mmol/L 97(L) 98 98  CO2 22 - 32 mmol/L 21(L) 16(L) 16(L)  Calcium 8.9 - 10.3 mg/dL 8.0(L) 7.2(L) 7.7(L)  Total Protein 6.5 - 8.1 g/dL - - -  Total Bilirubin 0.3 - 1.2 mg/dL - - -  Alkaline Phos 38 - 126 U/L - - -  AST 15 - 41 U/L - - -  ALT 0 - 44 U/L - - -   cd4  74 ( 12%) VL  14.900 Impression/recommendation  Strep pneumo bacteremia with left lung pneumonia On ceftriaxone  HIV/AIDS- non compliant- Cd4 is 74 ( 12%) and Vl [id number] Have left message with his provider in [place] for more information Currently on bactrim for PCP prophylaxis  ESRD started dialysis  Accelerated HTN Discussed the management with care team

## 2020-06-20 NOTE — Progress Notes (Signed)
[facility]  ROUNDING NOTE   Subjective:   Patient is a 29 y.o. male with a PMHx of longstanding hypertension, ESRD not yet on dialysis,has significant azotemia with a BUN of 127 and creatinine of 16.42.   Update: Patient has undergone 2 dialysis treatments.  Tolerating these treatments well.  Due for third dialysis treatment today.  Objective:  Vital signs in last 24 hours:  Temp:  [98.1 F (36.7 C)-98.6 F (37 C)] 98.6 F (37 C) (09/15 0459) Pulse Rate:  [96-120] 108 (09/15 0459) Resp:  [15-20] 18 (09/15 0459) BP: (134-170)/(89-126) 168/115 (09/15 0459) SpO2:  [96 %-100 %] 98 % (09/15 0459)  Weight change:  Filed Weights   06/16/20 2228  Weight: 60.8 kg    Intake/Output: I/O last 3 completed shifts: In: 1874.8 [P.O.:680; IV Piggyback:1194.8] Out: 2125 [Urine:2125]   Intake/Output this shift:  No intake/output data recorded.  Physical Exam: General:  No acute distress  Head:  Oral mucous membranes moist  Eyes:  Sclerae and conjunctivae clear  Neck:  Supple  Lungs:   Bilateral rales  Heart:  Regular but tachycardic.  Abdomen:   Soft, nontender, non distended  Extremities:  No peripheral edema.  Neurologic:  Awake, alert, oriented x3   Skin:  No acute lesions or rashes  Access:  R IJ permcath placed 1/61/09    Basic Metabolic Panel: Recent Labs  Lab 06/16/20 2031 06/16/20 2031 06/17/20 0540 06/17/20 0540 06/18/20 0655 06/19/20 0621 06/20/20 0543  NA 132*  --  130*  --  132* 131* 133*  K 5.0  --  5.4*  --  4.5 4.3 4.0  CL 102  --  101  --  98 98 97*  CO2 16*  --  13*  --  16* 16* 21*  GLUCOSE 108*  --  88  --  102* 95 108*  BUN 131*  --  145*  --  127* 116* 74*  CREATININE 15.38*  --  16.44*  --  16.42* 14.31* 10.51*  CALCIUM 7.7*   < > 7.7*   < > 7.7* 7.2* 8.0*   < > = values in this interval not displayed.    Liver Function Tests: Recent Labs  Lab 06/16/20 0832  AST 15  ALT 30  ALKPHOS 63  BILITOT 0.7  PROT 8.5*  ALBUMIN 3.2*    No results for input(s): LIPASE, AMYLASE in the last 168 hours. No results for input(s): AMMONIA in the last 168 hours.  CBC: Recent Labs  Lab 06/16/20 0832 06/16/20 0832 06/17/20 0540 06/17/20 2250 06/18/20 0655 06/19/20 0621 06/20/20 0543  WBC 16.2*  --  10.8*  --  10.3 8.3 9.3  NEUTROABS 14.7*  --   --   --   --   --   --   HGB 7.4*   < > 6.5* 8.2* 9.1* 7.5* 9.3*  HCT 22.3*   < > 19.3* 23.9* 26.0* 22.4* 26.6*  MCV 78.5*  --  78.1*  --  76.5* 79.7* 78.9*  PLT 168  --  149*  --  180 158 220   < > = values in this interval not displayed.    Cardiac Enzymes: No results for input(s): CKTOTAL, CKMB, CKMBINDEX, TROPONINI in the last 168 hours.  BNP: Invalid input(s): POCBNP  CBG: No results for input(s): GLUCAP in the last 168 hours.  Microbiology: Results for orders placed or performed during the hospital encounter of 06/16/20  SARS Coronavirus 2 by RT PCR (hospital order, performed in [facility] hospital lab) Nasopharyngeal Nasopharyngeal Swab     Status: None   Collection Time: 06/16/20  8:32 AM   Specimen: Nasopharyngeal Swab  Result Value Ref Range Status   SARS Coronavirus 2 NEGATIVE NEGATIVE Final    Comment: (NOTE) SARS-CoV-2 target nucleic acids are NOT DETECTED.  The SARS-CoV-2 RNA is generally detectable in upper and lower respiratory specimens during the acute phase of infection. The lowest concentration of SARS-CoV-2 viral copies this assay can detect is 250 copies / mL. A negative result does not preclude SARS-CoV-2 infection and should not be used as the sole basis for treatment or other patient management decisions.  A negative result may occur with improper specimen collection / handling, submission of specimen other than nasopharyngeal swab, presence of viral mutation(s) within the areas targeted by this assay, and inadequate number of viral copies (<250 copies / mL). A negative result must be combined with clinical observations, patient history,  and epidemiological information.  Fact Sheet for Patients:   StrictlyIdeas.no  Fact Sheet for Healthcare Providers: BankingDealers.co.za  This test is not yet approved or  cleared by the [place] FDA and has been authorized for detection and/or diagnosis of SARS-CoV-2 by FDA under an Emergency Use Authorization (EUA).  This EUA will remain in effect (meaning this test can be used) for the duration of the COVID-19 declaration under Section 564(b)(1) of the Act, 21 U.S.C. section 360bbb-3(b)(1), unless the authorization is terminated or revoked sooner.  Performed at [facility], [address] [place], [place] [address]   Blood Culture (routine x 2)     Status: Abnormal   Collection Time: 06/16/20  8:32 AM   Specimen: BLOOD  Result Value Ref Range Status   Specimen Description   Final    BLOOD LEFT FORE ARM Performed at [facility], [address], [place], [place] [address]    Special Requests   Final    BOTTLES DRAWN AEROBIC AND ANAEROBIC Blood Culture results may not be optimal due to an inadequate volume of blood received in culture bottles Performed at [facility], [address] [place], [place] [address]    Culture  Setup Time   Final    GRAM POSITIVE COCCI IN BOTH AEROBIC AND ANAEROBIC BOTTLES CRITICAL VALUE NOTED.  VALUE IS CONSISTENT WITH PREVIOUSLY REPORTED AND CALLED VALUE. Performed at [facility], [address] [place], [place] [address]    Culture (A)  Final    STREPTOCOCCUS PNEUMONIAE SUSCEPTIBILITIES PERFORMED ON PREVIOUS CULTURE WITHIN THE LAST 5 DAYS. Performed at [facility], [place] [address], [place], [place] [address]    Report Status 06/19/2020 FINAL  Final  Blood Culture (routine x 2)     Status: Abnormal   Collection Time: 06/16/20  8:32 AM   Specimen: BLOOD  Result Value Ref Range Status   Specimen Description   Final    BLOOD LEFT FORE  ARM Performed at [facility], [address] [place], [place] [address]    Special Requests   Final    BOTTLES DRAWN AEROBIC AND ANAEROBIC Blood Culture results may not be optimal due to an inadequate volume of blood received in culture bottles Performed at [facility], [address] [place], [place] [address]    Culture  Setup Time   Final    Organism ID to follow [place] AND ANAEROBIC BOTTLES CRITICAL RESULT CALLED TO, READ BACK BY AND VERIFIED WITH: DAVID BESANTI 06/16/20  AT 2148 HS Performed at [facility], [address] [place], [place] [address]    Culture STREPTOCOCCUS PNEUMONIAE (A)  Final   Report Status 06/19/2020 FINAL  Final   Organism ID, Bacteria STREPTOCOCCUS PNEUMONIAE  Final      Susceptibility   Streptococcus pneumoniae - MIC*    ERYTHROMYCIN >=8 RESISTANT Resistant     LEVOFLOXACIN 0.5 SENSITIVE Sensitive     VANCOMYCIN 0.5 SENSITIVE Sensitive     PENICILLIN (meningitis) 1 RESISTANT Resistant     PENO - penicillin 1      PENICILLIN (non-meningitis) 1 SENSITIVE Sensitive     PENICILLIN (oral) 1 INTERMEDIATE Intermediate     CEFTRIAXONE (non-meningitis) 1 SENSITIVE Sensitive     CEFTRIAXONE (meningitis) 1 INTERMEDIATE Intermediate     * STREPTOCOCCUS PNEUMONIAE  Blood Culture ID Panel (Reflexed)     Status: Abnormal   Collection Time: 06/16/20  8:32 AM  Result Value Ref Range Status   Enterococcus faecalis NOT DETECTED NOT DETECTED Final   Enterococcus Faecium NOT DETECTED NOT DETECTED Final   Listeria monocytogenes NOT DETECTED NOT DETECTED Final   Staphylococcus species NOT DETECTED NOT DETECTED Final   Staphylococcus aureus (BCID) NOT DETECTED NOT DETECTED Final   Staphylococcus epidermidis NOT DETECTED NOT DETECTED Final   Staphylococcus lugdunensis NOT DETECTED NOT DETECTED Final   Streptococcus species DETECTED (A) NOT DETECTED Final    Comment: CRITICAL RESULT CALLED TO, READ BACK BY AND  VERIFIED WITH: DAVID BESANTI 06/16/20 AT 2148 HS    Streptococcus agalactiae NOT DETECTED NOT DETECTED Final   Streptococcus pneumoniae DETECTED (A) NOT DETECTED Final    Comment: CRITICAL RESULT CALLED TO, READ BACK BY AND VERIFIED WITH: DAVID BESANTI 06/16/20 AT 2148 HS    Streptococcus pyogenes NOT DETECTED NOT DETECTED Final   A.calcoaceticus-baumannii NOT DETECTED NOT DETECTED Final   Bacteroides fragilis NOT DETECTED NOT DETECTED Final   Enterobacterales NOT DETECTED NOT DETECTED Final   Enterobacter cloacae complex NOT DETECTED NOT DETECTED Final   Escherichia coli NOT DETECTED NOT DETECTED Final   Klebsiella aerogenes NOT DETECTED NOT DETECTED Final   Klebsiella oxytoca NOT DETECTED NOT DETECTED Final   Klebsiella pneumoniae NOT DETECTED NOT DETECTED Final   Proteus species NOT DETECTED NOT DETECTED Final   Salmonella species NOT DETECTED NOT DETECTED Final   Serratia marcescens NOT DETECTED NOT DETECTED Final   Haemophilus influenzae NOT DETECTED NOT DETECTED Final   Neisseria meningitidis NOT DETECTED NOT DETECTED Final   Pseudomonas aeruginosa NOT DETECTED NOT DETECTED Final   Stenotrophomonas maltophilia NOT DETECTED NOT DETECTED Final   Candida albicans NOT DETECTED NOT DETECTED Final   Candida auris NOT DETECTED NOT DETECTED Final   Candida glabrata NOT DETECTED NOT DETECTED Final   Candida krusei NOT DETECTED NOT DETECTED Final   Candida parapsilosis NOT DETECTED NOT DETECTED Final   Candida tropicalis NOT DETECTED NOT DETECTED Final   Cryptococcus neoformans/gattii NOT DETECTED NOT DETECTED Final    Comment: Performed at [facility], [address] [place], [place] [address]    Coagulation Studies: No results for input(s): LABPROT, INR in the last 72 hours.  Urinalysis: No results for input(s): COLORURINE, LABSPEC, PHURINE, GLUCOSEU, HGBUR, BILIRUBINUR, KETONESUR, PROTEINUR, UROBILINOGEN, NITRITE, LEUKOCYTESUR in the last 72 hours.  Invalid input(s):  APPERANCEUR    Imaging: PERIPHERAL VASCULAR CATHETERIZATION  Result Date: 06/18/2020 See op note    Medications:   . cefTRIAXone (ROCEPHIN)  IV 2 g (06/19/20 1847)  . iron sucrose 150 mg (06/19/20 1514)   .  sodium chloride   Intravenous Once  . amLODipine  10 mg Oral Daily  . aspirin EC  81 mg Oral Daily  . carvedilol  12.5 mg Oral BID WC  . Chlorhexidine Gluconate Cloth  6 each Topical Q0600  . heparin  5,000 Units Subcutaneous Q8H  . hydrALAZINE  50 mg Oral Q8H  . sulfamethoxazole-trimethoprim  1 tablet Oral Daily   acetaminophen, chlorpheniramine-HYDROcodone, hydrALAZINE, HYDROmorphone (DILAUDID) injection, labetalol, morphine injection, ondansetron (ZOFRAN) IV, ondansetron (ZOFRAN) IV, oxyCODONE-acetaminophen, promethazine  Assessment/ Plan:  29 y.o. male  with a PMHx of longstanding hypertension, ESRD not yet on dialysis, anemia of chronic kidney disease, secondary hyperparathyroidism, who was admitted to [facility] on 06/16/2020 for evaluation of shortness of breath and chest pain.   1.  ESRD not yet on dialysis.   Patient was last seen by nephrologist in [place] approximately 1 year ago.  He was told at that time that he needed renal placement therapy but does not yet have dialysis access given delays in care secondary to the pandemic.   -Patient has had 2 dialysis treatments thus far.  Tolerating well.  Due for third dialysis treatment today.  Outpatient hemodialysis placement to be performed locally as patient intends to stay here for at least 1 month.  2.  Hypertension.   Blood pressure remains high.  Add losartan 100 mg daily in addition to amlodipine, hydralazine, carvedilol.  3.  Anemia of chronic kidney disease.  Hemoglobin now up to 9.3.  Avoid further blood transfusions as possible..  4.  Hyperkalemia.  Potassium currently 4.0 and acceptable.   LOS: 4 Kyrra Prada 9/15/20219:39 AM

## 2020-06-20 NOTE — Progress Notes (Signed)
PT Cancellation Note  Patient Details Name: Daniel Combs MRN: [id number] DOB: Jul 24, 1991   Cancelled Treatment:    Reason Eval/Treat Not Completed: Medical issues which prohibited therapy: Pt's BP 168/115 falling outside guidelines for participation with PT services. Will attempt to see pt at a future date/time as medically appropriate.     Linus Salmons PT, DPT 06/20/20, 8:52 AM

## 2020-06-20 NOTE — Progress Notes (Signed)
Progress Note    Sha Amer  QQI:[id number] DOB: 1991-09-22  DOA: 06/16/2020 PCP: Pcp, No      Brief Narrative:    Medical records reviewed and are as summarized below:  Male Minish is a 29 y.o. male       Assessment/Plan:   Principal Problem:   CAP (community acquired pneumonia) Active Problems:   Hypertension   ESRD (end stage renal disease) ([place])   Sepsis ([place])   Hyponatremia   Hyperkalemia   Elevated troponin   Anemia in ESRD (end-stage renal disease) ([facility]) Strep pneumo bacteremia-strep pneumo antigen positive and strep pneumo in blood HIV infection Immunocompromised state-CD4 count of 74  Body mass index is 21.63 kg/m.    PLAN  Continue IV Rocephin Follow-up with nephrologist with hemodialysis Continue antihypertensives He is on IV iron for anemia of chronic disease. Continue Bactrim for Bactrim and toxoplasma prophylaxis Follow-up with ID specialist for further recommendations.    Diet Order            Diet renal with fluid restriction Fluid restriction: 1200 mL Fluid; Room service appropriate? Yes; Fluid consistency: Thin  Diet effective now                    Consultants:  Nephrologist  Infectious disease  Procedures:  Placement of tunneled hemodialysis catheter via right IJ vein on 06/18/2020  Hemodialysis    Medications:   . sodium chloride   Intravenous Once  . amLODipine  10 mg Oral Daily  . aspirin EC  81 mg Oral Daily  . carvedilol  25 mg Oral BID WC  . Chlorhexidine Gluconate Cloth  6 each Topical Q0600  . heparin  5,000 Units Subcutaneous Q8H  . hydrALAZINE  50 mg Oral Q8H  . losartan  100 mg Oral Daily  . sulfamethoxazole-trimethoprim  1 tablet Oral Daily   Continuous Infusions: . cefTRIAXone (ROCEPHIN)  IV 2 g (06/19/20 1847)  . iron sucrose 150 mg (06/19/20 1514)     Anti-infectives (From admission, onward)   Start     Dose/Rate Route Frequency Ordered Stop   06/19/20 2000   sulfamethoxazole-trimethoprim (BACTRIM) 400-80 MG per tablet 1 tablet        1 tablet Oral Daily 06/19/20 1729     06/16/20 1130  cefTRIAXone (ROCEPHIN) 2 g in sodium chloride 0.9 % 100 mL IVPB        2 g 200 mL/hr over 30 Minutes Intravenous Every 24 hours 06/16/20 1123     06/16/20 1130  azithromycin (ZITHROMAX) 500 mg in sodium chloride 0.9 % 250 mL IVPB  Status:  Discontinued        500 mg 250 mL/hr over 60 Minutes Intravenous Every 24 hours 06/16/20 1123 06/18/20 2016             Family Communication/Anticipated D/C date and plan/Code Status   DVT prophylaxis: Place and maintain sequential compression device Start: 06/17/20 0741 heparin injection 5,000 Units Start: 06/16/20 1400     Code Status: Full Code  Family Communication: Plan discussed with patient Disposition Plan:    Status is: Inpatient  Remains inpatient appropriate because:IV treatments appropriate due to intensity of illness or inability to take PO   Dispo: The patient is from: Home              Anticipated d/c is to: Home              Anticipated d/c date is: 1 day  Patient currently is not medically stable to d/c.  Awaiting final recommendations from ID.  Patient has to get an outpatient hemodialysis spot prior to discharge.           Subjective:   No cough, shortness of breath or chest pain.  Objective:    Vitals:   06/20/20 1145 06/20/20 1200 06/20/20 1215 06/20/20 1230  BP: (!) 161/112 (!) 168/136 (!) 163/117 (!) 152/112  Pulse: (!) 104 (!) 105 (!) 107 (!) 123  Resp: 19 16 15 20   Temp:      TempSrc:      SpO2: 100% 99% 99%   Weight:      Height:       No data found.   Intake/Output Summary (Last 24 hours) at 06/20/2020 1344 Last data filed at 06/20/2020 0522 Gross per 24 hour  Intake 480 ml  Output 1200 ml  Net -720 ml   Filed Weights   06/16/20 2228  Weight: 60.8 kg    Exam:  GEN: NAD SKIN: No rash EYES: EOMI ENT: MMM CV: RRR PULM: CTA B ABD:  soft, ND, NT, +BS CNS: AAO x 3, non focal EXT: No edema or tenderness   Data Reviewed:   I have personally reviewed following labs and imaging studies:  Labs: Labs show the following:   Basic Metabolic Panel: Recent Labs  Lab 06/16/20 2031 06/16/20 2031 06/17/20 0540 06/17/20 0540 06/18/20 0655 06/18/20 0655 06/19/20 0621 06/20/20 0543  NA 132*  --  130*  --  132*  --  131* 133*  K 5.0   < > 5.4*   < > 4.5   < > 4.3 4.0  CL 102  --  101  --  98  --  98 97*  CO2 16*  --  13*  --  16*  --  16* 21*  GLUCOSE 108*  --  88  --  102*  --  95 108*  BUN 131*  --  145*  --  127*  --  116* 74*  CREATININE 15.38*  --  16.44*  --  16.42*  --  14.31* 10.51*  CALCIUM 7.7*  --  7.7*  --  7.7*  --  7.2* 8.0*   < > = values in this interval not displayed.   GFR Estimated Creatinine Clearance: 8.8 mL/min (A) (by C-G formula based on SCr of 10.51 mg/dL (H)). Liver Function Tests: Recent Labs  Lab 06/16/20 0832  AST 15  ALT 30  ALKPHOS 63  BILITOT 0.7  PROT 8.5*  ALBUMIN 3.2*   No results for input(s): LIPASE, AMYLASE in the last 168 hours. No results for input(s): AMMONIA in the last 168 hours. Coagulation profile No results for input(s): INR, PROTIME in the last 168 hours.  CBC: Recent Labs  Lab 06/16/20 0832 06/16/20 0832 06/17/20 0540 06/17/20 2250 06/18/20 0655 06/19/20 0621 06/20/20 0543  WBC 16.2*  --  10.8*  --  10.3 8.3 9.3  NEUTROABS 14.7*  --   --   --   --   --   --   HGB 7.4*   < > 6.5* 8.2* 9.1* 7.5* 9.3*  HCT 22.3*   < > 19.3* 23.9* 26.0* 22.4* 26.6*  MCV 78.5*  --  78.1*  --  76.5* 79.7* 78.9*  PLT 168  --  149*  --  180 158 220   < > = values in this interval not displayed.   Cardiac Enzymes: No results for input(s): CKTOTAL, CKMB, CKMBINDEX,  TROPONINI in the last 168 hours. BNP (last 3 results) No results for input(s): PROBNP in the last 8760 hours. CBG: No results for input(s): GLUCAP in the last 168 hours. D-Dimer: No results for input(s):  DDIMER in the last 72 hours. Hgb A1c: No results for input(s): HGBA1C in the last 72 hours. Lipid Profile: No results for input(s): CHOL, HDL, LDLCALC, TRIG, CHOLHDL, LDLDIRECT in the last 72 hours. Thyroid function studies: No results for input(s): TSH, T4TOTAL, T3FREE, THYROIDAB in the last 72 hours.  Invalid input(s): FREET3 Anemia work up: No results for input(s): VITAMINB12, FOLATE, FERRITIN, TIBC, IRON, RETICCTPCT in the last 72 hours. Sepsis Labs: Recent Labs  Lab 06/16/20 0832 06/16/20 0832 06/16/20 1204 06/17/20 0540 06/18/20 0655 06/19/20 0621 06/20/20 0543  PROCALCITON >150.00  --   --   --   --   --   --   WBC 16.2*   < >  --  10.8* 10.3 8.3 9.3  LATICACIDVEN 0.9  --  0.7  --   --   --   --    < > = values in this interval not displayed.    Microbiology Recent Results (from the past 240 hour(s))  SARS Coronavirus 2 by RT PCR (hospital order, performed in [facility] hospital lab) Nasopharyngeal Nasopharyngeal Swab     Status: None   Collection Time: 06/16/20  8:32 AM   Specimen: Nasopharyngeal Swab  Result Value Ref Range Status   SARS Coronavirus 2 NEGATIVE NEGATIVE Final    Comment: (NOTE) SARS-CoV-2 target nucleic acids are NOT DETECTED.  The SARS-CoV-2 RNA is generally detectable in upper and lower respiratory specimens during the acute phase of infection. The lowest concentration of SARS-CoV-2 viral copies this assay can detect is 250 copies / mL. A negative result does not preclude SARS-CoV-2 infection and should not be used as the sole basis for treatment or other patient management decisions.  A negative result may occur with improper specimen collection / handling, submission of specimen other than nasopharyngeal swab, presence of viral mutation(s) within the areas targeted by this assay, and inadequate number of viral copies (<250 copies / mL). A negative result must be combined with clinical observations, patient history, and epidemiological  information.  Fact Sheet for Patients:   StrictlyIdeas.no  Fact Sheet for Healthcare Providers: BankingDealers.co.za  This test is not yet approved or  cleared by the [place] FDA and has been authorized for detection and/or diagnosis of SARS-CoV-2 by FDA under an Emergency Use Authorization (EUA).  This EUA will remain in effect (meaning this test can be used) for the duration of the COVID-19 declaration under Section 564(b)(1) of the Act, 21 U.S.C. section 360bbb-3(b)(1), unless the authorization is terminated or revoked sooner.  Performed at [facility], [address], [place], [place] [address]   Blood Culture (routine x 2)     Status: Abnormal   Collection Time: 06/16/20  8:32 AM   Specimen: BLOOD  Result Value Ref Range Status   Specimen Description   Final    BLOOD LEFT FORE ARM Performed at [facility], [address], [place], [place] [address]    Special Requests   Final    BOTTLES DRAWN AEROBIC AND ANAEROBIC Blood Culture results may not be optimal due to an inadequate volume of blood received in culture bottles Performed at [facility], [address], [place], [place] [address]    Culture  Setup Time   Final    GRAM POSITIVE  COCCI IN BOTH AEROBIC AND ANAEROBIC BOTTLES CRITICAL VALUE NOTED.  VALUE IS CONSISTENT WITH PREVIOUSLY REPORTED AND CALLED VALUE. Performed at [facility], [address] [place], [place] [address]    Culture (A)  Final    STREPTOCOCCUS PNEUMONIAE SUSCEPTIBILITIES PERFORMED ON PREVIOUS CULTURE WITHIN THE LAST 5 DAYS. Performed at [facility], [place] [address], [place], [place] [address]    Report Status 06/19/2020 FINAL  Final  Blood Culture (routine x 2)     Status: Abnormal   Collection Time: 06/16/20  8:32 AM   Specimen: BLOOD  Result Value Ref Range Status   Specimen Description   Final    BLOOD LEFT FORE ARM Performed at [facility], [address], [place], [place] [address]    Special Requests   Final    BOTTLES DRAWN AEROBIC AND ANAEROBIC Blood Culture results may not be optimal due to an inadequate volume of blood received in culture bottles Performed at [facility], [address] [place], [place] [address]    Culture  Setup Time   Final    Organism ID to follow [place] CRITICAL RESULT CALLED TO, READ BACK BY AND VERIFIED WITH: [place] 06/16/20 AT 2148 HS Performed at [facility], [place] [address] [place], [place] [address]    Culture STREPTOCOCCUS PNEUMONIAE (A)  Final   Report Status 06/19/2020 FINAL  Final   Organism ID, Bacteria STREPTOCOCCUS PNEUMONIAE  Final      Susceptibility   Streptococcus pneumoniae - MIC*    ERYTHROMYCIN >=8 RESISTANT Resistant     LEVOFLOXACIN 0.5 SENSITIVE Sensitive     VANCOMYCIN 0.5 SENSITIVE Sensitive     PENICILLIN (meningitis) 1 RESISTANT Resistant     PENO - penicillin 1      PENICILLIN (non-meningitis) 1 SENSITIVE Sensitive     PENICILLIN (oral) 1 INTERMEDIATE Intermediate     CEFTRIAXONE (non-meningitis) 1 SENSITIVE Sensitive     CEFTRIAXONE (meningitis) 1 INTERMEDIATE Intermediate     * STREPTOCOCCUS PNEUMONIAE  Blood Culture ID Panel (Reflexed)     Status: Abnormal   Collection Time: 06/16/20  8:32 AM  Result Value Ref Range Status   Enterococcus faecalis NOT DETECTED NOT DETECTED Final   Enterococcus Faecium NOT DETECTED NOT DETECTED Final   Listeria monocytogenes NOT DETECTED NOT DETECTED Final   Staphylococcus species NOT DETECTED NOT DETECTED Final   Staphylococcus aureus (BCID) NOT DETECTED NOT DETECTED Final   Staphylococcus epidermidis NOT DETECTED NOT DETECTED Final   Staphylococcus lugdunensis NOT DETECTED NOT DETECTED Final   Streptococcus species DETECTED (A) NOT DETECTED Final    Comment: CRITICAL RESULT CALLED TO, READ BACK BY AND VERIFIED WITH: DAVID BESANTI  06/16/20 AT 2148 HS    Streptococcus agalactiae NOT DETECTED NOT DETECTED Final   Streptococcus pneumoniae DETECTED (A) NOT DETECTED Final    Comment: CRITICAL RESULT CALLED TO, READ BACK BY AND VERIFIED WITH: DAVID BESANTI 06/16/20 AT 2148 HS    Streptococcus pyogenes NOT DETECTED NOT DETECTED Final   A.calcoaceticus-baumannii NOT DETECTED NOT DETECTED Final   Bacteroides fragilis NOT DETECTED NOT DETECTED Final   Enterobacterales NOT DETECTED NOT DETECTED Final   Enterobacter cloacae complex NOT DETECTED NOT DETECTED Final   Escherichia coli NOT DETECTED NOT DETECTED Final   Klebsiella aerogenes NOT DETECTED NOT DETECTED Final   Klebsiella oxytoca NOT DETECTED NOT DETECTED Final   Klebsiella pneumoniae NOT DETECTED NOT DETECTED Final   Proteus species  NOT DETECTED NOT DETECTED Final   Salmonella species NOT DETECTED NOT DETECTED Final   Serratia marcescens NOT DETECTED NOT DETECTED Final   Haemophilus influenzae NOT DETECTED NOT DETECTED Final   Neisseria meningitidis NOT DETECTED NOT DETECTED Final   Pseudomonas aeruginosa NOT DETECTED NOT DETECTED Final   Stenotrophomonas maltophilia NOT DETECTED NOT DETECTED Final   Candida albicans NOT DETECTED NOT DETECTED Final   Candida auris NOT DETECTED NOT DETECTED Final   Candida glabrata NOT DETECTED NOT DETECTED Final   Candida krusei NOT DETECTED NOT DETECTED Final   Candida parapsilosis NOT DETECTED NOT DETECTED Final   Candida tropicalis NOT DETECTED NOT DETECTED Final   Cryptococcus neoformans/gattii NOT DETECTED NOT DETECTED Final    Comment: Performed at [facility], [address] [place], [place] [address]    Procedures and diagnostic studies:  No results found.             LOS: 4 days   Tarita Deshmukh  [facility]   Pager on [email or web address]. If 7PM-7AM, please contact night-coverage at [email or web address]     06/20/2020, 1:44 PM

## 2020-06-20 NOTE — Progress Notes (Signed)
OT Cancellation Note  Patient Details Name: Daniel Combs MRN: [id number] DOB: September 26, 1991   Cancelled Treatment:    Reason Eval/Treat Not Completed: Fatigue/lethargy limiting ability to participate. Pt states he does not have the energy to do therapy today, following three days of dialysis & migraine headache.  [name], PhD, [place], OTR/L ascom [phone] 06/20/20, 4:53 PM

## 2020-06-21 DIAGNOSIS — B953 Streptococcus pneumoniae as the cause of diseases classified elsewhere: Secondary | ICD-10-CM | POA: Diagnosis present

## 2020-06-21 DIAGNOSIS — R7881 Bacteremia: Secondary | ICD-10-CM | POA: Diagnosis present

## 2020-06-21 NOTE — Progress Notes (Signed)
Patient has been clinically accepted by [facility] MWF 6:30am, however [facility] is still waiting on [employer], since patient has out of state Medicaid, Out of Network will need to be approved before patient can start. Transportation is also still being arranged.

## 2020-06-21 NOTE — Progress Notes (Signed)
PT Cancellation Note  Patient Details Name: Daniel Combs MRN: [id number] DOB: 1991-07-10   Cancelled Treatment:    Reason Eval/Treat Not Completed: Pt's BP remains elevated with Dr. Mal Misty stating ok for limited PT evaluation including getting pt out of bed and to the chair.  Patient declined working with PT this AM, however, secondary to back pain, "I just want to lie here".  Pt education provided on benefits of OOB to chair specifically related to pneumonia and back pain with pt requesting PT to return later this date.  Will attempt to see pt at a future date/time as medically appropriate.     Linus Salmons PT, DPT 06/21/20, 9:49 AM

## 2020-06-21 NOTE — Progress Notes (Addendum)
Progress Note    Daniel Combs  WNI:[id number] DOB: 10-May-1991  DOA: 06/16/2020 PCP: Pcp, No      Brief Narrative:    Medical records reviewed and are as summarized below:  Daniel Combs is a 29 y.o. male       Assessment/Plan:   Principal Problem:   Sepsis ([place]) Active Problems:   CAP (community acquired pneumonia)   Bacteremia due to Streptococcus pneumoniae   Hypertension   ESRD (end stage renal disease) ([place])   Hyponatremia   Hyperkalemia   Elevated troponin   Anemia in ESRD (end-stage renal disease) ([facility]) Strep pneumo bacteremia-strep pneumo antigen positive and strep pneumo in blood HIV infection Immunocompromised state-CD4 count of 74  Body mass index is 21.63 kg/m.    PLAN  Requested physical therapy evaluation.  PT was reluctant to do for evaluation because of elevated blood pressure.  PT decided to do limited evaluation.  Continue IV Rocephin for strep pneumonia and bacteremia  Continue antiretroviral therapy and Bactrim for prophylaxis.  S/p treatment with IV Venofer on 06/19/2020 and 06/20/2020 S/p transfusion with 1 unit of PRBCs on 06/17/2020  Follow-up with nephrologist for hemodialysis  Follow-up with ID for further recommendations  Patient intends to live in [place] for about a month before moving back to the [place].  Arrangements are being made for him to get outpatient hemodialysis port prior to discharge.   Diet Order            Diet renal with fluid restriction Fluid restriction: 1200 mL Fluid; Room service appropriate? Yes; Fluid consistency: Thin  Diet effective now                    Consultants:  Nephrologist  Infectious disease  Procedures:  Placement of tunneled hemodialysis catheter via right IJ vein on 06/18/2020  Hemodialysis    Medications:   . amLODipine  10 mg Oral Daily  . aspirin EC  81 mg Oral Daily  . carvedilol  25 mg Oral BID WC  . Chlorhexidine Gluconate Cloth  6 each  Topical Q0600  . heparin  5,000 Units Subcutaneous Q8H  . hydrALAZINE  50 mg Oral Q8H  . losartan  100 mg Oral Daily  . sulfamethoxazole-trimethoprim  1 tablet Oral Daily   Continuous Infusions: . cefTRIAXone (ROCEPHIN)  IV 2 g (06/21/20 1150)     Anti-infectives (From admission, onward)   Start     Dose/Rate Route Frequency Ordered Stop   06/19/20 2000  sulfamethoxazole-trimethoprim (BACTRIM) 400-80 MG per tablet 1 tablet        1 tablet Oral Daily 06/19/20 1729     06/16/20 1130  cefTRIAXone (ROCEPHIN) 2 g in sodium chloride 0.9 % 100 mL IVPB        2 g 200 mL/hr over 30 Minutes Intravenous Every 24 hours 06/16/20 1123     06/16/20 1130  azithromycin (ZITHROMAX) 500 mg in sodium chloride 0.9 % 250 mL IVPB  Status:  Discontinued        500 mg 250 mL/hr over 60 Minutes Intravenous Every 24 hours 06/16/20 1123 06/18/20 2016             Family Communication/Anticipated D/C date and plan/Code Status   DVT prophylaxis: Place and maintain sequential compression device Start: 06/17/20 0741 heparin injection 5,000 Units Start: 06/16/20 1400     Code Status: Full Code  Family Communication: Plan discussed with patient Disposition Plan:    Status is: Inpatient  Remains inpatient appropriate because:IV treatments appropriate due to intensity of illness or inability to take PO   Dispo: The patient is from: Home              Anticipated d/c is to: Home              Anticipated d/c date is: 1 day              Patient currently is not medically stable to d/c.  Awaiting final recommendations from ID.  Patient has to get an outpatient hemodialysis spot prior to discharge.           Subjective:   Interval events noted.  He feels weak because he has been laying in bed for a long time.  No shortness of breath or chest pain.  Objective:    Vitals:   06/20/20 2017 06/21/20 0535 06/21/20 1137 06/21/20 1340  BP: (!) 131/97 (!) 153/110 (!) 154/117 (!) 161/119  Pulse:  (!) 109 (!) 108 98   Resp: 16 18 16    Temp: 98.7 F (37.1 C) 98.9 F (37.2 C) 98.6 F (37 C)   TempSrc: Oral Oral Oral   SpO2: 95% 98% 96%   Weight:      Height:       No data found.   Intake/Output Summary (Last 24 hours) at 06/21/2020 1444 Last data filed at 06/21/2020 0400 Gross per 24 hour  Intake 516 ml  Output 700 ml  Net -184 ml   Filed Weights   06/16/20 2228  Weight: 60.8 kg    Exam:  GEN: NAD SKIN: No rash EYES: No pallor or icterus ENT: MMM CV: RRR PULM: CTA B ABD: soft, ND, NT, +BS CNS: AAO x 3, non focal EXT: No edema or tenderness    Data Reviewed:   I have personally reviewed following labs and imaging studies:  Labs: Labs show the following:   Basic Metabolic Panel: Recent Labs  Lab 06/16/20 2031 06/16/20 2031 06/17/20 0540 06/17/20 0540 06/18/20 0655 06/18/20 0655 06/19/20 0621 06/20/20 0543  NA 132*  --  130*  --  132*  --  131* 133*  K 5.0   < > 5.4*   < > 4.5   < > 4.3 4.0  CL 102  --  101  --  98  --  98 97*  CO2 16*  --  13*  --  16*  --  16* 21*  GLUCOSE 108*  --  88  --  102*  --  95 108*  BUN 131*  --  145*  --  127*  --  116* 74*  CREATININE 15.38*  --  16.44*  --  16.42*  --  14.31* 10.51*  CALCIUM 7.7*  --  7.7*  --  7.7*  --  7.2* 8.0*   < > = values in this interval not displayed.   GFR Estimated Creatinine Clearance: 8.8 mL/min (A) (by C-G formula based on SCr of 10.51 mg/dL (H)). Liver Function Tests: Recent Labs  Lab 06/16/20 0832  AST 15  ALT 30  ALKPHOS 63  BILITOT 0.7  PROT 8.5*  ALBUMIN 3.2*   No results for input(s): LIPASE, AMYLASE in the last 168 hours. No results for input(s): AMMONIA in the last 168 hours. Coagulation profile No results for input(s): INR, PROTIME in the last 168 hours.  CBC: Recent Labs  Lab 06/16/20 0832 06/16/20 0832 06/17/20 0540 06/17/20 2250 06/18/20 0655 06/19/20 0621 06/20/20 0543  WBC 16.2*  --  10.8*  --  10.3 8.3 9.3  NEUTROABS 14.7*  --   --   --   --    --   --   HGB 7.4*   < > 6.5* 8.2* 9.1* 7.5* 9.3*  HCT 22.3*   < > 19.3* 23.9* 26.0* 22.4* 26.6*  MCV 78.5*  --  78.1*  --  76.5* 79.7* 78.9*  PLT 168  --  149*  --  180 158 220   < > = values in this interval not displayed.   Cardiac Enzymes: No results for input(s): CKTOTAL, CKMB, CKMBINDEX, TROPONINI in the last 168 hours. BNP (last 3 results) No results for input(s): PROBNP in the last 8760 hours. CBG: No results for input(s): GLUCAP in the last 168 hours. D-Dimer: No results for input(s): DDIMER in the last 72 hours. Hgb A1c: No results for input(s): HGBA1C in the last 72 hours. Lipid Profile: No results for input(s): CHOL, HDL, LDLCALC, TRIG, CHOLHDL, LDLDIRECT in the last 72 hours. Thyroid function studies: No results for input(s): TSH, T4TOTAL, T3FREE, THYROIDAB in the last 72 hours.  Invalid input(s): FREET3 Anemia work up: No results for input(s): VITAMINB12, FOLATE, FERRITIN, TIBC, IRON, RETICCTPCT in the last 72 hours. Sepsis Labs: Recent Labs  Lab 06/16/20 0832 06/16/20 0832 06/16/20 1204 06/17/20 0540 06/18/20 0655 06/19/20 0621 06/20/20 0543  PROCALCITON >150.00  --   --   --   --   --   --   WBC 16.2*   < >  --  10.8* 10.3 8.3 9.3  LATICACIDVEN 0.9  --  0.7  --   --   --   --    < > = values in this interval not displayed.    Microbiology Recent Results (from the past 240 hour(s))  SARS Coronavirus 2 by RT PCR (hospital order, performed in [facility] hospital lab) Nasopharyngeal Nasopharyngeal Swab     Status: None   Collection Time: 06/16/20  8:32 AM   Specimen: Nasopharyngeal Swab  Result Value Ref Range Status   SARS Coronavirus 2 NEGATIVE NEGATIVE Final    Comment: (NOTE) SARS-CoV-2 target nucleic acids are NOT DETECTED.  The SARS-CoV-2 RNA is generally detectable in upper and lower respiratory specimens during the acute phase of infection. The lowest concentration of SARS-CoV-2 viral copies this assay can detect is 250 copies / mL. A  negative result does not preclude SARS-CoV-2 infection and should not be used as the sole basis for treatment or other patient management decisions.  A negative result may occur with improper specimen collection / handling, submission of specimen other than nasopharyngeal swab, presence of viral mutation(s) within the areas targeted by this assay, and inadequate number of viral copies (<250 copies / mL). A negative result must be combined with clinical observations, patient history, and epidemiological information.  Fact Sheet for Patients:   StrictlyIdeas.no  Fact Sheet for Healthcare Providers: BankingDealers.co.za  This test is not yet approved or  cleared by the [place] FDA and has been authorized for detection and/or diagnosis of SARS-CoV-2 by FDA under an Emergency Use Authorization (EUA).  This EUA will remain in effect (meaning this test can be used) for the duration of the COVID-19 declaration under Section 564(b)(1) of the Act, 21 U.S.C. section 360bbb-3(b)(1), unless the authorization is terminated or revoked sooner.  Performed at [facility], [address] [place], [place] [address]   Blood Culture (routine x 2)     Status: Abnormal   Collection Time: 06/16/20  8:32 AM   Specimen: BLOOD  Result Value Ref Range Status   Specimen Description   Final    BLOOD LEFT FORE ARM Performed at [facility], [address] [place], Daniel Combs    Special Requests   Final    BOTTLES DRAWN AEROBIC AND ANAEROBIC Blood Culture results may not be optimal due to an inadequate volume of blood received in culture bottles Performed at [facility], [address] [place], [place] [address]    Culture  Setup Time   Final    GRAM POSITIVE COCCI IN BOTH AEROBIC AND ANAEROBIC BOTTLES CRITICAL VALUE NOTED.  VALUE IS CONSISTENT WITH PREVIOUSLY REPORTED AND CALLED VALUE. Performed at [facility], [address] [place], [place] [address]    Culture (A)  Final    STREPTOCOCCUS PNEUMONIAE SUSCEPTIBILITIES PERFORMED ON PREVIOUS CULTURE WITHIN THE LAST 5 DAYS. Performed at [facility], [place] [address], [place], [place] [address]    Report Status 06/19/2020 FINAL  Final  Blood Culture (routine x 2)     Status: Abnormal   Collection Time: 06/16/20  8:32 AM   Specimen: BLOOD  Result Value Ref Range Status   Specimen Description   Final    BLOOD LEFT FORE ARM Performed at [facility], [address], [place], [place] [address]    Special Requests   Final    BOTTLES DRAWN AEROBIC AND ANAEROBIC Blood Culture results may not be optimal due to an inadequate volume of blood received in culture bottles Performed at [facility], [address] [place], [place] [address]    Culture  Setup Time   Final    Organism ID to follow [place] CRITICAL RESULT CALLED TO, READ BACK BY AND VERIFIED WITH: Daniel Combs 06/16/20 AT 2148 HS Performed at [facility], [place] [address] [place], [place] [address]    Culture STREPTOCOCCUS PNEUMONIAE (A)  Final   Report Status 06/19/2020 FINAL  Final   Organism ID, Bacteria STREPTOCOCCUS PNEUMONIAE  Final      Susceptibility   Streptococcus pneumoniae - MIC*    ERYTHROMYCIN >=8 RESISTANT Resistant     LEVOFLOXACIN 0.5 SENSITIVE Sensitive     VANCOMYCIN 0.5 SENSITIVE Sensitive     PENICILLIN (meningitis) 1 RESISTANT Resistant     PENO - penicillin 1      PENICILLIN (non-meningitis) 1 SENSITIVE Sensitive     PENICILLIN (oral) 1 INTERMEDIATE Intermediate     CEFTRIAXONE (non-meningitis) 1 SENSITIVE Sensitive     CEFTRIAXONE (meningitis) 1 INTERMEDIATE Intermediate     * STREPTOCOCCUS PNEUMONIAE  Blood Culture ID Panel (Reflexed)     Status: Abnormal   Collection Time: 06/16/20  8:32 AM  Result Value Ref Range Status   Enterococcus faecalis NOT DETECTED NOT  DETECTED Final   Enterococcus Faecium NOT DETECTED NOT DETECTED Final   Listeria monocytogenes NOT DETECTED NOT DETECTED Final   Staphylococcus species NOT DETECTED NOT DETECTED Final   Staphylococcus aureus (BCID) NOT DETECTED NOT DETECTED Final   Staphylococcus epidermidis NOT DETECTED NOT DETECTED Final   Staphylococcus lugdunensis NOT DETECTED NOT DETECTED Final   Streptococcus species DETECTED (A) NOT DETECTED Final    Comment: CRITICAL RESULT CALLED TO, READ BACK BY AND VERIFIED WITH: Daniel Combs 06/16/20 AT 2148 HS    Streptococcus agalactiae NOT DETECTED NOT DETECTED Final   Streptococcus pneumoniae DETECTED (A) NOT DETECTED Final    Comment: CRITICAL RESULT  CALLED TO, READ BACK BY AND VERIFIED WITH: Daniel Combs 06/16/20 AT 2148 HS    Streptococcus pyogenes NOT DETECTED NOT DETECTED Final   A.calcoaceticus-baumannii NOT DETECTED NOT DETECTED Final   Bacteroides fragilis NOT DETECTED NOT DETECTED Final   Enterobacterales NOT DETECTED NOT DETECTED Final   Enterobacter cloacae complex NOT DETECTED NOT DETECTED Final   Escherichia coli NOT DETECTED NOT DETECTED Final   Klebsiella aerogenes NOT DETECTED NOT DETECTED Final   Klebsiella oxytoca NOT DETECTED NOT DETECTED Final   Klebsiella pneumoniae NOT DETECTED NOT DETECTED Final   Proteus species NOT DETECTED NOT DETECTED Final   Salmonella species NOT DETECTED NOT DETECTED Final   Serratia marcescens NOT DETECTED NOT DETECTED Final   Haemophilus influenzae NOT DETECTED NOT DETECTED Final   Neisseria meningitidis NOT DETECTED NOT DETECTED Final   Pseudomonas aeruginosa NOT DETECTED NOT DETECTED Final   Stenotrophomonas maltophilia NOT DETECTED NOT DETECTED Final   Candida albicans NOT DETECTED NOT DETECTED Final   Candida auris NOT DETECTED NOT DETECTED Final   Candida glabrata NOT DETECTED NOT DETECTED Final   Candida krusei NOT DETECTED NOT DETECTED Final   Candida parapsilosis NOT DETECTED NOT DETECTED Final   Candida  tropicalis NOT DETECTED NOT DETECTED Final   Cryptococcus neoformans/gattii NOT DETECTED NOT DETECTED Final    Comment: Performed at [facility], [address] [place], [place] [address]    Procedures and diagnostic studies:  No results found.             LOS: 5 days   Laveta Gilkey  [facility]   Pager on [email or web address]. If 7PM-7AM, please contact night-coverage at [email or web address]     06/21/2020, 2:44 PM

## 2020-06-21 NOTE — Evaluation (Signed)
Physical Therapy Evaluation Patient Details Name: Daniel Combs MRN: [id number] DOB: 02/23/1991 Today's Date: 06/21/2020   History of Present Illness  Pt is a 29 y.o. male who presented to ED with shortness breath, cough, R-sided back pain, and chest pain X 1 week. PMHx includes HTN, tobacco abuse, ESRD, now s/p IJ permcath and on dialysis.    Clinical Impression  Pt with elevated resting BP with Dr. Mal Misty stating ok for limited PT evaluation including bed to chair.  Pt demonstrated very good functional strength during sup to/from sit and sit to/from stand with no deficits noted.  Pt steady upon standing and reported no adverse symptoms other than back pain during the session.  Pt's BP in supine 154/117 and then measured again in sitting at 161/119.  Pt requested no further PT services going forward.  Will complete PT orders at this time but will reassess pt pending a change in status upon receipt of new PT orders.     Follow Up Recommendations No PT follow up    Equipment Recommendations  None recommended by PT    Recommendations for Other Services       Precautions / Restrictions Precautions Precautions: Fall Restrictions Weight Bearing Restrictions: No      Mobility  Bed Mobility Overal bed mobility: Independent             General bed mobility comments: Pt able to go from sup to/from sit with very good speed and effort  Transfers Overall transfer level: Independent               General transfer comment: Good eccentric and concentric control and stability  Ambulation/Gait             General Gait Details: NT secondary to elevated BP  Stairs            Wheelchair Mobility    Modified Rankin (Stroke Patients Only)       Balance Overall balance assessment: No apparent balance deficits (not formally assessed)                                           Pertinent Vitals/Pain Pain Assessment: 0-10 Pain Score: 5  Pain  Location: back Pain Descriptors / Indicators: Sore Pain Intervention(s): Premedicated before session;Monitored during session    Home Living Family/patient expects to be discharged to:: Private residence Living Arrangements: Alone   Type of Home: Apartment Home Access: Stairs to enter   [employer] of Steps: apt is on second floor of non-elevator building Home Layout: One level   Additional Comments: Pt lives in an apt in [place]. He is currently in [place] visiting his mother. Pt himself has no adaptive equipment/DME  Equipment is available at his mother's house, however: tub with seat and grab bar, RW, rollator.    Prior Function Level of Independence: Independent               Hand Dominance        Extremity/Trunk Assessment   Upper Extremity Assessment Upper Extremity Assessment: Overall WFL for tasks assessed    Lower Extremity Assessment Lower Extremity Assessment: Overall WFL for tasks assessed       Communication   Communication: No difficulties  Cognition Arousal/Alertness: Awake/alert Behavior During Therapy: WFL for tasks assessed/performed Overall Cognitive Status: Within Functional Limits for tasks assessed  General Comments      Exercises     Assessment/Plan    PT Assessment Patent does not need any further PT services  PT Problem List         PT Treatment Interventions      PT Goals (Current goals can be found in the Care Plan section)  Acute Rehab PT Goals PT Goal Formulation: All assessment and education complete, DC therapy    Frequency     Barriers to discharge        Co-evaluation               AM-PAC PT "6 Clicks" Mobility  Outcome Measure Help needed turning from your back to your side while in a flat bed without using bedrails?: None Help needed moving from lying on your back to sitting on the side of a flat bed without using bedrails?: None Help needed  moving to and from a bed to a chair (including a wheelchair)?: None Help needed standing up from a chair using your arms (e.g., wheelchair or bedside chair)?: None Help needed to walk in hospital room?: None Help needed climbing 3-5 steps with a railing? : None 6 Click Score: 24    End of Session   Activity Tolerance: Patient tolerated treatment well Patient left: in bed;with call bell/phone within reach Nurse Communication: Mobility status;Other (comment) (Pt's BP in sitting per above) PT Visit Diagnosis: Muscle weakness (generalized) (M62.81)    Time: 6269-4854 PT Time Calculation (min) (ACUTE ONLY): 16 min   Charges:   PT Evaluation $PT Eval Low Complexity: 1 Low          D. Royetta Asal PT, DPT 06/21/20, 2:11 PM

## 2020-06-21 NOTE — Progress Notes (Signed)
Patient refused all medications. States that he vomited after morning medication administration. Antiemetic offered and declined.

## 2020-06-21 NOTE — Progress Notes (Signed)
[facility]  ROUNDING NOTE   Subjective:   Patient is a 29 y.o. male with a PMHx of longstanding hypertension, ESRD not yet on dialysis,has significant azotemia with a BUN of 127 and creatinine of 16.42.   Update: Pt has had 3 dialysis treatments.  No chest pain this AM.   Objective:  Vital signs in last 24 hours:  Temp:  [98.4 F (36.9 C)-98.9 F (37.2 C)] 98.9 F (37.2 C) (09/16 0535) Pulse Rate:  [76-123] 108 (09/16 0535) Resp:  [0-23] 18 (09/16 0535) BP: (111-168)/(72-136) 153/110 (09/16 0535) SpO2:  [95 %-100 %] 98 % (09/16 0535)  Weight change:  Filed Weights   06/16/20 2228  Weight: 60.8 kg    Intake/Output: I/O last 3 completed shifts: In: 035 [P.O.:240; IV Piggyback:516] Out: 2800 [Urine:1700; Other:1100]   Intake/Output this shift:  No intake/output data recorded.  Physical Exam: General:  No acute distress  Head:  Oral mucous membranes moist  Eyes:  Sclerae and conjunctivae clear  Neck:  Supple  Lungs:   Clear bilateral  Heart:  Regular but tachycardic.  Abdomen:   Soft, nontender, non distended  Extremities:  No peripheral edema.  Neurologic:  Awake, alert, oriented x3   Skin:  No acute lesions or rashes  Access:  R IJ permcath placed 0/09/38    Basic Metabolic Panel: Recent Labs  Lab 06/16/20 2031 06/16/20 2031 06/17/20 0540 06/17/20 0540 06/18/20 0655 06/19/20 0621 06/20/20 0543  NA 132*  --  130*  --  132* 131* 133*  K 5.0  --  5.4*  --  4.5 4.3 4.0  CL 102  --  101  --  98 98 97*  CO2 16*  --  13*  --  16* 16* 21*  GLUCOSE 108*  --  88  --  102* 95 108*  BUN 131*  --  145*  --  127* 116* 74*  CREATININE 15.38*  --  16.44*  --  16.42* 14.31* 10.51*  CALCIUM 7.7*   < > 7.7*   < > 7.7* 7.2* 8.0*   < > = values in this interval not displayed.    Liver Function Tests: Recent Labs  Lab 06/16/20 0832  AST 15  ALT 30  ALKPHOS 63  BILITOT 0.7  PROT 8.5*  ALBUMIN 3.2*   No results for input(s): LIPASE, AMYLASE in the  last 168 hours. No results for input(s): AMMONIA in the last 168 hours.  CBC: Recent Labs  Lab 06/16/20 0832 06/16/20 0832 06/17/20 0540 06/17/20 2250 06/18/20 0655 06/19/20 0621 06/20/20 0543  WBC 16.2*  --  10.8*  --  10.3 8.3 9.3  NEUTROABS 14.7*  --   --   --   --   --   --   HGB 7.4*   < > 6.5* 8.2* 9.1* 7.5* 9.3*  HCT 22.3*   < > 19.3* 23.9* 26.0* 22.4* 26.6*  MCV 78.5*  --  78.1*  --  76.5* 79.7* 78.9*  PLT 168  --  149*  --  180 158 220   < > = values in this interval not displayed.    Cardiac Enzymes: No results for input(s): CKTOTAL, CKMB, CKMBINDEX, TROPONINI in the last 168 hours.  BNP: Invalid input(s): POCBNP  CBG: No results for input(s): GLUCAP in the last 168 hours.  Microbiology: Results for orders placed or performed during the hospital encounter of 06/16/20  SARS Coronavirus 2 by RT PCR (hospital order, performed in [facility] hospital lab) Nasopharyngeal  Nasopharyngeal Swab     Status: None   Collection Time: 06/16/20  8:32 AM   Specimen: Nasopharyngeal Swab  Result Value Ref Range Status   SARS Coronavirus 2 NEGATIVE NEGATIVE Final    Comment: (NOTE) SARS-CoV-2 target nucleic acids are NOT DETECTED.  The SARS-CoV-2 RNA is generally detectable in upper and lower respiratory specimens during the acute phase of infection. The lowest concentration of SARS-CoV-2 viral copies this assay can detect is 250 copies / mL. A negative result does not preclude SARS-CoV-2 infection and should not be used as the sole basis for treatment or other patient management decisions.  A negative result may occur with improper specimen collection / handling, submission of specimen other than nasopharyngeal swab, presence of viral mutation(s) within the areas targeted by this assay, and inadequate number of viral copies (<250 copies / mL). A negative result must be combined with clinical observations, patient history, and epidemiological information.  Fact Sheet for  Patients:   StrictlyIdeas.no  Fact Sheet for Healthcare Providers: BankingDealers.co.za  This test is not yet approved or  cleared by the [place] FDA and has been authorized for detection and/or diagnosis of SARS-CoV-2 by FDA under an Emergency Use Authorization (EUA).  This EUA will remain in effect (meaning this test can be used) for the duration of the COVID-19 declaration under Section 564(b)(1) of the Act, 21 U.S.C. section 360bbb-3(b)(1), unless the authorization is terminated or revoked sooner.  Performed at [facility], [address], [place], [place] [address]   Blood Culture (routine x 2)     Status: Abnormal   Collection Time: 06/16/20  8:32 AM   Specimen: BLOOD  Result Value Ref Range Status   Specimen Description   Final    BLOOD LEFT FORE ARM Performed at [facility], [address] [place], [place] [address]    Special Requests   Final    BOTTLES DRAWN AEROBIC AND ANAEROBIC Blood Culture results may not be optimal due to an inadequate volume of blood received in culture bottles Performed at [facility], [address], [place], [place] [address]    Culture  Setup Time   Final    GRAM POSITIVE COCCI IN BOTH AEROBIC AND ANAEROBIC BOTTLES CRITICAL VALUE NOTED.  VALUE IS CONSISTENT WITH PREVIOUSLY REPORTED AND CALLED VALUE. Performed at [facility], [address] [place], [place] [address]    Culture (A)  Final    STREPTOCOCCUS PNEUMONIAE SUSCEPTIBILITIES PERFORMED ON PREVIOUS CULTURE WITHIN THE LAST 5 DAYS. Performed at [facility], [place] [address] [place], [place] [address]    Report Status 06/19/2020 FINAL  Final  Blood Culture (routine x 2)     Status: Abnormal   Collection Time: 06/16/20  8:32 AM   Specimen: BLOOD  Result Value Ref Range Status   Specimen Description   Final    BLOOD LEFT FORE ARM Performed at [facility], [address], [place], [place] [address]    Special Requests   Final    BOTTLES DRAWN AEROBIC AND ANAEROBIC Blood Culture results may not be optimal due to an inadequate volume of blood received in culture bottles Performed at [facility], [address] [place], [place] [address]    Culture  Setup Time   Final    Organism ID to follow [place] CRITICAL RESULT CALLED TO, READ BACK BY AND VERIFIED WITH: DAVID BESANTI 06/16/20 AT 2148 HS Performed  at [facility], [place] [address], [place], [place] [address]    Culture STREPTOCOCCUS PNEUMONIAE (A)  Final   Report Status 06/19/2020 FINAL  Final   Organism ID, Bacteria STREPTOCOCCUS PNEUMONIAE  Final      Susceptibility   Streptococcus pneumoniae - MIC*    ERYTHROMYCIN >=8 RESISTANT Resistant     LEVOFLOXACIN 0.5 SENSITIVE Sensitive     VANCOMYCIN 0.5 SENSITIVE Sensitive     PENICILLIN (meningitis) 1 RESISTANT Resistant     PENO - penicillin 1      PENICILLIN (non-meningitis) 1 SENSITIVE Sensitive     PENICILLIN (oral) 1 INTERMEDIATE Intermediate     CEFTRIAXONE (non-meningitis) 1 SENSITIVE Sensitive     CEFTRIAXONE (meningitis) 1 INTERMEDIATE Intermediate     * STREPTOCOCCUS PNEUMONIAE  Blood Culture ID Panel (Reflexed)     Status: Abnormal   Collection Time: 06/16/20  8:32 AM  Result Value Ref Range Status   Enterococcus faecalis NOT DETECTED NOT DETECTED Final   Enterococcus Faecium NOT DETECTED NOT DETECTED Final   Listeria monocytogenes NOT DETECTED NOT DETECTED Final   Staphylococcus species NOT DETECTED NOT DETECTED Final   Staphylococcus aureus (BCID) NOT DETECTED NOT DETECTED Final   Staphylococcus epidermidis NOT DETECTED NOT DETECTED Final   Staphylococcus lugdunensis NOT DETECTED NOT DETECTED Final   Streptococcus species DETECTED (A) NOT DETECTED Final    Comment: CRITICAL RESULT CALLED TO, READ BACK BY AND VERIFIED WITH: DAVID BESANTI 06/16/20 AT 2148 HS     Streptococcus agalactiae NOT DETECTED NOT DETECTED Final   Streptococcus pneumoniae DETECTED (A) NOT DETECTED Final    Comment: CRITICAL RESULT CALLED TO, READ BACK BY AND VERIFIED WITH: DAVID BESANTI 06/16/20 AT 2148 HS    Streptococcus pyogenes NOT DETECTED NOT DETECTED Final   A.calcoaceticus-baumannii NOT DETECTED NOT DETECTED Final   Bacteroides fragilis NOT DETECTED NOT DETECTED Final   Enterobacterales NOT DETECTED NOT DETECTED Final   Enterobacter cloacae complex NOT DETECTED NOT DETECTED Final   Escherichia coli NOT DETECTED NOT DETECTED Final   Klebsiella aerogenes NOT DETECTED NOT DETECTED Final   Klebsiella oxytoca NOT DETECTED NOT DETECTED Final   Klebsiella pneumoniae NOT DETECTED NOT DETECTED Final   Proteus species NOT DETECTED NOT DETECTED Final   Salmonella species NOT DETECTED NOT DETECTED Final   Serratia marcescens NOT DETECTED NOT DETECTED Final   Haemophilus influenzae NOT DETECTED NOT DETECTED Final   Neisseria meningitidis NOT DETECTED NOT DETECTED Final   Pseudomonas aeruginosa NOT DETECTED NOT DETECTED Final   Stenotrophomonas maltophilia NOT DETECTED NOT DETECTED Final   Candida albicans NOT DETECTED NOT DETECTED Final   Candida auris NOT DETECTED NOT DETECTED Final   Candida glabrata NOT DETECTED NOT DETECTED Final   Candida krusei NOT DETECTED NOT DETECTED Final   Candida parapsilosis NOT DETECTED NOT DETECTED Final   Candida tropicalis NOT DETECTED NOT DETECTED Final   Cryptococcus neoformans/gattii NOT DETECTED NOT DETECTED Final    Comment: Performed at [facility], [address] [place], [place] [address]    Coagulation Studies: No results for input(s): LABPROT, INR in the last 72 hours.  Urinalysis: No results for input(s): COLORURINE, LABSPEC, PHURINE, GLUCOSEU, HGBUR, BILIRUBINUR, KETONESUR, PROTEINUR, UROBILINOGEN, NITRITE, LEUKOCYTESUR in the last 72 hours.  Invalid input(s): APPERANCEUR    Imaging: No results  found.   Medications:   . cefTRIAXone (ROCEPHIN)  IV 2 g (06/20/20 1130)   . sodium chloride   Intravenous Once  . amLODipine  10 mg Oral Daily  . aspirin  EC  81 mg Oral Daily  . carvedilol  25 mg Oral BID WC  . Chlorhexidine Gluconate Cloth  6 each Topical Q0600  . heparin  5,000 Units Subcutaneous Q8H  . hydrALAZINE  50 mg Oral Q8H  . losartan  100 mg Oral Daily  . sulfamethoxazole-trimethoprim  1 tablet Oral Daily   acetaminophen, chlorpheniramine-HYDROcodone, hydrALAZINE, HYDROmorphone (DILAUDID) injection, labetalol, morphine injection, ondansetron (ZOFRAN) IV, ondansetron (ZOFRAN) IV, oxyCODONE-acetaminophen, promethazine  Assessment/ Plan:  29 y.o. male  with a PMHx of longstanding hypertension, ESRD not yet on dialysis, anemia of chronic kidney disease, secondary hyperparathyroidism, who was admitted to [facility] on 06/16/2020 for evaluation of shortness of breath and chest pain.   1.  ESRD not yet on dialysis.   Patient was last seen by nephrologist in [place] approximately 1 year ago.  He was told at that time that he needed renal placement therapy but does not yet have dialysis access given delays in care secondary to the pandemic.   - Has had 3 dialysis treatments, will plan for 4th treatment tomorrow.    2.  Hypertension.   Continue amlodipine, coreg, hydralazine, losartan.    3.  Anemia of chronic kidney disease.  Hemoglobin now up to 9.3.  Avoid further blood transfusions as possible.  4.  Hyperkalemia.  Potassium currently 4.0 and target.     LOS: 5 Kamaiya Antilla 9/16/202111:06 AM

## 2020-06-22 LAB — PHOSPHORUS: Phosphorus: 8 mg/dL — ABNORMAL HIGH (ref 2.5–4.6)

## 2020-06-22 LAB — HEMOGLOBIN AND HEMATOCRIT, BLOOD
HCT: 20.4 % — ABNORMAL LOW (ref 39.0–52.0)
Hemoglobin: 6.5 g/dL — ABNORMAL LOW (ref 13.0–17.0)

## 2020-06-22 MED ORDER — SODIUM CHLORIDE 0.9% IV SOLUTION: Freq: Once | INTRAVENOUS | Status: DC

## 2020-06-22 MED ORDER — GUAIFENESIN-CODEINE 100-10 MG/5ML PO SOLN
10.0000 mL | ORAL | Status: AC | PRN
  Administered 2020-06-22 (×2): 10 mL via ORAL
  Filled 2020-06-22 (×2): qty 10

## 2020-06-22 NOTE — Progress Notes (Signed)
[facility]  ROUNDING NOTE   Subjective:   Patient underwent dialysis treatment today. Renal parameters have significantly improved with initiation of dialysis. Still has elevated blood pressure though improved from admission.  Objective:  Vital signs in last 24 hours:  Temp:  [98.2 F (36.8 C)-98.6 F (37 C)] 98.6 F (37 C) (09/17 1348) Pulse Rate:  [110-120] 117 (09/17 1348) Resp:  [14-24] 14 (09/17 1348) BP: (127-164)/(88-105) 127/96 (09/17 1348) SpO2:  [96 %-98 %] 97 % (09/17 1348)  Weight change:  Filed Weights   06/16/20 2228  Weight: 60.8 kg    Intake/Output: I/O last 3 completed shifts: In: 616 [IV Piggyback:616] Out: 400 [Urine:400]   Intake/Output this shift:  Total I/O In: -  Out: 1534 [Urine:175; Other:1359]  Physical Exam: General:  No acute distress  Head:  Oral mucous membranes moist  Eyes:  Sclerae and conjunctivae clear  Neck:  Supple  Lungs:   Clear bilateral, normal effort  Heart:  Regular but tachycardic.  Abdomen:   Soft, nontender, non distended  Extremities:  No peripheral edema.  Neurologic:  Awake, alert, oriented x3   Skin:  No acute lesions or rashes  Access:  R IJ permcath placed 9/32/67    Basic Metabolic Panel: Recent Labs  Lab 06/16/20 2031 06/16/20 2031 06/17/20 0540 06/17/20 0540 06/18/20 0655 06/19/20 0621 06/20/20 0543  NA 132*  --  130*  --  132* 131* 133*  K 5.0  --  5.4*  --  4.5 4.3 4.0  CL 102  --  101  --  98 98 97*  CO2 16*  --  13*  --  16* 16* 21*  GLUCOSE 108*  --  88  --  102* 95 108*  BUN 131*  --  145*  --  127* 116* 74*  CREATININE 15.38*  --  16.44*  --  16.42* 14.31* 10.51*  CALCIUM 7.7*   < > 7.7*   < > 7.7* 7.2* 8.0*   < > = values in this interval not displayed.    Liver Function Tests: Recent Labs  Lab 06/16/20 0832  AST 15  ALT 30  ALKPHOS 63  BILITOT 0.7  PROT 8.5*  ALBUMIN 3.2*   No results for input(s): LIPASE, AMYLASE in the last 168 hours. No results for  input(s): AMMONIA in the last 168 hours.  CBC: Recent Labs  Lab 06/16/20 0832 06/16/20 0832 06/17/20 0540 06/17/20 2250 06/18/20 0655 06/19/20 0621 06/20/20 0543  WBC 16.2*  --  10.8*  --  10.3 8.3 9.3  NEUTROABS 14.7*  --   --   --   --   --   --   HGB 7.4*   < > 6.5* 8.2* 9.1* 7.5* 9.3*  HCT 22.3*   < > 19.3* 23.9* 26.0* 22.4* 26.6*  MCV 78.5*  --  78.1*  --  76.5* 79.7* 78.9*  PLT 168  --  149*  --  180 158 220   < > = values in this interval not displayed.    Cardiac Enzymes: No results for input(s): CKTOTAL, CKMB, CKMBINDEX, TROPONINI in the last 168 hours.  BNP: Invalid input(s): POCBNP  CBG: No results for input(s): GLUCAP in the last 168 hours.  Microbiology: Results for orders placed or performed during the hospital encounter of 06/16/20  SARS Coronavirus 2 by RT PCR (hospital order, performed in [facility] hospital lab) Nasopharyngeal Nasopharyngeal Swab     Status: None   Collection Time: 06/16/20  8:32 AM  Specimen: Nasopharyngeal Swab  Result Value Ref Range Status   SARS Coronavirus 2 NEGATIVE NEGATIVE Final    Comment: (NOTE) SARS-CoV-2 target nucleic acids are NOT DETECTED.  The SARS-CoV-2 RNA is generally detectable in upper and lower respiratory specimens during the acute phase of infection. The lowest concentration of SARS-CoV-2 viral copies this assay can detect is 250 copies / mL. A negative result does not preclude SARS-CoV-2 infection and should not be used as the sole basis for treatment or other patient management decisions.  A negative result may occur with improper specimen collection / handling, submission of specimen other than nasopharyngeal swab, presence of viral mutation(s) within the areas targeted by this assay, and inadequate number of viral copies (<250 copies / mL). A negative result must be combined with clinical observations, patient history, and epidemiological information.  Fact Sheet for Patients:    StrictlyIdeas.no  Fact Sheet for Healthcare Providers: BankingDealers.co.za  This test is not yet approved or  cleared by the [place] FDA and has been authorized for detection and/or diagnosis of SARS-CoV-2 by FDA under an Emergency Use Authorization (EUA).  This EUA will remain in effect (meaning this test can be used) for the duration of the COVID-19 declaration under Section 564(b)(1) of the Act, 21 U.S.C. section 360bbb-3(b)(1), unless the authorization is terminated or revoked sooner.  Performed at [facility], [address] [place], [place] [address]   Blood Culture (routine x 2)     Status: Abnormal   Collection Time: 06/16/20  8:32 AM   Specimen: BLOOD  Result Value Ref Range Status   Specimen Description   Final    BLOOD LEFT FORE ARM Performed at [facility], [address] [place], [place] [address]    Special Requests   Final    BOTTLES DRAWN AEROBIC AND ANAEROBIC Blood Culture results may not be optimal due to an inadequate volume of blood received in culture bottles Performed at [facility], [address], [place], [place] [address]    Culture  Setup Time   Final    GRAM POSITIVE COCCI IN BOTH AEROBIC AND ANAEROBIC BOTTLES CRITICAL VALUE NOTED.  VALUE IS CONSISTENT WITH PREVIOUSLY REPORTED AND CALLED VALUE. Performed at [facility], [address] [place], [place] [address]    Culture (A)  Final    STREPTOCOCCUS PNEUMONIAE SUSCEPTIBILITIES PERFORMED ON PREVIOUS CULTURE WITHIN THE LAST 5 DAYS. Performed at [facility], [place] [address], [place], [place] [address]    Report Status 06/19/2020 FINAL  Final  Blood Culture (routine x 2)     Status: Abnormal   Collection Time: 06/16/20  8:32 AM   Specimen: BLOOD  Result Value Ref Range Status   Specimen Description   Final    BLOOD LEFT FORE ARM Performed at [facility], [address]  [place], [place] [address]    Special Requests   Final    BOTTLES DRAWN AEROBIC AND ANAEROBIC Blood Culture results may not be optimal due to an inadequate volume of blood received in culture bottles Performed at [facility], [address] [place],  [address]    Culture  Setup Time   Final    Organism ID to follow [place] CRITICAL RESULT CALLED TO, READ BACK BY AND VERIFIED WITH: [place] 06/16/20 AT 2148 HS Performed at [facility], [place] [address] [place],  [address]    Culture STREPTOCOCCUS PNEUMONIAE (  A)  Final   Report Status 06/19/2020 FINAL  Final   Organism ID, Bacteria STREPTOCOCCUS PNEUMONIAE  Final      Susceptibility   Streptococcus pneumoniae - MIC*    ERYTHROMYCIN >=8 RESISTANT Resistant     LEVOFLOXACIN 0.5 SENSITIVE Sensitive     VANCOMYCIN 0.5 SENSITIVE Sensitive     PENICILLIN (meningitis) 1 RESISTANT Resistant     PENO - penicillin 1      PENICILLIN (non-meningitis) 1 SENSITIVE Sensitive     PENICILLIN (oral) 1 INTERMEDIATE Intermediate     CEFTRIAXONE (non-meningitis) 1 SENSITIVE Sensitive     CEFTRIAXONE (meningitis) 1 INTERMEDIATE Intermediate     * STREPTOCOCCUS PNEUMONIAE  Blood Culture ID Panel (Reflexed)     Status: Abnormal   Collection Time: 06/16/20  8:32 AM  Result Value Ref Range Status   Enterococcus faecalis NOT DETECTED NOT DETECTED Final   Enterococcus Faecium NOT DETECTED NOT DETECTED Final   Listeria monocytogenes NOT DETECTED NOT DETECTED Final   Staphylococcus species NOT DETECTED NOT DETECTED Final   Staphylococcus aureus (BCID) NOT DETECTED NOT DETECTED Final   Staphylococcus epidermidis NOT DETECTED NOT DETECTED Final   Staphylococcus lugdunensis NOT DETECTED NOT DETECTED Final   Streptococcus species DETECTED (A) NOT DETECTED Final    Comment: CRITICAL RESULT CALLED TO, READ BACK BY AND VERIFIED WITH: DAVID BESANTI 06/16/20 AT 2148 HS    Streptococcus  agalactiae NOT DETECTED NOT DETECTED Final   Streptococcus pneumoniae DETECTED (A) NOT DETECTED Final    Comment: CRITICAL RESULT CALLED TO, READ BACK BY AND VERIFIED WITH: DAVID BESANTI 06/16/20 AT 2148 HS    Streptococcus pyogenes NOT DETECTED NOT DETECTED Final   A.calcoaceticus-baumannii NOT DETECTED NOT DETECTED Final   Bacteroides fragilis NOT DETECTED NOT DETECTED Final   Enterobacterales NOT DETECTED NOT DETECTED Final   Enterobacter cloacae complex NOT DETECTED NOT DETECTED Final   Escherichia coli NOT DETECTED NOT DETECTED Final   Klebsiella aerogenes NOT DETECTED NOT DETECTED Final   Klebsiella oxytoca NOT DETECTED NOT DETECTED Final   Klebsiella pneumoniae NOT DETECTED NOT DETECTED Final   Proteus species NOT DETECTED NOT DETECTED Final   Salmonella species NOT DETECTED NOT DETECTED Final   Serratia marcescens NOT DETECTED NOT DETECTED Final   Haemophilus influenzae NOT DETECTED NOT DETECTED Final   Neisseria meningitidis NOT DETECTED NOT DETECTED Final   Pseudomonas aeruginosa NOT DETECTED NOT DETECTED Final   Stenotrophomonas maltophilia NOT DETECTED NOT DETECTED Final   Candida albicans NOT DETECTED NOT DETECTED Final   Candida auris NOT DETECTED NOT DETECTED Final   Candida glabrata NOT DETECTED NOT DETECTED Final   Candida krusei NOT DETECTED NOT DETECTED Final   Candida parapsilosis NOT DETECTED NOT DETECTED Final   Candida tropicalis NOT DETECTED NOT DETECTED Final   Cryptococcus neoformans/gattii NOT DETECTED NOT DETECTED Final    Comment: Performed at [facility], [address] [place], [place] [address]    Coagulation Studies: No results for input(s): LABPROT, INR in the last 72 hours.  Urinalysis: No results for input(s): COLORURINE, LABSPEC, PHURINE, GLUCOSEU, HGBUR, BILIRUBINUR, KETONESUR, PROTEINUR, UROBILINOGEN, NITRITE, LEUKOCYTESUR in the last 72 hours.  Invalid input(s): APPERANCEUR    Imaging: No results found.   Medications:    . cefTRIAXone (ROCEPHIN)  IV 2 g (06/22/20 1417)   . amLODipine  10 mg Oral Daily  . aspirin EC  81 mg Oral Daily  . carvedilol  25 mg Oral BID WC  . Chlorhexidine Gluconate Cloth  6 each Topical Q0600  .  heparin  5,000 Units Subcutaneous Q8H  . hydrALAZINE  50 mg Oral Q8H  . losartan  100 mg Oral Daily  . sulfamethoxazole-trimethoprim  1 tablet Oral Daily   acetaminophen, chlorpheniramine-HYDROcodone, hydrALAZINE, HYDROmorphone (DILAUDID) injection, labetalol, morphine injection, ondansetron (ZOFRAN) IV, oxyCODONE-acetaminophen, promethazine  Assessment/ Plan:  29 y.o. male  with a PMHx of longstanding hypertension, ESRD not yet on dialysis, anemia of chronic kidney disease, secondary hyperparathyroidism, HIV, who was admitted to [facility] on 06/16/2020 for evaluation of shortness of breath and chest pain.   1.  ESRD initiated on dialysis this admission Patient was last seen by nephrologist in [place] approximately 1 year ago.  He was told at that time that he needed renal placement therapy but does not yet have dialysis access given delays in care secondary to the pandemic.    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  Kidney is atrophic on renal ultrasound. -Patient completed for dialysis treatment today.  Tolerating treatments well.  Outpatient hemodialysis center placement at [place].  2.  Hypertension.   Blood pressure overall improved as compared to admission.  Blood pressure down to 127/96.  Continue amlodipine, carvedilol, hydralazine, and losartan.  3.  Anemia of chronic kidney disease.  Hemoglobin now up to 9.3.  Avoid further blood transfusions as possible.  4.  Hyperkalemia.  Potassium normalized with dialysis treatments.  LOS: 6 Malkie Wille 9/17/20212:32 PM

## 2020-06-22 NOTE — Progress Notes (Signed)
OT Cancellation Note  Patient Details Name: Daniel Combs MRN: [id number] DOB: 1990-10-11   Cancelled Treatment:    Reason Eval/Treat Not Completed: Other (comment). Pt seated in recliner chair upon entering the room and reports no longer needed OT intervention and feeling close to baseline other than fatigue. Transport arrived to take pt to dialysis and OT observed pt ambulating in room and transfer back into bed without use of AD and independently. OT will SIGN OFF and complete order at this time as it appears pt no longer needs acute OT intervention. No follow up services needed. Please reconsult if pt's need changes.  [name], MS, OTR/L , CBIS ascom [phone]  06/22/20, 11:15 AM  06/22/2020, 11:13 AM

## 2020-06-22 NOTE — Progress Notes (Signed)
ID Pt feeling okay Some oozing from dialysis cath site BP 125/82   Pulse (!) 112   Temp 98.6 F (37 C) (Oral)   Resp 14   Ht 5\' 6"  (1.676 m)   Wt 60.8 kg   SpO2 97%   BMI 21.63 kg/m    O/e awake and alert Chest b/l air entry Rt subclavian dialysis cath- oozing managed by compression dressing by his nurse  Hs s1s2  labs . CBC Latest Ref Rng & Units 06/20/2020 06/19/2020 06/18/2020  WBC 4.0 - 10.5 K/uL 9.3 8.3 10.3  Hemoglobin 13.0 - 17.0 g/dL 9.3(L) 7.5(L) 9.1(L)  Hematocrit 39 - 52 % 26.6(L) 22.4(L) 26.0(L)  Platelets 150 - 400 K/uL 220 158 180    CMP Latest Ref Rng & Units 06/20/2020 06/19/2020 06/18/2020  Glucose 70 - 99 mg/dL 108(H) 95 102(H)  BUN 6 - 20 mg/dL 74(H) 116(H) 127(H)  Creatinine 0.61 - 1.24 mg/dL 10.51(H) 14.31(H) 16.42(H)  Sodium 135 - 145 mmol/L 133(L) 131(L) 132(L)  Potassium 3.5 - 5.1 mmol/L 4.0 4.3 4.5  Chloride 98 - 111 mmol/L 97(L) 98 98  CO2 22 - 32 mmol/L 21(L) 16(L) 16(L)  Calcium 8.9 - 10.3 mg/dL 8.0(L) 7.2(L) 7.7(L)  Total Protein 6.5 - 8.1 g/dL - - -  Total Bilirubin 0.3 - 1.2 mg/dL - - -  Alkaline Phos 38 - 126 U/L - - -  AST 15 - 41 U/L - - -  ALT 0 - 44 U/L - - -    Impression/recommendation Strep pneumo bacteremia with left lung pneumonia On ceftriaxone  HIV/AIDS- non compliant- Cd4 is 74 ( 12%) and Vl [id number] genosure prime sent Discussed with patient regarding new HAARt option- HE prefers Biktarvy but symtuza currently may be a better option as we dont know the resistance mutations as he was non compliant Currently on bactrim for PCP prophylaxis  ESRD started dialysis  Accelerated HTN- management as per primary team  Discussed the management with the patient ID will follow him peripherally this weekend- call if needed

## 2020-06-22 NOTE — Progress Notes (Signed)
Progress Note    Daniel Combs  RJJ:[id number] DOB: December 28, 1990  DOA: 06/16/2020 PCP: Pcp, No      Brief Narrative:    Medical records reviewed and are as summarized below:  Daniel Combs is a 29 y.o. male       Assessment/Plan:   Principal Problem:   Sepsis ([place]) Active Problems:   CAP (community acquired pneumonia)   Bacteremia due to Streptococcus pneumoniae   Hypertension   ESRD (end stage renal disease) ([place])   Hyponatremia   Hyperkalemia   Elevated troponin   Anemia in ESRD (end-stage renal disease) ([facility]) Strep pneumo bacteremia-strep pneumo antigen positive and strep pneumo in blood HIV infection Immunocompromised state-CD4 count of 74  Body mass index is 21.63 kg/m.    PLAN  Continue IV Rocephin for strep pneumo and bacteremia  Continue Bactrim for toxoplasma and PCP prophylaxis  S/p treatment with IV Venofer on 06/19/2020 and 06/20/2020 S/p transfusion with 1 unit of PRBCs on 06/17/2020  Follow-up with nephrologist for hemodialysis  Follow-up with ID for further recommendations  Follow-up with case manager to assist with obtaining outpatient hemodialysis spot prior to discharge.   Diet Order            Diet renal/carb modified with fluid restriction Diet-HS Snack? Nothing; Fluid restriction: 1200 mL Fluid; Room service appropriate? Yes; Fluid consistency: Thin  Diet effective now                    Consultants:  Nephrologist  Infectious disease  Procedures:  Placement of tunneled hemodialysis catheter via right IJ vein on 06/18/2020  Hemodialysis    Medications:   . amLODipine  10 mg Oral Daily  . aspirin EC  81 mg Oral Daily  . carvedilol  25 mg Oral BID WC  . Chlorhexidine Gluconate Cloth  6 each Topical Q0600  . heparin  5,000 Units Subcutaneous Q8H  . hydrALAZINE  50 mg Oral Q8H  . losartan  100 mg Oral Daily  . sulfamethoxazole-trimethoprim  1 tablet Oral Daily   Continuous Infusions: . cefTRIAXone  (ROCEPHIN)  IV 2 g (06/22/20 1417)     Anti-infectives (From admission, onward)   Start     Dose/Rate Route Frequency Ordered Stop   06/19/20 2000  sulfamethoxazole-trimethoprim (BACTRIM) 400-80 MG per tablet 1 tablet        1 tablet Oral Daily 06/19/20 1729     06/16/20 1130  cefTRIAXone (ROCEPHIN) 2 g in sodium chloride 0.9 % 100 mL IVPB        2 g 200 mL/hr over 30 Minutes Intravenous Every 24 hours 06/16/20 1123     06/16/20 1130  azithromycin (ZITHROMAX) 500 mg in sodium chloride 0.9 % 250 mL IVPB  Status:  Discontinued        500 mg 250 mL/hr over 60 Minutes Intravenous Every 24 hours 06/16/20 1123 06/18/20 2016             Family Communication/Anticipated D/C date and plan/Code Status   DVT prophylaxis: Place and maintain sequential compression device Start: 06/17/20 0741 heparin injection 5,000 Units Start: 06/16/20 1400     Code Status: Full Code  Family Communication: Plan discussed with patient Disposition Plan:    Status is: Inpatient  Remains inpatient appropriate because:IV treatments appropriate due to intensity of illness or inability to take PO   Dispo: The patient is from: Home              Anticipated d/c is  to: Home              Anticipated d/c date is: 1 day              Patient currently is not medically stable to d/c.  Awaiting final recommendations from ID.  Patient has to get an outpatient hemodialysis spot prior to discharge.           Subjective:   Interval events noted.  He said he had significant nausea yesterday but is better today.  He had some bleeding from PermCath site today.  Objective:    Vitals:   06/22/20 0945 06/22/20 1250 06/22/20 1348 06/22/20 1543  BP:  130/88 (!) 127/96 122/79  Pulse:   (!) 117   Resp:   14   Temp: 98.2 F (36.8 C)  98.6 F (37 C)   TempSrc:   Oral   SpO2:   97%   Weight:      Height:       No data found.   Intake/Output Summary (Last 24 hours) at 06/22/2020 1607 Last data filed  at 06/22/2020 1327 Gross per 24 hour  Intake 100 ml  Output 1534 ml  Net -1434 ml   Filed Weights   06/16/20 2228  Weight: 60.8 kg    Exam:  GEN: NAD SKIN: No rash.  No active bleeding noted from PermCath on the right upper chest wall at the time of my exam. EYES: EOMI ENT: MMM CV: RRR PULM: CTA B ABD: soft, ND, NT, +BS CNS: AAO x 3, non focal EXT: No edema or tenderness    Data Reviewed:   I have personally reviewed following labs and imaging studies:  Labs: Labs show the following:   Basic Metabolic Panel: Recent Labs  Lab 06/16/20 2031 06/16/20 2031 06/17/20 0540 06/17/20 0540 06/18/20 0655 06/18/20 0655 06/19/20 0621 06/20/20 0543  NA 132*  --  130*  --  132*  --  131* 133*  K 5.0   < > 5.4*   < > 4.5   < > 4.3 4.0  CL 102  --  101  --  98  --  98 97*  CO2 16*  --  13*  --  16*  --  16* 21*  GLUCOSE 108*  --  88  --  102*  --  95 108*  BUN 131*  --  145*  --  127*  --  116* 74*  CREATININE 15.38*  --  16.44*  --  16.42*  --  14.31* 10.51*  CALCIUM 7.7*  --  7.7*  --  7.7*  --  7.2* 8.0*   < > = values in this interval not displayed.   GFR Estimated Creatinine Clearance: 8.8 mL/min (A) (by C-G formula based on SCr of 10.51 mg/dL (H)). Liver Function Tests: Recent Labs  Lab 06/16/20 0832  AST 15  ALT 30  ALKPHOS 63  BILITOT 0.7  PROT 8.5*  ALBUMIN 3.2*   No results for input(s): LIPASE, AMYLASE in the last 168 hours. No results for input(s): AMMONIA in the last 168 hours. Coagulation profile No results for input(s): INR, PROTIME in the last 168 hours.  CBC: Recent Labs  Lab 06/16/20 0832 06/16/20 0832 06/17/20 0540 06/17/20 2250 06/18/20 0655 06/19/20 0621 06/20/20 0543  WBC 16.2*  --  10.8*  --  10.3 8.3 9.3  NEUTROABS 14.7*  --   --   --   --   --   --   HGB  7.4*   < > 6.5* 8.2* 9.1* 7.5* 9.3*  HCT 22.3*   < > 19.3* 23.9* 26.0* 22.4* 26.6*  MCV 78.5*  --  78.1*  --  76.5* 79.7* 78.9*  PLT 168  --  149*  --  180 158 220   < > =  values in this interval not displayed.   Cardiac Enzymes: No results for input(s): CKTOTAL, CKMB, CKMBINDEX, TROPONINI in the last 168 hours. BNP (last 3 results) No results for input(s): PROBNP in the last 8760 hours. CBG: No results for input(s): GLUCAP in the last 168 hours. D-Dimer: No results for input(s): DDIMER in the last 72 hours. Hgb A1c: No results for input(s): HGBA1C in the last 72 hours. Lipid Profile: No results for input(s): CHOL, HDL, LDLCALC, TRIG, CHOLHDL, LDLDIRECT in the last 72 hours. Thyroid function studies: No results for input(s): TSH, T4TOTAL, T3FREE, THYROIDAB in the last 72 hours.  Invalid input(s): FREET3 Anemia work up: No results for input(s): VITAMINB12, FOLATE, FERRITIN, TIBC, IRON, RETICCTPCT in the last 72 hours. Sepsis Labs: Recent Labs  Lab 06/16/20 0832 06/16/20 0832 06/16/20 1204 06/17/20 0540 06/18/20 0655 06/19/20 0621 06/20/20 0543  PROCALCITON >150.00  --   --   --   --   --   --   WBC 16.2*   < >  --  10.8* 10.3 8.3 9.3  LATICACIDVEN 0.9  --  0.7  --   --   --   --    < > = values in this interval not displayed.    Microbiology Recent Results (from the past 240 hour(s))  SARS Coronavirus 2 by RT PCR (hospital order, performed in [facility] hospital lab) Nasopharyngeal Nasopharyngeal Swab     Status: None   Collection Time: 06/16/20  8:32 AM   Specimen: Nasopharyngeal Swab  Result Value Ref Range Status   SARS Coronavirus 2 NEGATIVE NEGATIVE Final    Comment: (NOTE) SARS-CoV-2 target nucleic acids are NOT DETECTED.  The SARS-CoV-2 RNA is generally detectable in upper and lower respiratory specimens during the acute phase of infection. The lowest concentration of SARS-CoV-2 viral copies this assay can detect is 250 copies / mL. A negative result does not preclude SARS-CoV-2 infection and should not be used as the sole basis for treatment or other patient management decisions.  A negative result may occur with improper  specimen collection / handling, submission of specimen other than nasopharyngeal swab, presence of viral mutation(s) within the areas targeted by this assay, and inadequate number of viral copies (<250 copies / mL). A negative result must be combined with clinical observations, patient history, and epidemiological information.  Fact Sheet for Patients:   StrictlyIdeas.no  Fact Sheet for Healthcare Providers: BankingDealers.co.za  This test is not yet approved or  cleared by the [place] FDA and has been authorized for detection and/or diagnosis of SARS-CoV-2 by FDA under an Emergency Use Authorization (EUA).  This EUA will remain in effect (meaning this test can be used) for the duration of the COVID-19 declaration under Section 564(b)(1) of the Act, 21 U.S.C. section 360bbb-3(b)(1), unless the authorization is terminated or revoked sooner.  Performed at [facility], [address] [place], [place] [address]   Blood Culture (routine x 2)     Status: Abnormal   Collection Time: 06/16/20  8:32 AM   Specimen: BLOOD  Result Value Ref Range Status   Specimen Description   Final    BLOOD LEFT FORE ARM Performed at [facility]  [facility], [address] [place], [place] [address]    Special Requests   Final    BOTTLES DRAWN AEROBIC AND ANAEROBIC Blood Culture results may not be optimal due to an inadequate volume of blood received in culture bottles Performed at [facility], [address], [place], [place] [address]    Culture  Setup Time   Final    GRAM POSITIVE COCCI IN BOTH AEROBIC AND ANAEROBIC BOTTLES CRITICAL VALUE NOTED.  VALUE IS CONSISTENT WITH PREVIOUSLY REPORTED AND CALLED VALUE. Performed at [facility], [address] [place], [place] [address]    Culture (A)  Final    STREPTOCOCCUS PNEUMONIAE SUSCEPTIBILITIES PERFORMED ON PREVIOUS CULTURE WITHIN THE LAST 5 DAYS. Performed at  [facility], [address] [place], [place] [address]    Report Status 06/19/2020 FINAL  Final  Blood Culture (routine x 2)     Status: Abnormal   Collection Time: 06/16/20  8:32 AM   Specimen: BLOOD  Result Value Ref Range Status   Specimen Description   Final    BLOOD LEFT FORE ARM Performed at [facility], [address], [place], [place] [address]    Special Requests   Final    BOTTLES DRAWN AEROBIC AND ANAEROBIC Blood Culture results may not be optimal due to an inadequate volume of blood received in culture bottles Performed at [facility], [address] [place], [place] [address]    Culture  Setup Time   Final    Organism ID to follow [place] CRITICAL RESULT CALLED TO, READ BACK BY AND VERIFIED WITH: Ken[place] 06/16/20 AT 2148 HS Performed at [facility], [place] [address], [place], [place] [address]    Culture STREPTOCOCCUS PNEUMONIAE (A)  Final   Report Status 06/19/2020 FINAL  Final   Organism ID, Bacteria STREPTOCOCCUS PNEUMONIAE  Final      Susceptibility   Streptococcus pneumoniae - MIC*    ERYTHROMYCIN >=8 RESISTANT Resistant     LEVOFLOXACIN 0.5 SENSITIVE Sensitive     VANCOMYCIN 0.5 SENSITIVE Sensitive     PENICILLIN (meningitis) 1 RESISTANT Resistant     PENO - penicillin 1      PENICILLIN (non-meningitis) 1 SENSITIVE Sensitive     PENICILLIN (oral) 1 INTERMEDIATE Intermediate     CEFTRIAXONE (non-meningitis) 1 SENSITIVE Sensitive     CEFTRIAXONE (meningitis) 1 INTERMEDIATE Intermediate     * STREPTOCOCCUS PNEUMONIAE  Blood Culture ID Panel (Reflexed)     Status: Abnormal   Collection Time: 06/16/20  8:32 AM  Result Value Ref Range Status   Enterococcus faecalis NOT DETECTED NOT DETECTED Final   Enterococcus Faecium NOT DETECTED NOT DETECTED Final   Listeria monocytogenes NOT DETECTED NOT DETECTED Final   Staphylococcus species NOT DETECTED NOT DETECTED Final    Staphylococcus aureus (BCID) NOT DETECTED NOT DETECTED Final   Staphylococcus epidermidis NOT DETECTED NOT DETECTED Final   Staphylococcus lugdunensis NOT DETECTED NOT DETECTED Final   Streptococcus species DETECTED (A) NOT DETECTED Final    Comment: CRITICAL RESULT CALLED TO, READ BACK BY AND VERIFIED WITH: DAVID BESANTI 06/16/20 AT 2148 HS    Streptococcus agalactiae NOT DETECTED NOT DETECTED Final   Streptococcus pneumoniae DETECTED (A) NOT DETECTED Final    Comment: CRITICAL RESULT CALLED TO, READ BACK BY AND VERIFIED WITH: DAVID BESANTI 06/16/20 AT 2148 HS    Streptococcus pyogenes NOT DETECTED NOT DETECTED Final   A.calcoaceticus-baumannii NOT DETECTED  NOT DETECTED Final   Bacteroides fragilis NOT DETECTED NOT DETECTED Final   Enterobacterales NOT DETECTED NOT DETECTED Final   Enterobacter cloacae complex NOT DETECTED NOT DETECTED Final   Escherichia coli NOT DETECTED NOT DETECTED Final   Klebsiella aerogenes NOT DETECTED NOT DETECTED Final   Klebsiella oxytoca NOT DETECTED NOT DETECTED Final   Klebsiella pneumoniae NOT DETECTED NOT DETECTED Final   Proteus species NOT DETECTED NOT DETECTED Final   Salmonella species NOT DETECTED NOT DETECTED Final   Serratia marcescens NOT DETECTED NOT DETECTED Final   Haemophilus influenzae NOT DETECTED NOT DETECTED Final   Neisseria meningitidis NOT DETECTED NOT DETECTED Final   Pseudomonas aeruginosa NOT DETECTED NOT DETECTED Final   Stenotrophomonas maltophilia NOT DETECTED NOT DETECTED Final   Candida albicans NOT DETECTED NOT DETECTED Final   Candida auris NOT DETECTED NOT DETECTED Final   Candida glabrata NOT DETECTED NOT DETECTED Final   Candida krusei NOT DETECTED NOT DETECTED Final   Candida parapsilosis NOT DETECTED NOT DETECTED Final   Candida tropicalis NOT DETECTED NOT DETECTED Final   Cryptococcus neoformans/gattii NOT DETECTED NOT DETECTED Final    Comment: Performed at [facility], [address] [place],  [place] [address]    Procedures and diagnostic studies:  No results found.             LOS: 6 days   Daniel Combs  [facility]   Pager on [email or web address]. If 7PM-7AM, please contact night-coverage at [email or web address]     06/22/2020, 4:07 PM

## 2020-06-23 LAB — RENAL FUNCTION PANEL
Albumin: 2.2 g/dL — ABNORMAL LOW (ref 3.5–5.0)
Anion gap: 11 (ref 5–15)
BUN: 40 mg/dL — ABNORMAL HIGH (ref 6–20)
CO2: 27 mmol/L (ref 22–32)
Calcium: 7.7 mg/dL — ABNORMAL LOW (ref 8.9–10.3)
Chloride: 92 mmol/L — ABNORMAL LOW (ref 98–111)
Creatinine, Ser: 7.66 mg/dL — ABNORMAL HIGH (ref 0.61–1.24)
GFR calc Af Amer: 10 mL/min — ABNORMAL LOW (ref >60)
GFR calc non Af Amer: 9 mL/min — ABNORMAL LOW (ref >60)
Glucose, Bld: 111 mg/dL — ABNORMAL HIGH (ref 70–99)
Phosphorus: 7 mg/dL — ABNORMAL HIGH (ref 2.5–4.6)
Potassium: 4.1 mmol/L (ref 3.5–5.1)
Sodium: 130 mmol/L — ABNORMAL LOW (ref 135–145)

## 2020-06-23 LAB — PREPARE RBC (CROSSMATCH)

## 2020-06-23 LAB — PROTIME-INR
INR: 1.1 (ref 0.8–1.2)
Prothrombin Time: 13.9 seconds (ref 11.4–15.2)

## 2020-06-23 LAB — APTT: aPTT: 41 seconds — ABNORMAL HIGH (ref 24–36)

## 2020-06-23 LAB — HEMOGLOBIN AND HEMATOCRIT, BLOOD
HCT: 22 % — ABNORMAL LOW (ref 39.0–52.0)
Hemoglobin: 7.2 g/dL — ABNORMAL LOW (ref 13.0–17.0)

## 2020-06-23 MED ORDER — ACETAMINOPHEN 500 MG PO TABS
1000.0000 mg | ORAL_TABLET | Freq: Once | ORAL | Status: AC
  Administered 2020-06-23: 1000 mg via ORAL
  Filled 2020-06-23: qty 2

## 2020-06-23 NOTE — Progress Notes (Signed)
Pt already has IS & states he has been trained in its purpose & use.

## 2020-06-23 NOTE — Progress Notes (Addendum)
[facility]  ROUNDING NOTE   Subjective:   Patient reports that his dialysis catheter continues to bleed. Reports he woke up covered in blood.  Received a blood transfusion last night.   Objective:  Vital signs in last 24 hours:  Temp:  [98.4 F (36.9 C)-100.6 F (38.1 C)] 99.4 F (37.4 C) (09/18 1126) Pulse Rate:  [94-117] 109 (09/18 1126) Resp:  [14-20] 16 (09/18 1126) BP: (121-140)/(79-105) 140/105 (09/18 1314) SpO2:  [94 %-97 %] 96 % (09/18 1126)  Weight change:  Filed Weights   06/16/20 2228  Weight: 60.8 kg    Intake/Output: I/O last 3 completed shifts: In: 350 [Blood:350] Out: 4132 [Urine:325; GMWNU:2725]   Intake/Output this shift:  No intake/output data recorded.  Physical Exam: General: NAD,   Head: Normocephalic, atraumatic. Moist oral mucosal membranes  Eyes: Anicteric, PERRL  Neck: Supple, trachea midline  Lungs:  Clear to auscultation  Heart: Regular rate and rhythm  Abdomen:  Soft, nontender  Extremities:  No peripheral edema.  Neurologic: Nonfocal, moving all four extremities  Skin: No lesions  Access: RIJ permcath, placed 06/18/20. Covered with pressure dressing.     Basic Metabolic Panel: Recent Labs  Lab 06/17/20 0540 06/17/20 0540 06/18/20 0655 06/18/20 0655 06/19/20 0621 06/20/20 0543 06/22/20 0447 06/23/20 0911  NA 130*  --  132*  --  131* 133*  --  130*  K 5.4*  --  4.5  --  4.3 4.0  --  4.1  CL 101  --  98  --  98 97*  --  92*  CO2 13*  --  16*  --  16* 21*  --  27  GLUCOSE 88  --  102*  --  95 108*  --  111*  BUN 145*  --  127*  --  116* 74*  --  40*  CREATININE 16.44*  --  16.42*  --  14.31* 10.51*  --  7.66*  CALCIUM 7.7*   < > 7.7*   < > 7.2* 8.0*  --  7.7*  PHOS  --   --   --   --   --   --  8.0* 7.0*   < > = values in this interval not displayed.    Liver Function Tests: Recent Labs  Lab 06/23/20 0911  ALBUMIN 2.2*   No results for input(s): LIPASE, AMYLASE in the last 168 hours. No results for  input(s): AMMONIA in the last 168 hours.  CBC: Recent Labs  Lab 06/17/20 0540 06/17/20 2250 06/18/20 0655 06/19/20 0621 06/20/20 0543 06/22/20 2319 06/23/20 0911  WBC 10.8*  --  10.3 8.3 9.3  --   --   HGB 6.5*   < > 9.1* 7.5* 9.3* 6.5* 7.2*  HCT 19.3*   < > 26.0* 22.4* 26.6* 20.4* 22.0*  MCV 78.1*  --  76.5* 79.7* 78.9*  --   --   PLT 149*  --  180 158 220  --   --    < > = values in this interval not displayed.    Cardiac Enzymes: No results for input(s): CKTOTAL, CKMB, CKMBINDEX, TROPONINI in the last 168 hours.  BNP: Invalid input(s): POCBNP  CBG: No results for input(s): GLUCAP in the last 168 hours.  Microbiology: Results for orders placed or performed during the hospital encounter of 06/16/20  SARS Coronavirus 2 by RT PCR (hospital order, performed in [facility] hospital lab) Nasopharyngeal Nasopharyngeal Swab     Status: None   Collection Time:  06/16/20  8:32 AM   Specimen: Nasopharyngeal Swab  Result Value Ref Range Status   SARS Coronavirus 2 NEGATIVE NEGATIVE Final    Comment: (NOTE) SARS-CoV-2 target nucleic acids are NOT DETECTED.  The SARS-CoV-2 RNA is generally detectable in upper and lower respiratory specimens during the acute phase of infection. The lowest concentration of SARS-CoV-2 viral copies this assay can detect is 250 copies / mL. A negative result does not preclude SARS-CoV-2 infection and should not be used as the sole basis for treatment or other patient management decisions.  A negative result may occur with improper specimen collection / handling, submission of specimen other than nasopharyngeal swab, presence of viral mutation(s) within the areas targeted by this assay, and inadequate number of viral copies (<250 copies / mL). A negative result must be combined with clinical observations, patient history, and epidemiological information.  Fact Sheet for Patients:   StrictlyIdeas.no  Fact Sheet for  Healthcare Providers: BankingDealers.co.za  This test is not yet approved or  cleared by the [place] FDA and has been authorized for detection and/or diagnosis of SARS-CoV-2 by FDA under an Emergency Use Authorization (EUA).  This EUA will remain in effect (meaning this test can be used) for the duration of the COVID-19 declaration under Section 564(b)(1) of the Act, 21 U.S.C. section 360bbb-3(b)(1), unless the authorization is terminated or revoked sooner.  Performed at [facility], [address], [place], [place] [address]   Blood Culture (routine x 2)     Status: Abnormal   Collection Time: 06/16/20  8:32 AM   Specimen: BLOOD  Result Value Ref Range Status   Specimen Description   Final    BLOOD LEFT FORE ARM Performed at [facility], [address], [place], [place] [address]    Special Requests   Final    BOTTLES DRAWN AEROBIC AND ANAEROBIC Blood Culture results may not be optimal due to an inadequate volume of blood received in culture bottles Performed at [facility], [address] [place], [place] [address]    Culture  Setup Time   Final    GRAM POSITIVE COCCI IN BOTH AEROBIC AND ANAEROBIC BOTTLES CRITICAL VALUE NOTED.  VALUE IS CONSISTENT WITH PREVIOUSLY REPORTED AND CALLED VALUE. Performed at [facility], [address] [place], [place] [address]    Culture (A)  Final    STREPTOCOCCUS PNEUMONIAE SUSCEPTIBILITIES PERFORMED ON PREVIOUS CULTURE WITHIN THE LAST 5 DAYS. Performed at [facility], [place] [address], [place], [place] [address]    Report Status 06/19/2020 FINAL  Final  Blood Culture (routine x 2)     Status: Abnormal   Collection Time: 06/16/20  8:32 AM   Specimen: BLOOD  Result Value Ref Range Status   Specimen Description   Final    BLOOD LEFT FORE ARM Performed at [facility], [address], [place], [place] [address]    Special Requests   Final    BOTTLES  DRAWN AEROBIC AND ANAEROBIC Blood Culture results may not be optimal due to an inadequate volume of blood received in culture bottles Performed at [facility], [address] [place], [place] [address]    Culture  Setup Time   Final    Organism ID to follow [place] CRITICAL RESULT CALLED TO, READ BACK BY AND VERIFIED WITH: [place] 06/16/20 AT 2148 HS Performed at [facility], [place] [address] [place], [place] [address]  Culture STREPTOCOCCUS PNEUMONIAE (A)  Final   Report Status 06/19/2020 FINAL  Final   Organism ID, Bacteria STREPTOCOCCUS PNEUMONIAE  Final      Susceptibility   Streptococcus pneumoniae - MIC*    ERYTHROMYCIN >=8 RESISTANT Resistant     LEVOFLOXACIN 0.5 SENSITIVE Sensitive     VANCOMYCIN 0.5 SENSITIVE Sensitive     PENICILLIN (meningitis) 1 RESISTANT Resistant     PENO - penicillin 1      PENICILLIN (non-meningitis) 1 SENSITIVE Sensitive     PENICILLIN (oral) 1 INTERMEDIATE Intermediate     CEFTRIAXONE (non-meningitis) 1 SENSITIVE Sensitive     CEFTRIAXONE (meningitis) 1 INTERMEDIATE Intermediate     * STREPTOCOCCUS PNEUMONIAE  Blood Culture ID Panel (Reflexed)     Status: Abnormal   Collection Time: 06/16/20  8:32 AM  Result Value Ref Range Status   Enterococcus faecalis NOT DETECTED NOT DETECTED Final   Enterococcus Faecium NOT DETECTED NOT DETECTED Final   Listeria monocytogenes NOT DETECTED NOT DETECTED Final   Staphylococcus species NOT DETECTED NOT DETECTED Final   Staphylococcus aureus (BCID) NOT DETECTED NOT DETECTED Final   Staphylococcus epidermidis NOT DETECTED NOT DETECTED Final   Staphylococcus lugdunensis NOT DETECTED NOT DETECTED Final   Streptococcus species DETECTED (A) NOT DETECTED Final    Comment: CRITICAL RESULT CALLED TO, READ BACK BY AND VERIFIED WITH: DAVID BESANTI 06/16/20 AT 2148 HS    Streptococcus agalactiae NOT DETECTED NOT DETECTED Final   Streptococcus  pneumoniae DETECTED (A) NOT DETECTED Final    Comment: CRITICAL RESULT CALLED TO, READ BACK BY AND VERIFIED WITH: DAVID BESANTI 06/16/20 AT 2148 HS    Streptococcus pyogenes NOT DETECTED NOT DETECTED Final   A.calcoaceticus-baumannii NOT DETECTED NOT DETECTED Final   Bacteroides fragilis NOT DETECTED NOT DETECTED Final   Enterobacterales NOT DETECTED NOT DETECTED Final   Enterobacter cloacae complex NOT DETECTED NOT DETECTED Final   Escherichia coli NOT DETECTED NOT DETECTED Final   Klebsiella aerogenes NOT DETECTED NOT DETECTED Final   Klebsiella oxytoca NOT DETECTED NOT DETECTED Final   Klebsiella pneumoniae NOT DETECTED NOT DETECTED Final   Proteus species NOT DETECTED NOT DETECTED Final   Salmonella species NOT DETECTED NOT DETECTED Final   Serratia marcescens NOT DETECTED NOT DETECTED Final   Haemophilus influenzae NOT DETECTED NOT DETECTED Final   Neisseria meningitidis NOT DETECTED NOT DETECTED Final   Pseudomonas aeruginosa NOT DETECTED NOT DETECTED Final   Stenotrophomonas maltophilia NOT DETECTED NOT DETECTED Final   Candida albicans NOT DETECTED NOT DETECTED Final   Candida auris NOT DETECTED NOT DETECTED Final   Candida glabrata NOT DETECTED NOT DETECTED Final   Candida krusei NOT DETECTED NOT DETECTED Final   Candida parapsilosis NOT DETECTED NOT DETECTED Final   Candida tropicalis NOT DETECTED NOT DETECTED Final   Cryptococcus neoformans/gattii NOT DETECTED NOT DETECTED Final    Comment: Performed at [facility], [address] [place], [place] [address]    Coagulation Studies: Recent Labs    06/23/20 0032  LABPROT 13.9  INR 1.1    Urinalysis: No results for input(s): COLORURINE, LABSPEC, PHURINE, GLUCOSEU, HGBUR, BILIRUBINUR, KETONESUR, PROTEINUR, UROBILINOGEN, NITRITE, LEUKOCYTESUR in the last 72 hours.  Invalid input(s): APPERANCEUR    Imaging: No results found.   Medications:    cefTRIAXone (ROCEPHIN)  IV 2 g (06/23/20 1320)    sodium  chloride   Intravenous Once   amLODipine  10 mg Oral Daily   aspirin EC  81 mg Oral Daily   carvedilol  25  mg Oral BID WC   Chlorhexidine Gluconate Cloth  6 each Topical Q0600   heparin  5,000 Units Subcutaneous Q8H   hydrALAZINE  50 mg Oral Q8H   losartan  100 mg Oral Daily   sulfamethoxazole-trimethoprim  1 tablet Oral Daily   acetaminophen, chlorpheniramine-HYDROcodone, hydrALAZINE, HYDROmorphone (DILAUDID) injection, labetalol, morphine injection, ondansetron (ZOFRAN) IV, oxyCODONE-acetaminophen, promethazine  Assessment/ Plan:  Mr. Daniel Combs is a 29 y.o.  male with a PMHx of longstanding hypertension, ESRD not yet on dialysis, anemia of chronic kidney disease, secondary hyperparathyroidism, HIV, who was admitted to [facility] on 06/16/2020 for evaluation of shortness of breath and chest pain.    1.  ESRD initiated on dialysis this admission Patient was last seen by nephrologist in [place] approximately 1 year ago.  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  Kidney is atrophic on renal ultrasound. -Patient has completed three dialysis treatments on a M,W,F schedule.   Tolerating treatments well. -Outpatient hemodialysis center placement at [place]. [employer]. - Vascular placed a stitch to help control the bleeding, they are following him.    2.  Hypertension.   -Blood pressure overall improved as compared to admission.  - Continue amlodipine, carvedilol, hydralazine, and losartan.   3.  Anemia of chronic kidney disease.  - Hemoglobin 7.2, post transfusion last night  -  Avoid further blood transfusions as possible; due to possibility of a future transplant    4.  Hyperkalemia.  -Potassium corrected with dialysis treatments. - k 4.1   LOS:  7 Madison P Tedrow 9/18/20211:46 PM  Patient was seen and examined with [facility].  Above plan was discussed and agreed upon on the signing of this note. Appreciate vascular input.   [place] Dana, [facility]  9/18/20212:21 PM

## 2020-06-23 NOTE — Progress Notes (Signed)
Cross Cover Nurse reports patient with bleeding at catheter site requiring multiple dressing reinforcements throughout day.  H & H obtained. HGB decrease from 9.3 to 6.5 Transfuse one unit of blood and recheck hgb Apply surgicel to cath site after blood started Checking coags

## 2020-06-23 NOTE — Progress Notes (Signed)
Notified NP that pt's hgb is 6.5. Orders placed to transfuse 1 unit of blood.

## 2020-06-23 NOTE — Progress Notes (Signed)
Notified Sharion Settler, NP that pt's perm cath had an increase amount of bleeding during report at shift change. Per day shift nurse MD & vascular aware of bleeding. NP placed orders for H&H, and to not remove dressing only reinforce. Will continue to monitor. VS stable.

## 2020-06-23 NOTE — Progress Notes (Signed)
Called to bedside for significant bleeding from Permacath entry site.  Area cleaned with Hibicleanse. 3-0 nylon pursestring placed around entry of catheter site. Hemostasis obtained. Dry sterile dressing and Biopatch placed.  Examined 30 minutes after hemostasis obtained without further bleeding.

## 2020-06-23 NOTE — Progress Notes (Signed)
Notified NP that dressing continues to bleed, and needs more reinforcement. NP came to bedside, and reinforced with pressure dressing. Surgicel at bedside if needed. VS stable. Will continue to monitor.

## 2020-06-23 NOTE — Progress Notes (Addendum)
Progress Note    Daniel Combs  CWC:[id number] DOB: 1991/01/04  DOA: 06/16/2020 PCP: Pcp, No      Brief Narrative:    Medical records reviewed and are as summarized below:  Daniel Combs is a 29 y.o. male       Assessment/Plan:   Principal Problem:   Sepsis ([place]) Active Problems:   CAP (community acquired pneumonia)   Bacteremia due to Streptococcus pneumoniae   Hypertension   ESRD (end stage renal disease) ([place])   Hyponatremia   Hyperkalemia   Elevated troponin   Anemia in ESRD (end-stage renal disease) ([facility]) Strep pneumo bacteremia-strep pneumo antigen positive and strep pneumo in blood HIV infection Immunocompromised state-CD4 count of 74 Acute blood loss anemia from bleeding around permacath site  Body mass index is 21.63 kg/m.    PLAN  Continue IV Rocephin for strep pneumoniae sepsis, pneumonia and bacteremia.  Continue Bactrim for PCP prophylaxis  Patient was evaluated by vascular surgeon today.  Area around [place] site was sutured.  S/p transfusion with 1 unit of PRBCs on 06/17/2020 and 06/23/2020.  Hemoglobin is 7.2. Discussed treatment plan with nephrologist, Dr. Juleen China.  He said to hold off on further blood transfusion for now.  S/p treatment with IV Venofer on 06/19/2020 and 06/20/2020  Follow-up with nephrologist for hemodialysis.  Follow-up with case manager to assist with obtaining outpatient hemodialysis spot prior to discharge.   Diet Order            Diet renal/carb modified with fluid restriction Diet-HS Snack? Nothing; Fluid restriction: 1200 mL Fluid; Room service appropriate? Yes; Fluid consistency: Thin  Diet effective now                    Consultants:  Nephrologist  Infectious disease  Procedures:  Placement of tunneled hemodialysis catheter via right IJ vein on 06/18/2020  Hemodialysis    Medications:   . sodium chloride   Intravenous Once  . amLODipine  10 mg Oral Daily  . aspirin EC  81 mg  Oral Daily  . carvedilol  25 mg Oral BID WC  . Chlorhexidine Gluconate Cloth  6 each Topical Q0600  . heparin  5,000 Units Subcutaneous Q8H  . hydrALAZINE  50 mg Oral Q8H  . losartan  100 mg Oral Daily  . sulfamethoxazole-trimethoprim  1 tablet Oral Daily   Continuous Infusions: . cefTRIAXone (ROCEPHIN)  IV 2 g (06/23/20 1320)     Anti-infectives (From admission, onward)   Start     Dose/Rate Route Frequency Ordered Stop   06/19/20 2000  sulfamethoxazole-trimethoprim (BACTRIM) 400-80 MG per tablet 1 tablet        1 tablet Oral Daily 06/19/20 1729     06/16/20 1130  cefTRIAXone (ROCEPHIN) 2 g in sodium chloride 0.9 % 100 mL IVPB        2 g 200 mL/hr over 30 Minutes Intravenous Every 24 hours 06/16/20 1123     06/16/20 1130  azithromycin (ZITHROMAX) 500 mg in sodium chloride 0.9 % 250 mL IVPB  Status:  Discontinued        500 mg 250 mL/hr over 60 Minutes Intravenous Every 24 hours 06/16/20 1123 06/18/20 2016             Family Communication/Anticipated D/C date and plan/Code Status   DVT prophylaxis: Place and maintain sequential compression device Start: 06/17/20 0741 heparin injection 5,000 Units Start: 06/16/20 1400     Code Status: Full Code  Family Communication: Plan discussed  with patient Disposition Plan:    Status is: Inpatient  Remains inpatient appropriate because:IV treatments appropriate due to intensity of illness or inability to take PO   Dispo: The patient is from: Home              Anticipated d/c is to: Home              Anticipated d/c date is: 3 days              Patient currently is not medically stable to d/c.  Awaiting final recommendations from ID.  Patient has to get an outpatient hemodialysis spot prior to discharge.           Subjective:   Overnight events noted.  Patient had bleeding from permacath site on the right upper chest wall last night.  Patient required blood transfusion for acute blood loss anemia  overnight.  Objective:    Vitals:   06/23/20 0610 06/23/20 1126 06/23/20 1314 06/23/20 1527  BP: 125/84 (!) 129/93 (!) 140/105 (!) 134/92  Pulse: 94 (!) 109    Resp: 15 16    Temp: 98.4 F (36.9 C) 99.4 F (37.4 C)    TempSrc: Oral Oral    SpO2: 97% 96%    Weight:      Height:       No data found.   Intake/Output Summary (Last 24 hours) at 06/23/2020 1558 Last data filed at 06/23/2020 1300 Gross per 24 hour  Intake 350 ml  Output 150 ml  Net 200 ml   Filed Weights   06/16/20 2228  Weight: 60.8 kg    Exam:  GEN: NAD SKIN: No rash.  No active bleeding from permacath site right upper chest wall. EYES: Pale but anicteric ENT: MMM CV: RRR PULM: CTA B ABD: soft, ND, NT, +BS CNS: AAO x 3, non focal EXT: No edema or tenderness    Data Reviewed:   I have personally reviewed following labs and imaging studies:  Labs: Labs show the following:   Basic Metabolic Panel: Recent Labs  Lab 06/17/20 0540 06/17/20 0540 06/18/20 0655 06/18/20 0655 06/19/20 0621 06/19/20 0621 06/20/20 0543 06/22/20 0447 06/23/20 0911  NA 130*  --  132*  --  131*  --  133*  --  130*  K 5.4*   < > 4.5   < > 4.3   < > 4.0  --  4.1  CL 101  --  98  --  98  --  97*  --  92*  CO2 13*  --  16*  --  16*  --  21*  --  27  GLUCOSE 88  --  102*  --  95  --  108*  --  111*  BUN 145*  --  127*  --  116*  --  74*  --  40*  CREATININE 16.44*  --  16.42*  --  14.31*  --  10.51*  --  7.66*  CALCIUM 7.7*  --  7.7*  --  7.2*  --  8.0*  --  7.7*  PHOS  --   --   --   --   --   --   --  8.0* 7.0*   < > = values in this interval not displayed.   GFR Estimated Creatinine Clearance: 12.1 mL/min (A) (by C-G formula based on SCr of 7.66 mg/dL (H)). Liver Function Tests: Recent Labs  Lab 06/23/20 0911  ALBUMIN 2.2*   No results  for input(s): LIPASE, AMYLASE in the last 168 hours. No results for input(s): AMMONIA in the last 168 hours. Coagulation profile Recent Labs  Lab 06/23/20 0032  INR  1.1    CBC: Recent Labs  Lab 06/17/20 0540 06/17/20 2250 06/18/20 0655 06/19/20 0621 06/20/20 0543 06/22/20 2319 06/23/20 0911  WBC 10.8*  --  10.3 8.3 9.3  --   --   HGB 6.5*   < > 9.1* 7.5* 9.3* 6.5* 7.2*  HCT 19.3*   < > 26.0* 22.4* 26.6* 20.4* 22.0*  MCV 78.1*  --  76.5* 79.7* 78.9*  --   --   PLT 149*  --  180 158 220  --   --    < > = values in this interval not displayed.   Cardiac Enzymes: No results for input(s): CKTOTAL, CKMB, CKMBINDEX, TROPONINI in the last 168 hours. BNP (last 3 results) No results for input(s): PROBNP in the last 8760 hours. CBG: No results for input(s): GLUCAP in the last 168 hours. D-Dimer: No results for input(s): DDIMER in the last 72 hours. Hgb A1c: No results for input(s): HGBA1C in the last 72 hours. Lipid Profile: No results for input(s): CHOL, HDL, LDLCALC, TRIG, CHOLHDL, LDLDIRECT in the last 72 hours. Thyroid function studies: No results for input(s): TSH, T4TOTAL, T3FREE, THYROIDAB in the last 72 hours.  Invalid input(s): FREET3 Anemia work up: No results for input(s): VITAMINB12, FOLATE, FERRITIN, TIBC, IRON, RETICCTPCT in the last 72 hours. Sepsis Labs: Recent Labs  Lab 06/17/20 0540 06/18/20 0655 06/19/20 0621 06/20/20 0543  WBC 10.8* 10.3 8.3 9.3    Microbiology Recent Results (from the past 240 hour(s))  SARS Coronavirus 2 by RT PCR (hospital order, performed in [facility] hospital lab) Nasopharyngeal Nasopharyngeal Swab     Status: None   Collection Time: 06/16/20  8:32 AM   Specimen: Nasopharyngeal Swab  Result Value Ref Range Status   SARS Coronavirus 2 NEGATIVE NEGATIVE Final    Comment: (NOTE) SARS-CoV-2 target nucleic acids are NOT DETECTED.  The SARS-CoV-2 RNA is generally detectable in upper and lower respiratory specimens during the acute phase of infection. The lowest concentration of SARS-CoV-2 viral copies this assay can detect is 250 copies / mL. A negative result does not preclude SARS-CoV-2  infection and should not be used as the sole basis for treatment or other patient management decisions.  A negative result may occur with improper specimen collection / handling, submission of specimen other than nasopharyngeal swab, presence of viral mutation(s) within the areas targeted by this assay, and inadequate number of viral copies (<250 copies / mL). A negative result must be combined with clinical observations, patient history, and epidemiological information.  Fact Sheet for Patients:   StrictlyIdeas.no  Fact Sheet for Healthcare Providers: BankingDealers.co.za  This test is not yet approved or  cleared by the [place] FDA and has been authorized for detection and/or diagnosis of SARS-CoV-2 by FDA under an Emergency Use Authorization (EUA).  This EUA will remain in effect (meaning this test can be used) for the duration of the COVID-19 declaration under Section 564(b)(1) of the Act, 21 U.S.C. section 360bbb-3(b)(1), unless the authorization is terminated or revoked sooner.  Performed at [facility], [address] [place], [place] [address]   Blood Culture (routine x 2)     Status: Abnormal   Collection Time: 06/16/20  8:32 AM   Specimen: BLOOD  Result Value Ref Range Status   Specimen Description   Final  BLOOD LEFT FORE ARM Performed at [facility], [address] [place], [place] [address]    Special Requests   Final    BOTTLES DRAWN AEROBIC AND ANAEROBIC Blood Culture results may not be optimal due to an inadequate volume of blood received in culture bottles Performed at [facility], [address], [place], [place] [address]    Culture  Setup Time   Final    GRAM POSITIVE COCCI IN BOTH AEROBIC AND ANAEROBIC BOTTLES CRITICAL VALUE NOTED.  VALUE IS CONSISTENT WITH PREVIOUSLY REPORTED AND CALLED VALUE. Performed at [facility], [address] [place], [place]  [address]    Culture (A)  Final    STREPTOCOCCUS PNEUMONIAE SUSCEPTIBILITIES PERFORMED ON PREVIOUS CULTURE WITHIN THE LAST 5 DAYS. Performed at [facility], [place] [address] [place], [place] [address]    Report Status 06/19/2020 FINAL  Final  Blood Culture (routine x 2)     Status: Abnormal   Collection Time: 06/16/20  8:32 AM   Specimen: BLOOD  Result Value Ref Range Status   Specimen Description   Final    BLOOD LEFT FORE ARM Performed at [facility], [address], [place], [place] [address]    Special Requests   Final    BOTTLES DRAWN AEROBIC AND ANAEROBIC Blood Culture results may not be optimal due to an inadequate volume of blood received in culture bottles Performed at [facility], [address] [place], [place] [address]    Culture  Setup Time   Final    Organism ID to follow [place] CRITICAL RESULT CALLED TO, READ BACK BY AND VERIFIED WITH: [place] 06/16/20 AT 2148 HS Performed at [facility], [place] [address], [place],  [address]    Culture STREPTOCOCCUS PNEUMONIAE (A)  Final   Report Status 06/19/2020 FINAL  Final   Organism ID, Bacteria STREPTOCOCCUS PNEUMONIAE  Final      Susceptibility   Streptococcus pneumoniae - MIC*    ERYTHROMYCIN >=8 RESISTANT Resistant     LEVOFLOXACIN 0.5 SENSITIVE Sensitive     VANCOMYCIN 0.5 SENSITIVE Sensitive     PENICILLIN (meningitis) 1 RESISTANT Resistant     PENO - penicillin 1      PENICILLIN (non-meningitis) 1 SENSITIVE Sensitive     PENICILLIN (oral) 1 INTERMEDIATE Intermediate     CEFTRIAXONE (non-meningitis) 1 SENSITIVE Sensitive     CEFTRIAXONE (meningitis) 1 INTERMEDIATE Intermediate     * STREPTOCOCCUS PNEUMONIAE  Blood Culture ID Panel (Reflexed)     Status: Abnormal   Collection Time: 06/16/20  8:32 AM  Result Value Ref Range Status   Enterococcus faecalis NOT DETECTED NOT DETECTED Final   Enterococcus Faecium NOT DETECTED  NOT DETECTED Final   Listeria monocytogenes NOT DETECTED NOT DETECTED Final   Staphylococcus species NOT DETECTED NOT DETECTED Final   Staphylococcus aureus (BCID) NOT DETECTED NOT DETECTED Final   Staphylococcus epidermidis NOT DETECTED NOT DETECTED Final   Staphylococcus lugdunensis NOT DETECTED NOT DETECTED Final   Streptococcus species DETECTED (A) NOT DETECTED Final    Comment: CRITICAL RESULT CALLED TO, READ BACK BY AND VERIFIED WITH: DAVID BESANTI 06/16/20 AT 2148 HS    Streptococcus agalactiae NOT DETECTED NOT DETECTED Final   Streptococcus pneumoniae DETECTED (A) NOT DETECTED Final    Comment: CRITICAL RESULT CALLED TO, READ BACK BY AND VERIFIED WITH: DAVID BESANTI 06/16/20 AT 2148 HS    Streptococcus pyogenes NOT DETECTED NOT  DETECTED Final   A.calcoaceticus-baumannii NOT DETECTED NOT DETECTED Final   Bacteroides fragilis NOT DETECTED NOT DETECTED Final   Enterobacterales NOT DETECTED NOT DETECTED Final   Enterobacter cloacae complex NOT DETECTED NOT DETECTED Final   Escherichia coli NOT DETECTED NOT DETECTED Final   Klebsiella aerogenes NOT DETECTED NOT DETECTED Final   Klebsiella oxytoca NOT DETECTED NOT DETECTED Final   Klebsiella pneumoniae NOT DETECTED NOT DETECTED Final   Proteus species NOT DETECTED NOT DETECTED Final   Salmonella species NOT DETECTED NOT DETECTED Final   Serratia marcescens NOT DETECTED NOT DETECTED Final   Haemophilus influenzae NOT DETECTED NOT DETECTED Final   Neisseria meningitidis NOT DETECTED NOT DETECTED Final   Pseudomonas aeruginosa NOT DETECTED NOT DETECTED Final   Stenotrophomonas maltophilia NOT DETECTED NOT DETECTED Final   Candida albicans NOT DETECTED NOT DETECTED Final   Candida auris NOT DETECTED NOT DETECTED Final   Candida glabrata NOT DETECTED NOT DETECTED Final   Candida krusei NOT DETECTED NOT DETECTED Final   Candida parapsilosis NOT DETECTED NOT DETECTED Final   Candida tropicalis NOT DETECTED NOT DETECTED Final    Cryptococcus neoformans/gattii NOT DETECTED NOT DETECTED Final    Comment: Performed at [facility], [address] [place], [place] [address]    Procedures and diagnostic studies:  No results found.             LOS: 7 days   Liese Dizdarevic  [facility]   Pager on [email or web address]. If 7PM-7AM, please contact night-coverage at [email or web address]     06/23/2020, 3:58 PM

## 2020-06-24 LAB — HEMOGLOBIN AND HEMATOCRIT, BLOOD
HCT: 21.7 % — ABNORMAL LOW (ref 39.0–52.0)
Hemoglobin: 7 g/dL — ABNORMAL LOW (ref 13.0–17.0)

## 2020-06-24 NOTE — Progress Notes (Signed)
Mobility Specialist - Progress Note   06/24/20 1132  Mobility  Activity Refused mobility  Mobility performed by Mobility specialist    Pt refused mobility this date reporting that he doesn't need it. Mobility encouraged pt for OOB activity, but pt responded "I can do all that on my own." Pt was seen ambulating to bathroom independently and reports no needs at this time.    Kathee Delton Mobility Specialist 06/24/20, 11:35 AM

## 2020-06-24 NOTE — Progress Notes (Addendum)
[facility]  ROUNDING NOTE   Subjective:   Patient denies any additional bleeding from his HD catheter.  Denies any shortness of breath or edema. Reports his appetite is improving.  States that he is planning on living in [place] for the next month before moving back to [place].   Objective:  Vital signs in last 24 hours:  Temp:  [98.6 F (37 C)-99 F (37.2 C)] 98.7 F (37.1 C) (09/19 1129) Pulse Rate:  [101-120] 109 (09/19 1129) Resp:  [15-20] 20 (09/19 1129) BP: (107-140)/(69-105) 123/85 (09/19 1129) SpO2:  [93 %-95 %] 95 % (09/19 1129)  Weight change:  Filed Weights   06/16/20 2228  Weight: 60.8 kg    Intake/Output: I/O last 3 completed shifts: In: 830 [P.O.:480; Blood:350] Out: 150 [Urine:150]   Intake/Output this shift:  No intake/output data recorded.  Physical Exam: General: NAD,   Head: Normocephalic, atraumatic. Moist oral mucosal membranes  Eyes: Anicteric, PERRL  Neck: Supple, trachea midline  Lungs:  Clear to auscultation  Heart: Regular rate and rhythm  Abdomen:  Soft, nontender,   Extremities:  No peripheral edema.  Neurologic: Nonfocal, moving all four extremities  Skin: No lesions  Access: RIJ permcath     Basic Metabolic Panel: Recent Labs  Lab 06/18/20 0655 06/18/20 0655 06/19/20 0621 06/20/20 0543 06/22/20 0447 06/23/20 0911  NA 132*  --  131* 133*  --  130*  K 4.5  --  4.3 4.0  --  4.1  CL 98  --  98 97*  --  92*  CO2 16*  --  16* 21*  --  27  GLUCOSE 102*  --  95 108*  --  111*  BUN 127*  --  116* 74*  --  40*  CREATININE 16.42*  --  14.31* 10.51*  --  7.66*  CALCIUM 7.7*   < > 7.2* 8.0*  --  7.7*  PHOS  --   --   --   --  8.0* 7.0*   < > = values in this interval not displayed.    Liver Function Tests: Recent Labs  Lab 06/23/20 0911  ALBUMIN 2.2*   No results for input(s): LIPASE, AMYLASE in the last 168 hours. No results for input(s): AMMONIA in the last 168 hours.  CBC: Recent Labs  Lab 06/18/20 0655  06/18/20 0655 06/19/20 0621 06/20/20 0543 06/22/20 2319 06/23/20 0911 06/24/20 0807  WBC 10.3  --  8.3 9.3  --   --   --   HGB 9.1*   < > 7.5* 9.3* 6.5* 7.2* 7.0*  HCT 26.0*   < > 22.4* 26.6* 20.4* 22.0* 21.7*  MCV 76.5*  --  79.7* 78.9*  --   --   --   PLT 180  --  158 220  --   --   --    < > = values in this interval not displayed.    Cardiac Enzymes: No results for input(s): CKTOTAL, CKMB, CKMBINDEX, TROPONINI in the last 168 hours.  BNP: Invalid input(s): POCBNP  CBG: No results for input(s): GLUCAP in the last 168 hours.  Microbiology: Results for orders placed or performed during the hospital encounter of 06/16/20  SARS Coronavirus 2 by RT PCR (hospital order, performed in [facility] hospital lab) Nasopharyngeal Nasopharyngeal Swab     Status: None   Collection Time: 06/16/20  8:32 AM   Specimen: Nasopharyngeal Swab  Result Value Ref Range Status   SARS Coronavirus 2 NEGATIVE NEGATIVE Final  Comment: (NOTE) SARS-CoV-2 target nucleic acids are NOT DETECTED.  The SARS-CoV-2 RNA is generally detectable in upper and lower respiratory specimens during the acute phase of infection. The lowest concentration of SARS-CoV-2 viral copies this assay can detect is 250 copies / mL. A negative result does not preclude SARS-CoV-2 infection and should not be used as the sole basis for treatment or other patient management decisions.  A negative result may occur with improper specimen collection / handling, submission of specimen other than nasopharyngeal swab, presence of viral mutation(s) within the areas targeted by this assay, and inadequate number of viral copies (<250 copies / mL). A negative result must be combined with clinical observations, patient history, and epidemiological information.  Fact Sheet for Patients:   StrictlyIdeas.no  Fact Sheet for Healthcare Providers: BankingDealers.co.za  This test is not yet  approved or  cleared by the [place] FDA and has been authorized for detection and/or diagnosis of SARS-CoV-2 by FDA under an Emergency Use Authorization (EUA).  This EUA will remain in effect (meaning this test can be used) for the duration of the COVID-19 declaration under Section 564(b)(1) of the Act, 21 U.S.C. section 360bbb-3(b)(1), unless the authorization is terminated or revoked sooner.  Performed at [facility], [address], [place], [place] [address]   Blood Culture (routine x 2)     Status: Abnormal   Collection Time: 06/16/20  8:32 AM   Specimen: BLOOD  Result Value Ref Range Status   Specimen Description   Final    BLOOD LEFT FORE ARM Performed at [facility], [address] [place], [place] [address]    Special Requests   Final    BOTTLES DRAWN AEROBIC AND ANAEROBIC Blood Culture results may not be optimal due to an inadequate volume of blood received in culture bottles Performed at [facility], [address] [place], [place] [address]    Culture  Setup Time   Final    GRAM POSITIVE COCCI IN BOTH AEROBIC AND ANAEROBIC BOTTLES CRITICAL VALUE NOTED.  VALUE IS CONSISTENT WITH PREVIOUSLY REPORTED AND CALLED VALUE. Performed at [facility], [address] [place], [place] [address]    Culture (A)  Final    STREPTOCOCCUS PNEUMONIAE SUSCEPTIBILITIES PERFORMED ON PREVIOUS CULTURE WITHIN THE LAST 5 DAYS. Performed at [facility], [place] [address], [place], [place] [address]    Report Status 06/19/2020 FINAL  Final  Blood Culture (routine x 2)     Status: Abnormal   Collection Time: 06/16/20  8:32 AM   Specimen: BLOOD  Result Value Ref Range Status   Specimen Description   Final    BLOOD LEFT FORE ARM Performed at [facility], [address] [place], [place] [address]    Special Requests   Final    BOTTLES DRAWN AEROBIC AND ANAEROBIC Blood Culture results may not be optimal due to an inadequate  volume of blood received in culture bottles Performed at [facility], [address] [place], [place] [address]    Culture  Setup Time   Final    Organism ID to follow [place] CRITICAL RESULT CALLED TO, READ BACK BY AND VERIFIED WITH: [place] 06/16/20 AT 2148 HS Performed at [facility], [place] [address], [place], [place] [address]    Culture STREPTOCOCCUS PNEUMONIAE (A)  Final   Report Status 06/19/2020 FINAL  Final   Organism ID, Bacteria STREPTOCOCCUS PNEUMONIAE  Final  Susceptibility   Streptococcus pneumoniae - MIC*    ERYTHROMYCIN >=8 RESISTANT Resistant     LEVOFLOXACIN 0.5 SENSITIVE Sensitive     VANCOMYCIN 0.5 SENSITIVE Sensitive     PENICILLIN (meningitis) 1 RESISTANT Resistant     PENO - penicillin 1      PENICILLIN (non-meningitis) 1 SENSITIVE Sensitive     PENICILLIN (oral) 1 INTERMEDIATE Intermediate     CEFTRIAXONE (non-meningitis) 1 SENSITIVE Sensitive     CEFTRIAXONE (meningitis) 1 INTERMEDIATE Intermediate     * STREPTOCOCCUS PNEUMONIAE  Blood Culture ID Panel (Reflexed)     Status: Abnormal   Collection Time: 06/16/20  8:32 AM  Result Value Ref Range Status   Enterococcus faecalis NOT DETECTED NOT DETECTED Final   Enterococcus Faecium NOT DETECTED NOT DETECTED Final   Listeria monocytogenes NOT DETECTED NOT DETECTED Final   Staphylococcus species NOT DETECTED NOT DETECTED Final   Staphylococcus aureus (BCID) NOT DETECTED NOT DETECTED Final   Staphylococcus epidermidis NOT DETECTED NOT DETECTED Final   Staphylococcus lugdunensis NOT DETECTED NOT DETECTED Final   Streptococcus species DETECTED (A) NOT DETECTED Final    Comment: CRITICAL RESULT CALLED TO, READ BACK BY AND VERIFIED WITH: DAVID BESANTI 06/16/20 AT 2148 HS    Streptococcus agalactiae NOT DETECTED NOT DETECTED Final   Streptococcus pneumoniae DETECTED (A) NOT DETECTED Final    Comment: CRITICAL RESULT CALLED TO, READ BACK  BY AND VERIFIED WITH: DAVID BESANTI 06/16/20 AT 2148 HS    Streptococcus pyogenes NOT DETECTED NOT DETECTED Final   A.calcoaceticus-baumannii NOT DETECTED NOT DETECTED Final   Bacteroides fragilis NOT DETECTED NOT DETECTED Final   Enterobacterales NOT DETECTED NOT DETECTED Final   Enterobacter cloacae complex NOT DETECTED NOT DETECTED Final   Escherichia coli NOT DETECTED NOT DETECTED Final   Klebsiella aerogenes NOT DETECTED NOT DETECTED Final   Klebsiella oxytoca NOT DETECTED NOT DETECTED Final   Klebsiella pneumoniae NOT DETECTED NOT DETECTED Final   Proteus species NOT DETECTED NOT DETECTED Final   Salmonella species NOT DETECTED NOT DETECTED Final   Serratia marcescens NOT DETECTED NOT DETECTED Final   Haemophilus influenzae NOT DETECTED NOT DETECTED Final   Neisseria meningitidis NOT DETECTED NOT DETECTED Final   Pseudomonas aeruginosa NOT DETECTED NOT DETECTED Final   Stenotrophomonas maltophilia NOT DETECTED NOT DETECTED Final   Candida albicans NOT DETECTED NOT DETECTED Final   Candida auris NOT DETECTED NOT DETECTED Final   Candida glabrata NOT DETECTED NOT DETECTED Final   Candida krusei NOT DETECTED NOT DETECTED Final   Candida parapsilosis NOT DETECTED NOT DETECTED Final   Candida tropicalis NOT DETECTED NOT DETECTED Final   Cryptococcus neoformans/gattii NOT DETECTED NOT DETECTED Final    Comment: Performed at [facility], [address] [place], [place] [address]    Coagulation Studies: Recent Labs    06/23/20 0032  LABPROT 13.9  INR 1.1    Urinalysis: No results for input(s): COLORURINE, LABSPEC, PHURINE, GLUCOSEU, HGBUR, BILIRUBINUR, KETONESUR, PROTEINUR, UROBILINOGEN, NITRITE, LEUKOCYTESUR in the last 72 hours.  Invalid input(s): APPERANCEUR    Imaging: No results found.   Medications:   . cefTRIAXone (ROCEPHIN)  IV 2 g (06/23/20 1320)   . sodium chloride   Intravenous Once  . amLODipine  10 mg Oral Daily  . aspirin EC  81 mg Oral  Daily  . carvedilol  25 mg Oral BID WC  . Chlorhexidine Gluconate Cloth  6 each Topical Q0600  . heparin  5,000 Units Subcutaneous Q8H  . hydrALAZINE  50 mg  Oral Q8H  . losartan  100 mg Oral Daily  . sulfamethoxazole-trimethoprim  1 tablet Oral Daily   acetaminophen, chlorpheniramine-HYDROcodone, hydrALAZINE, HYDROmorphone (DILAUDID) injection, labetalol, morphine injection, ondansetron (ZOFRAN) IV, oxyCODONE-acetaminophen, promethazine  Assessment/ Plan:  Mr. Daniel Combs is a 29 y.o.  male with a PMHx oflongstanding hypertension, ESRD not yet on dialysis, anemia of chronic kidney disease, secondary hyperparathyroidism,HIV,who was [facility] to [facility] on[facility]21for evaluation of shortness of breath and chest pain.   1. ESRDinitiated on dialysis this admission Patient was last seen by nephrologist in [place] approximately 1 year ago. 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 Kidney is atrophic on renal ultrasound. -Patient has completed three dialysis treatments on a M,W,F schedule.  Tolerating treatments well.  -Outpatient hemodialysis center placement at [place] - Vascular placed a stitch yesterdal to control the bleeding. No reports of bleeding since stitch was placed.  - obtain an updated BMP tomorrow morning.  - plan for HD tomorrow.   2. Hypertension. -Blood pressure overall improved as compared to admission. BP 123/85.  -Continue amlodipine, carvedilol, hydralazine, and losartan.  3. Anemia of chronic kidney disease. - Hemoglobin 7.0 - Avoid further blood transfusions if possible; due to possibility of a future transplant   4. Hyperkalemia. -Potassium corrected with dialysis treatments. - k  4.1     LOS: 8 Daniel Combs 9/19/202112:52 PM   Patient was seen and examined with Zonia Kief PA-C  Additionally patient's PTH is elevated at 654 consistent with secondary hyperparathyroidism of renal origin. Not taking vitamin D agents or phosphorus binders.  Above plan was discussed and agreed upon on the signing of this note.   Lavonia Dana, [facility]  9/19/20212:26 PM

## 2020-06-24 NOTE — Progress Notes (Signed)
Progress Note    Daniel Combs  RSW:[id number] DOB: 1991-07-17  DOA: 06/16/2020 PCP: Pcp, No      Brief Narrative:    Medical records reviewed and are as summarized below:  Daniel Combs is a 29 y.o. male       Assessment/Plan:   Principal Problem:   Sepsis ([employer]) Active Problems:   CAP (community acquired pneumonia)   Bacteremia due to Streptococcus pneumoniae   Hypertension   ESRD (end stage renal disease) ([place])   Hyponatremia   Hyperkalemia   Elevated troponin   Anemia in ESRD (end-stage renal disease) (HCC) Strep pneumo bacteremia-strep pneumo antigen positive and strep pneumo in blood HIV infection Immunocompromised state-CD4 count of 74 Acute blood loss anemia from bleeding around permacath site  Body mass index is 21.63 kg/m.    PLAN  Continue IV Rocephin Continue Bactrim for PCP prophylaxis Continue antihypertensives Follow-up with nephrologist for hemodialysis. Monitor H&H.  S/p transfusion with 1 unit of PRBCs on 06/17/2020 and 06/23/2020.    S/p treatment with IV Venofer on 06/19/2020 and 06/20/2020  Follow-up with case manager to assist with obtaining outpatient hemodialysis spot prior to discharge.   Diet Order            Diet renal/carb modified with fluid restriction Diet-HS Snack? Nothing; Fluid restriction: 1200 mL Fluid; Room service appropriate? Yes; Fluid consistency: Thin  Diet effective now                    Consultants:  Nephrologist  Infectious disease  Procedures:  Placement of tunneled hemodialysis catheter via right IJ vein on 06/18/2020  Hemodialysis  Area around the right upper chest wall permacath site was sutured (on 06/23/2020) to control bleeding.    Medications:   . sodium chloride   Intravenous Once  . amLODipine  10 mg Oral Daily  . aspirin EC  81 mg Oral Daily  . carvedilol  25 mg Oral BID WC  . Chlorhexidine Gluconate Cloth  6 each Topical Q0600  . heparin  5,000 Units Subcutaneous  Q8H  . hydrALAZINE  50 mg Oral Q8H  . losartan  100 mg Oral Daily  . sulfamethoxazole-trimethoprim  1 tablet Oral Daily   Continuous Infusions: . cefTRIAXone (ROCEPHIN)  IV 2 g (06/23/20 1320)     Anti-infectives (From admission, onward)   Start     Dose/Rate Route Frequency Ordered Stop   06/19/20 2000  sulfamethoxazole-trimethoprim (BACTRIM) 400-80 MG per tablet 1 tablet        1 tablet Oral Daily 06/19/20 1729     06/16/20 1130  cefTRIAXone (ROCEPHIN) 2 g in sodium chloride 0.9 % 100 mL IVPB        2 g 200 mL/hr over 30 Minutes Intravenous Every 24 hours 06/16/20 1123     06/16/20 1130  azithromycin (ZITHROMAX) 500 mg in sodium chloride 0.9 % 250 mL IVPB  Status:  Discontinued        500 mg 250 mL/hr over 60 Minutes Intravenous Every 24 hours 06/16/20 1123 06/18/20 2016             Family Communication/Anticipated D/C date and plan/Code Status   DVT prophylaxis: Place and maintain sequential compression device Start: 06/17/20 0741 heparin injection 5,000 Units Start: 06/16/20 1400     Code Status: Full Code  Family Communication: Plan discussed with patient Disposition Plan:    Status is: Inpatient  Remains inpatient appropriate because:IV treatments appropriate due to intensity of illness or inability  to take PO   Dispo: The patient is from: Home              Anticipated d/c is to: Home              Anticipated d/c date is: 3 days              Patient currently is not medically stable to d/c.  Awaiting final recommendations from ID.  Patient has to get an outpatient hemodialysis spot prior to discharge.           Subjective:   Interval events noted.  No acute issues overnight.  No complaints.  No bleeding from permacath site.  No shortness of breath, chest pain, dizziness or palpitations.  Objective:    Vitals:   06/23/20 1811 06/23/20 1943 06/24/20 0458 06/24/20 0947  BP: (!) 131/92 107/69 126/78 131/81  Pulse: (!) 109 (!) 101 (!) 109 (!)  120  Resp:  15 16   Temp:  99 F (37.2 C) 98.6 F (37 C)   TempSrc:  Oral Oral   SpO2:  94% 93%   Weight:      Height:       No data found.   Intake/Output Summary (Last 24 hours) at 06/24/2020 1122 Last data filed at 06/24/2020 0600 Gross per 24 hour  Intake 480 ml  Output --  Net 480 ml   Filed Weights   06/16/20 2228  Weight: 60.8 kg    Exam:  GEN: NAD SKIN: No rash.  Dressing on right upper chest wall permacath is clean and dry. EYES: Pale but anicteric ENT: MMM CV: RRR PULM: CTA B ABD: soft, ND, NT, +BS CNS: AAO x 3, non focal EXT: No edema or tenderness     Data Reviewed:   I have personally reviewed following labs and imaging studies:  Labs: Labs show the following:   Basic Metabolic Panel: Recent Labs  Lab 06/18/20 0655 06/18/20 0655 06/19/20 0621 06/19/20 0621 06/20/20 0543 06/22/20 0447 06/23/20 0911  NA 132*  --  131*  --  133*  --  130*  K 4.5   < > 4.3   < > 4.0  --  4.1  CL 98  --  98  --  97*  --  92*  CO2 16*  --  16*  --  21*  --  27  GLUCOSE 102*  --  95  --  108*  --  111*  BUN 127*  --  116*  --  74*  --  40*  CREATININE 16.42*  --  14.31*  --  10.51*  --  7.66*  CALCIUM 7.7*  --  7.2*  --  8.0*  --  7.7*  PHOS  --   --   --   --   --  8.0* 7.0*   < > = values in this interval not displayed.   GFR Estimated Creatinine Clearance: 12.1 mL/min (A) (by C-G formula based on SCr of 7.66 mg/dL (H)). Liver Function Tests: Recent Labs  Lab 06/23/20 0911  ALBUMIN 2.2*   No results for input(s): LIPASE, AMYLASE in the last 168 hours. No results for input(s): AMMONIA in the last 168 hours. Coagulation profile Recent Labs  Lab 06/23/20 0032  INR 1.1    CBC: Recent Labs  Lab 06/18/20 0655 06/18/20 0655 06/19/20 0621 06/20/20 0543 06/22/20 2319 06/23/20 0911 06/24/20 0807  WBC 10.3  --  8.3 9.3  --   --   --  HGB 9.1*   < > 7.5* 9.3* 6.5* 7.2* 7.0*  HCT 26.0*   < > 22.4* 26.6* 20.4* 22.0* 21.7*  MCV 76.5*  --  79.7*  78.9*  --   --   --   PLT 180  --  158 220  --   --   --    < > = values in this interval not displayed.   Cardiac Enzymes: No results for input(s): CKTOTAL, CKMB, CKMBINDEX, TROPONINI in the last 168 hours. BNP (last 3 results) No results for input(s): PROBNP in the last 8760 hours. CBG: No results for input(s): GLUCAP in the last 168 hours. D-Dimer: No results for input(s): DDIMER in the last 72 hours. Hgb A1c: No results for input(s): HGBA1C in the last 72 hours. Lipid Profile: No results for input(s): CHOL, HDL, LDLCALC, TRIG, CHOLHDL, LDLDIRECT in the last 72 hours. Thyroid function studies: No results for input(s): TSH, T4TOTAL, T3FREE, THYROIDAB in the last 72 hours.  Invalid input(s): FREET3 Anemia work up: No results for input(s): VITAMINB12, FOLATE, FERRITIN, TIBC, IRON, RETICCTPCT in the last 72 hours. Sepsis Labs: Recent Labs  Lab 06/18/20 0655 06/19/20 0621 06/20/20 0543  WBC 10.3 8.3 9.3    Microbiology Recent Results (from the past 240 hour(s))  SARS Coronavirus 2 by RT PCR (hospital order, performed in [facility] hospital lab) Nasopharyngeal Nasopharyngeal Swab     Status: None   Collection Time: 06/16/20  8:32 AM   Specimen: Nasopharyngeal Swab  Result Value Ref Range Status   SARS Coronavirus 2 NEGATIVE NEGATIVE Final    Comment: (NOTE) SARS-CoV-2 target nucleic acids are NOT DETECTED.  The SARS-CoV-2 RNA is generally detectable in upper and lower respiratory specimens during the acute phase of infection. The lowest concentration of SARS-CoV-2 viral copies this assay can detect is 250 copies / mL. A negative result does not preclude SARS-CoV-2 infection and should not be used as the sole basis for treatment or other patient management decisions.  A negative result may occur with improper specimen collection / handling, submission of specimen other than nasopharyngeal swab, presence of viral mutation(s) within the areas targeted by this assay, and  inadequate number of viral copies (<250 copies / mL). A negative result must be combined with clinical observations, patient history, and epidemiological information.  Fact Sheet for Patients:   StrictlyIdeas.no  Fact Sheet for Healthcare Providers: BankingDealers.co.za  This test is not yet approved or  cleared by the [place] FDA and has been authorized for detection and/or diagnosis of SARS-CoV-2 by FDA under an Emergency Use Authorization (EUA).  This EUA will remain in effect (meaning this test can be used) for the duration of the COVID-19 declaration under Section 564(b)(1) of the Act, 21 U.S.C. section 360bbb-3(b)(1), unless the authorization is terminated or revoked sooner.  Performed at [facility], [address] [place], [place] [address]   Blood Culture (routine x 2)     Status: Abnormal   Collection Time: 06/16/20  8:32 AM   Specimen: BLOOD  Result Value Ref Range Status   Specimen Description   Final    BLOOD LEFT FORE ARM Performed at [facility], [address] [place], [place] [address]    Special Requests   Final    BOTTLES DRAWN AEROBIC AND ANAEROBIC Blood Culture results may not be optimal due to an inadequate volume of blood received in culture bottles Performed at [facility], [address] [place], [place] [address]    Culture  Setup Time   Final    GRAM POSITIVE COCCI IN BOTH AEROBIC AND ANAEROBIC BOTTLES CRITICAL VALUE NOTED.  VALUE IS CONSISTENT WITH PREVIOUSLY REPORTED AND CALLED VALUE. Performed at [facility], [address] [place], [place] [address]    Culture (A)  Final    STREPTOCOCCUS PNEUMONIAE SUSCEPTIBILITIES PERFORMED ON PREVIOUS CULTURE WITHIN THE LAST 5 DAYS. Performed at [facility], [place] [address], [place], [place] [address]    Report Status 06/19/2020 FINAL  Final  Blood Culture (routine x 2)     Status: Abnormal    Collection Time: 06/16/20  8:32 AM   Specimen: BLOOD  Result Value Ref Range Status   Specimen Description   Final    BLOOD LEFT FORE ARM Performed at [facility], [address], [place], [place] [address]    Special Requests   Final    BOTTLES DRAWN AEROBIC AND ANAEROBIC Blood Culture results may not be optimal due to an inadequate volume of blood received in culture bottles Performed at [facility], [address] [place], [place] [address]    Culture  Setup Time   Final    Organism ID to follow [place] CRITICAL RESULT CALLED TO, READ BACK BY AND VERIFIED WITH: Maceo 06/16/20 AT 2148 HS Performed at [facility], [place] [address], [place], [place] [address]    Culture STREPTOCOCCUS PNEUMONIAE (A)  Final   Report Status 06/19/2020 FINAL  Final   Organism ID, Bacteria STREPTOCOCCUS PNEUMONIAE  Final      Susceptibility   Streptococcus pneumoniae - MIC*    ERYTHROMYCIN >=8 RESISTANT Resistant     LEVOFLOXACIN 0.5 SENSITIVE Sensitive     VANCOMYCIN 0.5 SENSITIVE Sensitive     PENICILLIN (meningitis) 1 RESISTANT Resistant     PENO - penicillin 1      PENICILLIN (non-meningitis) 1 SENSITIVE Sensitive     PENICILLIN (oral) 1 INTERMEDIATE Intermediate     CEFTRIAXONE (non-meningitis) 1 SENSITIVE Sensitive     CEFTRIAXONE (meningitis) 1 INTERMEDIATE Intermediate     * STREPTOCOCCUS PNEUMONIAE  Blood Culture ID Panel (Reflexed)     Status: Abnormal   Collection Time: 06/16/20  8:32 AM  Result Value Ref Range Status   Enterococcus faecalis NOT DETECTED NOT DETECTED Final   Enterococcus Faecium NOT DETECTED NOT DETECTED Final   Listeria monocytogenes NOT DETECTED NOT DETECTED Final   Staphylococcus species NOT DETECTED NOT DETECTED Final   Staphylococcus aureus (BCID) NOT DETECTED NOT DETECTED Final   Staphylococcus epidermidis NOT DETECTED NOT DETECTED Final   Staphylococcus lugdunensis NOT DETECTED  NOT DETECTED Final   Streptococcus species DETECTED (A) NOT DETECTED Final    Comment: CRITICAL RESULT CALLED TO, READ BACK BY AND VERIFIED WITH: DAVID BESANTI 06/16/20 AT 2148 HS    Streptococcus agalactiae NOT DETECTED NOT DETECTED Final   Streptococcus pneumoniae DETECTED (A) NOT DETECTED Final    Comment: CRITICAL RESULT CALLED TO, READ BACK BY AND VERIFIED WITH: DAVID BESANTI 06/16/20 AT 2148 HS    Streptococcus pyogenes NOT DETECTED NOT DETECTED Final   A.calcoaceticus-baumannii NOT DETECTED NOT DETECTED Final   Bacteroides fragilis NOT DETECTED NOT DETECTED Final   Enterobacterales NOT DETECTED NOT DETECTED Final   Enterobacter cloacae complex NOT DETECTED NOT DETECTED Final   Escherichia coli NOT DETECTED NOT DETECTED Final   Klebsiella aerogenes NOT DETECTED NOT DETECTED Final   Klebsiella oxytoca NOT DETECTED NOT DETECTED Final   Klebsiella  pneumoniae NOT DETECTED NOT DETECTED Final   Proteus species NOT DETECTED NOT DETECTED Final   Salmonella species NOT DETECTED NOT DETECTED Final   Serratia marcescens NOT DETECTED NOT DETECTED Final   Haemophilus influenzae NOT DETECTED NOT DETECTED Final   Neisseria meningitidis NOT DETECTED NOT DETECTED Final   Pseudomonas aeruginosa NOT DETECTED NOT DETECTED Final   Stenotrophomonas maltophilia NOT DETECTED NOT DETECTED Final   Candida albicans NOT DETECTED NOT DETECTED Final   Candida auris NOT DETECTED NOT DETECTED Final   Candida glabrata NOT DETECTED NOT DETECTED Final   Candida krusei NOT DETECTED NOT DETECTED Final   Candida parapsilosis NOT DETECTED NOT DETECTED Final   Candida tropicalis NOT DETECTED NOT DETECTED Final   Cryptococcus neoformans/gattii NOT DETECTED NOT DETECTED Final    Comment: Performed at [facility], [address] [place], [place] [address]    Procedures and diagnostic studies:  No results found.             LOS: 8 days   Robie Mcniel  [facility]   Pager on  [email or web address]. If 7PM-7AM, please contact night-coverage at [email or web address]     06/24/2020, 11:22 AM

## 2020-06-25 ENCOUNTER — Inpatient Hospital Stay: Payer: Medicaid Other

## 2020-06-25 LAB — BASIC METABOLIC PANEL
Anion gap: 16 — ABNORMAL HIGH (ref 5–15)
BUN: 78 mg/dL — ABNORMAL HIGH (ref 6–20)
CO2: 21 mmol/L — ABNORMAL LOW (ref 22–32)
Calcium: 7.7 mg/dL — ABNORMAL LOW (ref 8.9–10.3)
Chloride: 90 mmol/L — ABNORMAL LOW (ref 98–111)
Creatinine, Ser: 12.1 mg/dL — ABNORMAL HIGH (ref 0.61–1.24)
GFR calc Af Amer: 6 mL/min — ABNORMAL LOW (ref >60)
GFR calc non Af Amer: 5 mL/min — ABNORMAL LOW (ref >60)
Glucose, Bld: 93 mg/dL (ref 70–99)
Potassium: 4.7 mmol/L (ref 3.5–5.1)
Sodium: 127 mmol/L — ABNORMAL LOW (ref 135–145)

## 2020-06-25 LAB — CBC WITH DIFFERENTIAL/PLATELET
Abs Immature Granulocytes: 0.09 10*3/uL — ABNORMAL HIGH (ref 0.00–0.07)
Basophils Absolute: 0 10*3/uL (ref 0.0–0.1)
Basophils Relative: 0 %
Eosinophils Absolute: 0.2 10*3/uL (ref 0.0–0.5)
Eosinophils Relative: 2 %
HCT: 21.4 % — ABNORMAL LOW (ref 39.0–52.0)
Hemoglobin: 7 g/dL — ABNORMAL LOW (ref 13.0–17.0)
Immature Granulocytes: 1 %
Lymphocytes Relative: 5 %
Lymphs Abs: 0.7 10*3/uL (ref 0.7–4.0)
MCH: 27.3 pg (ref 26.0–34.0)
MCHC: 32.7 g/dL (ref 30.0–36.0)
MCV: 83.6 fL (ref 80.0–100.0)
Monocytes Absolute: 2.1 10*3/uL — ABNORMAL HIGH (ref 0.1–1.0)
Monocytes Relative: 16 %
Neutro Abs: 9.9 10*3/uL — ABNORMAL HIGH (ref 1.7–7.7)
Neutrophils Relative %: 76 %
Platelets: 345 10*3/uL (ref 150–400)
RBC: 2.56 MIL/uL — ABNORMAL LOW (ref 4.22–5.81)
RDW: 14.5 % (ref 11.5–15.5)
WBC: 13 10*3/uL — ABNORMAL HIGH (ref 4.0–10.5)
nRBC: 0 % (ref 0.0–0.2)

## 2020-06-25 LAB — PREPARE RBC (CROSSMATCH)

## 2020-06-25 LAB — LACTIC ACID, PLASMA: Lactic Acid, Venous: 0.8 mmol/L (ref 0.5–1.9)

## 2020-06-25 LAB — TROPONIN I (HIGH SENSITIVITY)
Troponin I (High Sensitivity): 133 ng/L (ref <18)
Troponin I (High Sensitivity): 170 ng/L (ref <18)

## 2020-06-25 LAB — PROCALCITONIN: Procalcitonin: 11.77 ng/mL

## 2020-06-25 MED ORDER — SODIUM CHLORIDE 0.9% IV SOLUTION: Freq: Once | INTRAVENOUS | Status: AC

## 2020-06-25 MED ORDER — IOHEXOL 350 MG/ML SOLN
75.0000 mL | Freq: Once | INTRAVENOUS | Status: AC | PRN
  Administered 2020-06-25: 75 mL via INTRAVENOUS

## 2020-06-25 MED ORDER — EPOETIN ALFA 10000 UNIT/ML IJ SOLN
10000.0000 [IU] | INTRAMUSCULAR | Status: DC
  Administered 2020-06-25: 10000 [IU] via INTRAVENOUS

## 2020-06-25 MED ORDER — SODIUM CHLORIDE 0.9 % IV SOLN
1.0000 g | INTRAVENOUS | Status: DC
  Administered 2020-06-25 – 2020-06-28 (×4): 1 g via INTRAVENOUS
  Filled 2020-06-25 (×5): qty 1

## 2020-06-25 MED ORDER — SODIUM CHLORIDE 0.9% IV SOLUTION: Freq: Once | INTRAVENOUS | Status: DC

## 2020-06-25 MED ORDER — VANCOMYCIN HCL IN DEXTROSE 500-5 MG/100ML-% IV SOLN
500.0000 mg | INTRAVENOUS | Status: DC
  Filled 2020-06-25: qty 100

## 2020-06-25 MED ORDER — VANCOMYCIN HCL 1250 MG/250ML IV SOLN
1250.0000 mg | Freq: Once | INTRAVENOUS | Status: AC
  Administered 2020-06-25: 1250 mg via INTRAVENOUS
  Filled 2020-06-25: qty 250

## 2020-06-25 MED ORDER — SODIUM CHLORIDE 0.9 % IV SOLN
INTRAVENOUS | Status: DC | PRN
  Administered 2020-06-25 – 2020-07-01 (×4): 250 mL via INTRAVENOUS

## 2020-06-25 MED ORDER — DARUN-COBIC-EMTRICIT-TENOFAF 800-150-200-10 MG PO TABS
1.0000 | ORAL_TABLET | Freq: Every day | ORAL | Status: DC
  Administered 2020-06-25 – 2020-07-06 (×12): 1 via ORAL
  Filled 2020-06-25: qty 8
  Filled 2020-06-25 (×15): qty 1

## 2020-06-25 NOTE — Progress Notes (Addendum)
Progress Note    Daniel Combs  HQI:[id number] DOB: April 06, 1991  DOA: 06/16/2020 PCP: Pcp, No      Brief Narrative:    Medical records reviewed and are as summarized below:  Daniel Combs is a 29 y.o. male       Assessment/Plan:   Principal Problem:   Sepsis ([place]) Active Problems:   CAP (community acquired pneumonia)   Bacteremia due to Streptococcus pneumoniae   Hypertension   ESRD (end stage renal disease) ([place])   Hyponatremia   Hyperkalemia   Elevated troponin   Anemia in ESRD (end-stage renal disease) ([facility]) Strep pneumo bacteremia-strep pneumo antigen positive and strep pneumo in blood HIV infection Immunocompromised state-CD4 count of 74 Acute blood loss anemia from bleeding around permacath site  Body mass index is 21.63 kg/m.    PLAN  Continue IV Rocephin.  Plan to complete 10 days of treatment per ID. Continue Bactrim for PCP prophylaxis Continue antihypertensives Follow-up with nephrologist for hemodialysis. Conservative approach has been adopted with respect to blood transfusion because of possibility of future transplant.  Continue to monitor H&H. S/p transfusion with 1 unit of PRBCs on 06/17/2020 and 06/23/2020.    S/p treatment with IV Venofer on 06/19/2020 and 06/20/2020.  Patient has a dialysis spot at outpatient dialysis center at [facility] dialysis center on [address] but insurance authorization is still pending.    Addendum  Patient complained of chest pain later today.  EKG showed sinus tachycardia.  Initial troponin is 133 which is around his baseline.  1 unit of packed red blood cells to be transfused and hopefully this will help with chest pain and tachycardia.  Plan to transfuse was discussed with nephrologist, Dr. Juleen China.   Addendum  I was notified that patient had developed a productive cough and fever with temperature of 103.  He is still tachycardic on telemetry.  Differential diagnosis includes  sepsis, complicated pneumonia, pulmonary embolism.  Patient will be started on IV fluids, IV vancomycin and cefepime.  Blood cultures, lactic acid, procalcitonin, chest x-ray and CTA of the chest has been ordered.  Plan of care was discussed with the patient via phone and he agreed with the plan.  Case was discussed with ID specialist as well. Case was also discussed with nephrologist, Dr. Juleen China, who gave approval for contrast to be used with CT chest. I spoke to Dr. Nyoka Cowden, radiologist, to explain why patient needs the test. He was okay with the plan.     Diet Order            Diet renal/carb modified with fluid restriction Diet-HS Snack? Nothing; Fluid restriction: 1200 mL Fluid; Room service appropriate? Yes; Fluid consistency: Thin  Diet effective now                    Consultants:  Nephrologist  Infectious disease  Procedures:  Placement of tunneled hemodialysis catheter via right IJ vein on 06/18/2020  Hemodialysis  Area around the right upper chest wall permacath site was sutured (on 06/23/2020) to control bleeding.    Medications:   . amLODipine  10 mg Oral Daily  . aspirin EC  81 mg Oral Daily  . carvedilol  25 mg Oral BID WC  . Chlorhexidine Gluconate Cloth  6 each Topical Q0600  . epoetin (EPOGEN/PROCRIT) injection  10,000 Units Intravenous Q M,W,F-HD  . heparin  5,000 Units Subcutaneous Q8H  . hydrALAZINE  50 mg Oral Q8H  . losartan  100  mg Oral Daily  . sulfamethoxazole-trimethoprim  1 tablet Oral Daily   Continuous Infusions: . cefTRIAXone (ROCEPHIN)  IV 2 g (06/24/20 1328)     Anti-infectives (From admission, onward)   Start     Dose/Rate Route Frequency Ordered Stop   06/19/20 2000  sulfamethoxazole-trimethoprim (BACTRIM) 400-80 MG per tablet 1 tablet        1 tablet Oral Daily 06/19/20 1729     06/16/20 1130  cefTRIAXone (ROCEPHIN) 2 g in sodium chloride 0.9 % 100 mL IVPB        2 g 200 mL/hr over 30 Minutes Intravenous Every 24 hours 06/16/20  1123     06/16/20 1130  azithromycin (ZITHROMAX) 500 mg in sodium chloride 0.9 % 250 mL IVPB  Status:  Discontinued        500 mg 250 mL/hr over 60 Minutes Intravenous Every 24 hours 06/16/20 1123 06/18/20 2016             Family Communication/Anticipated D/C date and plan/Code Status   DVT prophylaxis: Place and maintain sequential compression device Start: 06/17/20 0741 heparin injection 5,000 Units Start: 06/16/20 1400     Code Status: Full Code  Family Communication: Plan discussed with patient Disposition Plan:    Status is: Inpatient  Remains inpatient appropriate because:IV treatments appropriate due to intensity of illness or inability to take PO   Dispo: The patient is from: Home              Anticipated d/c is to: Home              Anticipated d/c date is: 3 days              Patient currently is not medically stable to d/c.  Awaiting final recommendations from ID.  Patient has to get an outpatient hemodialysis spot prior to discharge.           Subjective:   No acute issues overnight.  No shortness of breath, dizziness, palpitations or chest pain.  Objective:    Vitals:   06/24/20 2022 06/25/20 0513 06/25/20 0930 06/25/20 0945  BP: 112/70 110/78 (!) 130/99 129/89  Pulse: 95 (!) 107    Resp: 19 16  18   Temp: 98.4 F (36.9 C) 98.8 F (37.1 C) 99.1 F (37.3 C)   TempSrc: Oral Oral    SpO2: 97% 95%    Weight:      Height:       No data found.   Intake/Output Summary (Last 24 hours) at 06/25/2020 1218 Last data filed at 06/25/2020 0500 Gross per 24 hour  Intake 480 ml  Output --  Net 480 ml   Filed Weights   06/16/20 2228  Weight: 60.8 kg    Exam:  GEN: NAD SKIN: No rash.  Permacath on right upper chest wall.  No bleeding noted from PermCath insertion site. EYES: EOMI ENT: MMM CV: RRR PULM: CTA B ABD: soft, ND, NT, +BS CNS: AAO x 3, non focal EXT: No edema or tenderness      Data Reviewed:   I have personally  reviewed following labs and imaging studies:  Labs: Labs show the following:   Basic Metabolic Panel: Recent Labs  Lab 06/19/20 0621 06/19/20 0621 06/20/20 0543 06/20/20 0543 06/22/20 0447 06/23/20 0911 06/25/20 0812  NA 131*  --  133*  --   --  130* 127*  K 4.3   < > 4.0   < >  --  4.1 4.7  CL  98  --  97*  --   --  92* 90*  CO2 16*  --  21*  --   --  27 21*  GLUCOSE 95  --  108*  --   --  111* 93  BUN 116*  --  74*  --   --  40* 78*  CREATININE 14.31*  --  10.51*  --   --  7.66* 12.10*  CALCIUM 7.2*  --  8.0*  --   --  7.7* 7.7*  PHOS  --   --   --   --  8.0* 7.0*  --    < > = values in this interval not displayed.   GFR Estimated Creatinine Clearance: 7.7 mL/min (A) (by C-G formula based on SCr of 12.1 mg/dL (H)). Liver Function Tests: Recent Labs  Lab 06/23/20 0911  ALBUMIN 2.2*   No results for input(s): LIPASE, AMYLASE in the last 168 hours. No results for input(s): AMMONIA in the last 168 hours. Coagulation profile Recent Labs  Lab 06/23/20 0032  INR 1.1    CBC: Recent Labs  Lab 06/19/20 0621 06/19/20 0621 06/20/20 0543 06/22/20 2319 06/23/20 0911 06/24/20 0807 06/25/20 0812  WBC 8.3  --  9.3  --   --   --  13.0*  NEUTROABS  --   --   --   --   --   --  9.9*  HGB 7.5*   < > 9.3* 6.5* 7.2* 7.0* 7.0*  HCT 22.4*   < > 26.6* 20.4* 22.0* 21.7* 21.4*  MCV 79.7*  --  78.9*  --   --   --  83.6  PLT 158  --  220  --   --   --  345   < > = values in this interval not displayed.   Cardiac Enzymes: No results for input(s): CKTOTAL, CKMB, CKMBINDEX, TROPONINI in the last 168 hours. BNP (last 3 results) No results for input(s): PROBNP in the last 8760 hours. CBG: No results for input(s): GLUCAP in the last 168 hours. D-Dimer: No results for input(s): DDIMER in the last 72 hours. Hgb A1c: No results for input(s): HGBA1C in the last 72 hours. Lipid Profile: No results for input(s): CHOL, HDL, LDLCALC, TRIG, CHOLHDL, LDLDIRECT in the last 72  hours. Thyroid function studies: No results for input(s): TSH, T4TOTAL, T3FREE, THYROIDAB in the last 72 hours.  Invalid input(s): FREET3 Anemia work up: No results for input(s): VITAMINB12, FOLATE, FERRITIN, TIBC, IRON, RETICCTPCT in the last 72 hours. Sepsis Labs: Recent Labs  Lab 06/19/20 0621 06/20/20 0543 06/25/20 0812  WBC 8.3 9.3 13.0*    Microbiology Recent Results (from the past 240 hour(s))  SARS Coronavirus 2 by RT PCR (hospital order, performed in [facility] hospital lab) Nasopharyngeal Nasopharyngeal Swab     Status: None   Collection Time: 06/16/20  8:32 AM   Specimen: Nasopharyngeal Swab  Result Value Ref Range Status   SARS Coronavirus 2 NEGATIVE NEGATIVE Final    Comment: (NOTE) SARS-CoV-2 target nucleic acids are NOT DETECTED.  The SARS-CoV-2 RNA is generally detectable in upper and lower respiratory specimens during the acute phase of infection. The lowest concentration of SARS-CoV-2 viral copies this assay can detect is 250 copies / mL. A negative result does not preclude SARS-CoV-2 infection and should not be used as the sole basis for treatment or other patient management decisions.  A negative result may occur with improper specimen collection / handling, submission of specimen other than  nasopharyngeal swab, presence of viral mutation(s) within the areas targeted by this assay, and inadequate number of viral copies (<250 copies / mL). A negative result must be combined with clinical observations, patient history, and epidemiological information.  Fact Sheet for Patients:   StrictlyIdeas.no  Fact Sheet for Healthcare Providers: BankingDealers.co.za  This test is not yet approved or  cleared by the [place] FDA and has been authorized for detection and/or diagnosis of SARS-CoV-2 by FDA under an Emergency Use Authorization (EUA).  This EUA will remain in effect (meaning this test can be used) for  the duration of the COVID-19 declaration under Section 564(b)(1) of the Act, 21 U.S.C. section 360bbb-3(b)(1), unless the authorization is terminated or revoked sooner.  Performed at [facility], [address], [place], [place] [address]   Blood Culture (routine x 2)     Status: Abnormal   Collection Time: 06/16/20  8:32 AM   Specimen: BLOOD  Result Value Ref Range Status   Specimen Description   Final    BLOOD LEFT FORE ARM Performed at [facility], [address], [place], [place] [address]    Special Requests   Final    BOTTLES DRAWN AEROBIC AND ANAEROBIC Blood Culture results may not be optimal due to an inadequate volume of blood received in culture bottles Performed at [facility], [address] [place], [place] [address]    Culture  Setup Time   Final    GRAM POSITIVE COCCI IN BOTH AEROBIC AND ANAEROBIC BOTTLES CRITICAL VALUE NOTED.  VALUE IS CONSISTENT WITH PREVIOUSLY REPORTED AND CALLED VALUE. Performed at [facility], [address] [place], [place] [address]    Culture (A)  Final    STREPTOCOCCUS PNEUMONIAE SUSCEPTIBILITIES PERFORMED ON PREVIOUS CULTURE WITHIN THE LAST 5 DAYS. Performed at [facility], [place] [address] [place], [place] [address]    Report Status 06/19/2020 FINAL  Final  Blood Culture (routine x 2)     Status: Abnormal   Collection Time: 06/16/20  8:32 AM   Specimen: BLOOD  Result Value Ref Range Status   Specimen Description   Final    BLOOD LEFT FORE ARM Performed at [facility], [address] [place], [place] [address]    Special Requests   Final    BOTTLES DRAWN AEROBIC AND ANAEROBIC Blood Culture results may not be optimal due to an inadequate volume of blood received in culture bottles Performed at [facility], [address] [place], [place] [address]    Culture  Setup Time   Final    Organism ID to follow [place] CRITICAL RESULT CALLED TO, READ BACK BY AND VERIFIED WITH: [place] 06/16/20 AT 2148 HS Performed at [facility], [place] [address], [place], [place] [address]    Culture STREPTOCOCCUS PNEUMONIAE (A)  Final   Report Status 06/19/2020 FINAL  Final   Organism ID, Bacteria STREPTOCOCCUS PNEUMONIAE  Final      Susceptibility   Streptococcus pneumoniae - MIC*    ERYTHROMYCIN >=8 RESISTANT Resistant     LEVOFLOXACIN 0.5 SENSITIVE Sensitive     VANCOMYCIN 0.5 SENSITIVE Sensitive     PENICILLIN (meningitis) 1 RESISTANT Resistant     PENO - penicillin 1      PENICILLIN (non-meningitis) 1 SENSITIVE Sensitive     PENICILLIN (oral) 1 INTERMEDIATE Intermediate     CEFTRIAXONE (non-meningitis) 1 SENSITIVE Sensitive     CEFTRIAXONE (  meningitis) 1 INTERMEDIATE Intermediate     * STREPTOCOCCUS PNEUMONIAE  Blood Culture ID Panel (Reflexed)     Status: Abnormal   Collection Time: 06/16/20  8:32 AM  Result Value Ref Range Status   Enterococcus faecalis NOT DETECTED NOT DETECTED Final   Enterococcus Faecium NOT DETECTED NOT DETECTED Final   Listeria monocytogenes NOT DETECTED NOT DETECTED Final   Staphylococcus species NOT DETECTED NOT DETECTED Final   Staphylococcus aureus (BCID) NOT DETECTED NOT DETECTED Final   Staphylococcus epidermidis NOT DETECTED NOT DETECTED Final   Staphylococcus lugdunensis NOT DETECTED NOT DETECTED Final   Streptococcus species DETECTED (A) NOT DETECTED Final    Comment: CRITICAL RESULT CALLED TO, READ BACK BY AND VERIFIED WITH: DAVID BESANTI 06/16/20 AT 2148 HS    Streptococcus agalactiae NOT DETECTED NOT DETECTED Final   Streptococcus pneumoniae DETECTED (A) NOT DETECTED Final    Comment: CRITICAL RESULT CALLED TO, READ BACK BY AND VERIFIED WITH: DAVID BESANTI 06/16/20 AT 2148 HS    Streptococcus pyogenes NOT DETECTED NOT DETECTED Final   A.calcoaceticus-baumannii NOT DETECTED NOT DETECTED Final   Bacteroides fragilis NOT DETECTED NOT DETECTED Final    Enterobacterales NOT DETECTED NOT DETECTED Final   Enterobacter cloacae complex NOT DETECTED NOT DETECTED Final   Escherichia coli NOT DETECTED NOT DETECTED Final   Klebsiella aerogenes NOT DETECTED NOT DETECTED Final   Klebsiella oxytoca NOT DETECTED NOT DETECTED Final   Klebsiella pneumoniae NOT DETECTED NOT DETECTED Final   Proteus species NOT DETECTED NOT DETECTED Final   Salmonella species NOT DETECTED NOT DETECTED Final   Serratia marcescens NOT DETECTED NOT DETECTED Final   Haemophilus influenzae NOT DETECTED NOT DETECTED Final   Neisseria meningitidis NOT DETECTED NOT DETECTED Final   Pseudomonas aeruginosa NOT DETECTED NOT DETECTED Final   Stenotrophomonas maltophilia NOT DETECTED NOT DETECTED Final   Candida albicans NOT DETECTED NOT DETECTED Final   Candida auris NOT DETECTED NOT DETECTED Final   Candida glabrata NOT DETECTED NOT DETECTED Final   Candida krusei NOT DETECTED NOT DETECTED Final   Candida parapsilosis NOT DETECTED NOT DETECTED Final   Candida tropicalis NOT DETECTED NOT DETECTED Final   Cryptococcus neoformans/gattii NOT DETECTED NOT DETECTED Final    Comment: Performed at [facility], [address] [place], [place] [address]    Procedures and diagnostic studies:  No results found.             LOS: 9 days   Inetta Dicke  [employer], advertising on [email or web address]. If 7PM-7AM, please contact night-coverage at [email or web address]     06/25/2020, 12:18 PM

## 2020-06-25 NOTE — Progress Notes (Signed)
Patient refusing to have second IV placed for radiology. Patient was educated on the need for a second IV and the  Best practices for use. Patient still refused to have IV placed. Radiology was notified of patient's refusal of IV. Daniel Combs

## 2020-06-25 NOTE — Consult Note (Signed)
Pharmacy Antibiotic Note  Daniel Combs is a 29 y.o. male admitted on 06/16/2020 with sepsis.  Pharmacy has been consulted for Cefepime/Vancomycin dosing. Patient has a PMH significant for ESRD (MWF while here per nehprology notes).   Patient is currently on CTX for bacteremia.   Plan: 1. Vancomycin 1250 mg x1 dose after dialysis today. Will order Vancomycin 500 mg on dialysis days (after dialysis). Will order a Vancomycin level after 3rd dose.   2. Cefepime 1g Q24H.   3. Verbal for CTX discontinuation.     Height: 5\' 6"  (167.6 cm) Weight: 60.8 kg (134 lb) IBW/kg (Calculated) : 63.8  Temp (24hrs), Avg:99.4 F (37.4 C), Min:97.6 F (36.4 C), Max:103 F (39.4 C)  Recent Labs  Lab 06/19/20 0621 06/20/20 0543 06/23/20 0911 06/25/20 0812  WBC 8.3 9.3  --  13.0*  CREATININE 14.31* 10.51* 7.66* 12.10*    Estimated Creatinine Clearance: 7.7 mL/min (A) (by C-G formula based on SCr of 12.1 mg/dL (H)).    No Known Allergies  Antimicrobials this admission:   Dose adjustments this admission:   Microbiology results: BCx:   UCx:  Sputum:   MRSA PCR:  Thank you for allowing pharmacy to be a part of this patient's care.  Rowland Lathe 06/25/2020 7:04 PM

## 2020-06-25 NOTE — Progress Notes (Signed)
Patient is planning to stay in [place] for about a month, referral sent to [facility] on Thursday 9/16. Due to patient having [place] Medicaid, Out of Network clearance will need approval for patient to treat within [place]. Clinic is also working on getting transportation set up, as patient stated that he has no way to get to treatments. Still waiting on finical clearance.Once cleared patient will have a MWF 6:30am chair time.

## 2020-06-25 NOTE — Progress Notes (Signed)
ID Pt feeling better Cough better No fever Some chest pain due to coughing  Patient Vitals for the past 24 hrs:  BP Temp Temp src Pulse Resp SpO2  06/25/20 1809 118/77 -- -- (!) 119 -- --  06/25/20 1353 139/90 97.6 F (36.4 C) Oral (!) 118 18 95 %  06/25/20 1300 132/69 -- -- -- -- --  06/25/20 1245 125/89 -- -- -- -- --  06/25/20 0945 129/89 -- -- -- 18 --  06/25/20 0930 (!) 130/99 99.1 F (37.3 C) -- -- -- --  06/25/20 0513 110/78 98.8 F (37.1 C) Oral (!) 107 16 95 %  06/24/20 2022 112/70 98.4 F (36.9 C) Oral 95 19 97 %   Awake and alert Chest b/l air entry [id number] tachycardia abd soft Rt HD catheter  Labs CBC Latest Ref Rng & Units 06/25/2020 06/24/2020 06/23/2020  WBC 4.0 - 10.5 K/uL 13.0(H) - -  Hemoglobin 13.0 - 17.0 g/dL 7.0(L) 7.0(L) 7.2(L)  Hematocrit 39 - 52 % 21.4(L) 21.7(L) 22.0(L)  Platelets 150 - 400 K/uL 345 - -    CMP Latest Ref Rng & Units 06/25/2020 06/23/2020 06/20/2020  Glucose 70 - 99 mg/dL 93 111(H) 108(H)  BUN 6 - 20 mg/dL 78(H) 40(H) 74(H)  Creatinine 0.61 - 1.24 mg/dL 12.10(H) 7.66(H) 10.51(H)  Sodium 135 - 145 mmol/L 127(L) 130(L) 133(L)  Potassium 3.5 - 5.1 mmol/L 4.7 4.1 4.0  Chloride 98 - 111 mmol/L 90(L) 92(L) 97(L)  CO2 22 - 32 mmol/L 21(L) 27 21(L)  Calcium 8.9 - 10.3 mg/dL 7.7(L) 7.7(L) 8.0(L)  Total Protein 6.5 - 8.1 g/dL - - -  Total Bilirubin 0.3 - 1.2 mg/dL - - -  Alkaline Phos 38 - 126 U/L - - -  AST 15 - 41 U/L - - -  ALT 0 - 44 U/L - - -    Impression/recommendation  Strep pneumoniae bacteremia due to left lung pneumonia- continue ceftriaxone until  06/28/20  AIDS- non compliant- last cd4 is 64 as Triumeq cannot be given in ESRD-, changed to Symtuza  Uncontrolled HTN- on amlodipine, carvedilol, hydralazine and losartan  ESRD- started dialysis this admission Anemia  Discussed the management with patient and care team

## 2020-06-25 NOTE — Progress Notes (Addendum)
[facility]  ROUNDING NOTE   Subjective:   Patient receiving dialysis today.Denies SOB,nausea or vomiting, appears pleasant and comfortable.No further bleeding from dialysis access site.  Objective:  Vital signs in last 24 hours:  Temp:  [97.6 F (36.4 C)-99.1 F (37.3 C)] 97.6 F (36.4 C) (09/20 1353) Pulse Rate:  [95-118] 118 (09/20 1353) Resp:  [16-19] 18 (09/20 1353) BP: (110-139)/(69-99) 139/90 (09/20 1353) SpO2:  [95 %-97 %] 95 % (09/20 1353)  Weight change:  Filed Weights   06/16/20 2228  Weight: 60.8 kg    Intake/Output: I/O last 3 completed shifts: In: 38 [P.O.:960] Out: -    Intake/Output this shift:  Total I/O In: -  Out: 2000 [Other:2000]  Physical Exam: General: Resting in bed, receiving dialysis treatment  Head:  Moist oral mucosal membranes moist  Eyes: Sclerae and conjunctivae clear  Neck: Supple, trachea midline  Lungs:  Clear to auscultation  Heart: S1S2 Regular rate and rhythm  Abdomen:  Soft, nontender,   Extremities:  No peripheral edema.  Neurologic: Alert,oriented x3  Skin: No acute rashes or lesions  Access: RIJ permcath     Basic Metabolic Panel: Recent Labs  Lab 06/19/20 0621 06/19/20 0621 06/20/20 0543 06/22/20 0447 06/23/20 0911 06/25/20 0812  NA 131*  --  133*  --  130* 127*  K 4.3  --  4.0  --  4.1 4.7  CL 98  --  97*  --  92* 90*  CO2 16*  --  21*  --  27 21*  GLUCOSE 95  --  108*  --  111* 93  BUN 116*  --  74*  --  40* 78*  CREATININE 14.31*  --  10.51*  --  7.66* 12.10*  CALCIUM 7.2*   < > 8.0*  --  7.7* 7.7*  PHOS  --   --   --  8.0* 7.0*  --    < > = values in this interval not displayed.    Liver Function Tests: Recent Labs  Lab 06/23/20 0911  ALBUMIN 2.2*   No results for input(s): LIPASE, AMYLASE in the last 168 hours. No results for input(s): AMMONIA in the last 168 hours.  CBC: Recent Labs  Lab 06/19/20 0621 06/19/20 0621 06/20/20 0543 06/22/20 2319 06/23/20 0911 06/24/20 0807  06/25/20 0812  WBC 8.3  --  9.3  --   --   --  13.0*  NEUTROABS  --   --   --   --   --   --  9.9*  HGB 7.5*   < > 9.3* 6.5* 7.2* 7.0* 7.0*  HCT 22.4*   < > 26.6* 20.4* 22.0* 21.7* 21.4*  MCV 79.7*  --  78.9*  --   --   --  83.6  PLT 158  --  220  --   --   --  345   < > = values in this interval not displayed.    Cardiac Enzymes: No results for input(s): CKTOTAL, CKMB, CKMBINDEX, TROPONINI in the last 168 hours.  BNP: Invalid input(s): POCBNP  CBG: No results for input(s): GLUCAP in the last 168 hours.  Microbiology: Results for orders placed or performed during the hospital encounter of 06/16/20  SARS Coronavirus 2 by RT PCR (hospital order, performed in [facility] hospital lab) Nasopharyngeal Nasopharyngeal Swab     Status: None   Collection Time: 06/16/20  8:32 AM   Specimen: Nasopharyngeal Swab  Result Value Ref Range Status   SARS  Coronavirus 2 NEGATIVE NEGATIVE Final    Comment: (NOTE) SARS-CoV-2 target nucleic acids are NOT DETECTED.  The SARS-CoV-2 RNA is generally detectable in upper and lower respiratory specimens during the acute phase of infection. The lowest concentration of SARS-CoV-2 viral copies this assay can detect is 250 copies / mL. A negative result does not preclude SARS-CoV-2 infection and should not be used as the sole basis for treatment or other patient management decisions.  A negative result may occur with improper specimen collection / handling, submission of specimen other than nasopharyngeal swab, presence of viral mutation(s) within the areas targeted by this assay, and inadequate number of viral copies (<250 copies / mL). A negative result must be combined with clinical observations, patient history, and epidemiological information.  Fact Sheet for Patients:   StrictlyIdeas.no  Fact Sheet for Healthcare Providers: BankingDealers.co.za  This test is not yet approved or  cleared by the  [place] FDA and has been authorized for detection and/or diagnosis of SARS-CoV-2 by FDA under an Emergency Use Authorization (EUA).  This EUA will remain in effect (meaning this test can be used) for the duration of the COVID-19 declaration under Section 564(b)(1) of the Act, 21 U.S.C. section 360bbb-3(b)(1), unless the authorization is terminated or revoked sooner.  Performed at [facility], [address] [place], [place] [address]   Blood Culture (routine x 2)     Status: Abnormal   Collection Time: 06/16/20  8:32 AM   Specimen: BLOOD  Result Value Ref Range Status   Specimen Description   Final    BLOOD LEFT FORE ARM Performed at [facility], [address] [place], [place] [address]    Special Requests   Final    BOTTLES DRAWN AEROBIC AND ANAEROBIC Blood Culture results may not be optimal due to an inadequate volume of blood received in culture bottles Performed at [facility], [address], [place], [place] [address]    Culture  Setup Time   Final    GRAM POSITIVE COCCI IN BOTH AEROBIC AND ANAEROBIC BOTTLES CRITICAL VALUE NOTED.  VALUE IS CONSISTENT WITH PREVIOUSLY REPORTED AND CALLED VALUE. Performed at [facility], [address] [place], [place] [address]    Culture (A)  Final    STREPTOCOCCUS PNEUMONIAE SUSCEPTIBILITIES PERFORMED ON PREVIOUS CULTURE WITHIN THE LAST 5 DAYS. Performed at [facility], [place] [address], [place], [place] [address]    Report Status 06/19/2020 FINAL  Final  Blood Culture (routine x 2)     Status: Abnormal   Collection Time: 06/16/20  8:32 AM   Specimen: BLOOD  Result Value Ref Range Status   Specimen Description   Final    BLOOD LEFT FORE ARM Performed at [facility], [address], [place], [place] [address]    Special Requests   Final    BOTTLES DRAWN AEROBIC AND ANAEROBIC Blood Culture results may not be optimal due to an inadequate volume of blood received in  culture bottles Performed at [facility], [address] [place], [place] [address]    Culture  Setup Time   Final    Organism ID to follow [facility] CRITICAL RESULT CALLED TO, READ BACK BY AND VERIFIED WITH: [place] 06/16/20 AT 2148 HS Performed at [facility], [place] [address], [place],  [address]    Culture STREPTOCOCCUS PNEUMONIAE (A)  Final   Report Status 06/19/2020 FINAL  Final  Organism ID, Bacteria STREPTOCOCCUS PNEUMONIAE  Final      Susceptibility   Streptococcus pneumoniae - MIC*    ERYTHROMYCIN >=8 RESISTANT Resistant     LEVOFLOXACIN 0.5 SENSITIVE Sensitive     VANCOMYCIN 0.5 SENSITIVE Sensitive     PENICILLIN (meningitis) 1 RESISTANT Resistant     PENO - penicillin 1      PENICILLIN (non-meningitis) 1 SENSITIVE Sensitive     PENICILLIN (oral) 1 INTERMEDIATE Intermediate     CEFTRIAXONE (non-meningitis) 1 SENSITIVE Sensitive     CEFTRIAXONE (meningitis) 1 INTERMEDIATE Intermediate     * STREPTOCOCCUS PNEUMONIAE  Blood Culture ID Panel (Reflexed)     Status: Abnormal   Collection Time: 06/16/20  8:32 AM  Result Value Ref Range Status   Enterococcus faecalis NOT DETECTED NOT DETECTED Final   Enterococcus Faecium NOT DETECTED NOT DETECTED Final   Listeria monocytogenes NOT DETECTED NOT DETECTED Final   Staphylococcus species NOT DETECTED NOT DETECTED Final   Staphylococcus aureus (BCID) NOT DETECTED NOT DETECTED Final   Staphylococcus epidermidis NOT DETECTED NOT DETECTED Final   Staphylococcus lugdunensis NOT DETECTED NOT DETECTED Final   Streptococcus species DETECTED (A) NOT DETECTED Final    Comment: CRITICAL RESULT CALLED TO, READ BACK BY AND VERIFIED WITH: DAVID BESANTI 06/16/20 AT 2148 HS    Streptococcus agalactiae NOT DETECTED NOT DETECTED Final   Streptococcus pneumoniae DETECTED (A) NOT DETECTED Final    Comment: CRITICAL RESULT CALLED TO, READ BACK BY AND VERIFIED WITH: DAVID  BESANTI 06/16/20 AT 2148 HS    Streptococcus pyogenes NOT DETECTED NOT DETECTED Final   A.calcoaceticus-baumannii NOT DETECTED NOT DETECTED Final   Bacteroides fragilis NOT DETECTED NOT DETECTED Final   Enterobacterales NOT DETECTED NOT DETECTED Final   Enterobacter cloacae complex NOT DETECTED NOT DETECTED Final   Escherichia coli NOT DETECTED NOT DETECTED Final   Klebsiella aerogenes NOT DETECTED NOT DETECTED Final   Klebsiella oxytoca NOT DETECTED NOT DETECTED Final   Klebsiella pneumoniae NOT DETECTED NOT DETECTED Final   Proteus species NOT DETECTED NOT DETECTED Final   Salmonella species NOT DETECTED NOT DETECTED Final   Serratia marcescens NOT DETECTED NOT DETECTED Final   Haemophilus influenzae NOT DETECTED NOT DETECTED Final   Neisseria meningitidis NOT DETECTED NOT DETECTED Final   Pseudomonas aeruginosa NOT DETECTED NOT DETECTED Final   Stenotrophomonas maltophilia NOT DETECTED NOT DETECTED Final   Candida albicans NOT DETECTED NOT DETECTED Final   Candida auris NOT DETECTED NOT DETECTED Final   Candida glabrata NOT DETECTED NOT DETECTED Final   Candida krusei NOT DETECTED NOT DETECTED Final   Candida parapsilosis NOT DETECTED NOT DETECTED Final   Candida tropicalis NOT DETECTED NOT DETECTED Final   Cryptococcus neoformans/gattii NOT DETECTED NOT DETECTED Final    Comment: Performed at [facility], [address] [place], [place] [address]    Coagulation Studies: Recent Labs    06/23/20 0032  LABPROT 13.9  INR 1.1    Urinalysis: No results for input(s): COLORURINE, LABSPEC, PHURINE, GLUCOSEU, HGBUR, BILIRUBINUR, KETONESUR, PROTEINUR, UROBILINOGEN, NITRITE, LEUKOCYTESUR in the last 72 hours.  Invalid input(s): APPERANCEUR    Imaging: No results found.   Medications:   . cefTRIAXone (ROCEPHIN)  IV 2 g (06/24/20 1328)   . amLODipine  10 mg Oral Daily  . aspirin EC  81 mg Oral Daily  . carvedilol  25 mg Oral BID WC  . Chlorhexidine Gluconate  Cloth  6 each Topical Q0600  . Darunavir-Cobicisctat-Emtricitabine-Tenofovir Alafenamide  1 tablet Oral Q supper  .  heparin  5,000 Units Subcutaneous Q8H  . hydrALAZINE  50 mg Oral Q8H  . losartan  100 mg Oral Daily  . sulfamethoxazole-trimethoprim  1 tablet Oral Daily   acetaminophen, chlorpheniramine-HYDROcodone, hydrALAZINE, HYDROmorphone (DILAUDID) injection, labetalol, morphine injection, ondansetron (ZOFRAN) IV, oxyCODONE-acetaminophen, promethazine  Assessment/ Plan:  Mr. Daniel Combs is a 29 y.o.  male with a PMHx oflongstanding hypertension, ESRD not yet on dialysis, anemia of chronic kidney disease, secondary hyperparathyroidism,HIV,who was admitted to [facility] on9/11/2021for evaluation of shortness of breath and chest pain.   1. ESRDinitiated on dialysis this admission Patient was last seen by nephrologist in [place] approximately 1 year ago. 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 Kidney is atrophic on renal ultrasound.  - Patient getting dialysis today, will continue MWF schedule  -Outpatient hemodialysis center placement at [place] - No active bleeding from Rt IJ catheter site    2. Hypertension. -Blood pressure slightly elevated for age -Continue amlodipine, carvedilol, hydralazine, and losartan.  3. Anemia of chronic kidney disease. - Hemoglobin staying around  7.0 - Avoid further blood transfusions if possible; due to possibility of a future transplant  -Will continue monitoring CBCs  4. Hyperkalemia. K+ 4.7 today Will continue monitoring      LOS: 9 Princy Raju 9/20/20212:00 PM  Patient was seen and examined with Crosby Oyster, DNP. Above plan was discussed and agreed upon on the  signing of this note.   Lavonia Dana, MD [facility]  9/20/20213:32 PM

## 2020-06-25 NOTE — Progress Notes (Signed)
Consult received. This nurse explained to patient why he was needing a new PIV for his CT. He refused PIV placement. This nurse notified the patient's nurse Jonelle Sidle RN. Instructed nurse to notify [facility] with any other needs or concerns. RN VU. Fran Lowes, RN [facility]

## 2020-06-26 ENCOUNTER — Inpatient Hospital Stay: Payer: Medicaid Other

## 2020-06-26 DIAGNOSIS — J90 Pleural effusion, not elsewhere classified: Secondary | ICD-10-CM

## 2020-06-26 LAB — CBC WITH DIFFERENTIAL/PLATELET
Abs Immature Granulocytes: 0.08 10*3/uL — ABNORMAL HIGH (ref 0.00–0.07)
Basophils Absolute: 0 10*3/uL (ref 0.0–0.1)
Basophils Relative: 0 %
Eosinophils Absolute: 0.1 10*3/uL (ref 0.0–0.5)
Eosinophils Relative: 1 %
HCT: 23.4 % — ABNORMAL LOW (ref 39.0–52.0)
Hemoglobin: 7.7 g/dL — ABNORMAL LOW (ref 13.0–17.0)
Immature Granulocytes: 1 %
Lymphocytes Relative: 6 %
Lymphs Abs: 0.6 10*3/uL — ABNORMAL LOW (ref 0.7–4.0)
MCH: 27.7 pg (ref 26.0–34.0)
MCHC: 32.9 g/dL (ref 30.0–36.0)
MCV: 84.2 fL (ref 80.0–100.0)
Monocytes Absolute: 1.9 10*3/uL — ABNORMAL HIGH (ref 0.1–1.0)
Monocytes Relative: 17 %
Neutro Abs: 8.4 10*3/uL — ABNORMAL HIGH (ref 1.7–7.7)
Neutrophils Relative %: 75 %
Platelets: 372 10*3/uL (ref 150–400)
RBC: 2.78 MIL/uL — ABNORMAL LOW (ref 4.22–5.81)
RDW: 14.4 % (ref 11.5–15.5)
WBC: 11.1 10*3/uL — ABNORMAL HIGH (ref 4.0–10.5)
nRBC: 0 % (ref 0.0–0.2)

## 2020-06-26 LAB — COMPREHENSIVE METABOLIC PANEL
ALT: 29 U/L (ref 0–44)
AST: 32 U/L (ref 15–41)
Albumin: 2.1 g/dL — ABNORMAL LOW (ref 3.5–5.0)
Alkaline Phosphatase: 70 U/L (ref 38–126)
Anion gap: 13 (ref 5–15)
BUN: 46 mg/dL — ABNORMAL HIGH (ref 6–20)
CO2: 24 mmol/L (ref 22–32)
Calcium: 7.8 mg/dL — ABNORMAL LOW (ref 8.9–10.3)
Chloride: 94 mmol/L — ABNORMAL LOW (ref 98–111)
Creatinine, Ser: 8.61 mg/dL — ABNORMAL HIGH (ref 0.61–1.24)
GFR calc Af Amer: 9 mL/min — ABNORMAL LOW (ref >60)
GFR calc non Af Amer: 7 mL/min — ABNORMAL LOW (ref >60)
Glucose, Bld: 153 mg/dL — ABNORMAL HIGH (ref 70–99)
Potassium: 4.2 mmol/L (ref 3.5–5.1)
Sodium: 131 mmol/L — ABNORMAL LOW (ref 135–145)
Total Bilirubin: 0.6 mg/dL (ref 0.3–1.2)
Total Protein: 7.1 g/dL (ref 6.5–8.1)

## 2020-06-26 LAB — TYPE AND SCREEN
ABO/RH(D): A POS
Antibody Screen: NEGATIVE

## 2020-06-26 LAB — HEPATITIS C ANTIBODY: HCV Ab: NONREACTIVE

## 2020-06-26 LAB — PROCALCITONIN: Procalcitonin: 9.18 ng/mL

## 2020-06-26 LAB — RPR
RPR Ser Ql: REACTIVE — AB
RPR Titer: 1:1 {titer}

## 2020-06-26 LAB — LACTATE DEHYDROGENASE: LDH: 137 U/L (ref 98–192)

## 2020-06-26 MED ORDER — RENA-VITE PO TABS
1.0000 | ORAL_TABLET | Freq: Every day | ORAL | Status: DC
  Administered 2020-06-26 – 2020-07-05 (×9): 1 via ORAL
  Filled 2020-06-26 (×10): qty 1

## 2020-06-26 MED ORDER — GUAIFENESIN 100 MG/5ML PO SOLN
15.0000 mL | ORAL | Status: DC | PRN
  Filled 2020-06-26: qty 15

## 2020-06-26 MED ORDER — NEPRO/CARBSTEADY PO LIQD
237.0000 mL | Freq: Two times a day (BID) | ORAL | Status: DC
  Administered 2020-06-28 – 2020-07-02 (×4): 237 mL via ORAL

## 2020-06-26 NOTE — Progress Notes (Signed)
Patient ID: Daniel Combs, male   DOB: [birth year], 29 y.o.   MRN: [id number]  Chief Complaint  Patient presents with  . Chest Pain  . Cough  . Back Pain    Referred By Dr. Mal Misty Reason for Referral right complicated pleural effusion  HPI Location, Quality, Duration, Severity, Timing, Context, Modifying Factors, Associated Signs and Symptoms.  Daniel Combs is a 29 y.o. male.  He is visiting some family here in [place] but resides in [place].  He states that about a year ago he was told he needed to be on dialysis but because of the Covid situation multiple appointments were missed and ultimately came here to visit.  He states that his hypertension is the cause of his renal failure.  He also has HIV which has been poorly managed as well.  He presented here with increasing shortness of breath and had a chest x-ray which showed a left upper lobe pneumonic process.  He was begun on broad-spectrum antibiotics.  Work-up for his renal failure then continued and he ultimately underwent insertion of a right subclavian PermCath.  He had a chest x-ray made yesterday which revealed a complicated right pleural effusion and a subsequent CT scan confirmed the presence of a multiloculated right pleural effusion as well as bilateral pneumonic processes consistent with pneumonia.  The patient states that he is not really been short of breath.  However he has been relatively inactive.  He is able to do whatever he would like to do at home.  He does not complain of any limitations.  He does smoke.  He does not smoke or use IV drugs.   Past Medical History:  Diagnosis Date  . Hypertension     Past Surgical History:  Procedure Laterality Date  . DIALYSIS/PERMA CATHETER INSERTION N/A 06/18/2020   Procedure: DIALYSIS/PERMA CATHETER INSERTION;  Surgeon: Algernon Huxley, MD;  Location: [facility];  Service: Cardiovascular;  Laterality: N/A;    Family History  Problem Relation Age of Onset  .  Diabetes Mellitus II Mother   . Hypertension Mother     Social History Social History   Tobacco Use  . Smoking status: Current Every Day Smoker  . Smokeless tobacco: Never Used  Substance Use Topics  . Alcohol use: Not Currently  . Drug use: Never    No Known Allergies  Current Facility-Administered Medications  Medication Dose Route Frequency Provider Last Rate Last Admin  . 0.9 %  sodium chloride infusion   Intravenous PRN Jennye Boroughs, MD   Stopped at 06/26/20 0316  . acetaminophen (TYLENOL) tablet 650 mg  650 mg Oral Q6H PRN Algernon Huxley, MD   650 mg at 06/25/20 1844  . amLODipine (NORVASC) tablet 10 mg  10 mg Oral Daily Algernon Huxley, MD   10 mg at 06/26/20 1029  . aspirin EC tablet 81 mg  81 mg Oral Daily Algernon Huxley, MD   81 mg at 06/26/20 1029  . carvedilol (COREG) tablet 25 mg  25 mg Oral BID WC Lateef, Munsoor, MD   25 mg at 06/26/20 1029  . ceFEPIme (MAXIPIME) 1 g in sodium chloride 0.9 % 100 mL IVPB  1 g Intravenous Q24H Jennye Boroughs, MD   Stopped at 06/25/20 2108  . Chlorhexidine Gluconate Cloth 2 % PADS 6 each  6 each Topical Q0600 Algernon Huxley, MD   6 each at 06/25/20 0515  . chlorpheniramine-HYDROcodone (TUSSIONEX) 10-8 MG/5ML suspension 5 mL  5 mL Oral  QHS PRN Algernon Huxley, MD   5 mL at 06/24/20 2023  . Darunavir-Cobicisctat-Emtricitabine-Tenofovir Alafenamide (SYMTUZA) 800-150-200-10 MG TABS 1 tablet  1 tablet Oral Q supper Tsosie Billing, MD   1 tablet at 06/25/20 1809  . heparin injection 5,000 Units  5,000 Units Subcutaneous Q8H Algernon Huxley, MD   5,000 Units at 06/25/20 2135  . hydrALAZINE (APRESOLINE) injection 10 mg  10 mg Intravenous Q4H PRN Algernon Huxley, MD   10 mg at 06/21/20 0538  . hydrALAZINE (APRESOLINE) tablet 50 mg  50 mg Oral Q8H Algernon Huxley, MD   50 mg at 06/25/20 1407  . HYDROmorphone (DILAUDID) injection 1 mg  1 mg Intravenous Once PRN Algernon Huxley, MD      . labetalol (NORMODYNE) injection 10 mg  10 mg Intravenous Q4H PRN Wyvonnia Dusky, MD   10 mg at 06/22/20 0345  . losartan (COZAAR) tablet 100 mg  100 mg Oral Daily Lateef, Munsoor, MD   100 mg at 06/26/20 1029  . morphine 2 MG/ML injection 1 mg  1 mg Intravenous Q3H PRN Algernon Huxley, MD   1 mg at 06/25/20 1611  . ondansetron (ZOFRAN) injection 4 mg  4 mg Intravenous Q6H PRN Algernon Huxley, MD   4 mg at 06/21/20 1906  . oxyCODONE-acetaminophen (PERCOCET) 7.5-325 MG per tablet 1 tablet  1 tablet Oral Q6H PRN Algernon Huxley, MD   1 tablet at 06/24/20 2022  . promethazine (PHENERGAN) injection 12.5 mg  12.5 mg Intravenous Q6H PRN Algernon Huxley, MD      . sulfamethoxazole-trimethoprim (BACTRIM) 400-80 MG per tablet 1 tablet  1 tablet Oral Daily Tsosie Billing, MD   1 tablet at 06/26/20 1028  . [START ON 06/27/2020] vancomycin (VANCOCIN) IVPB 500 mg/100 ml premix  500 mg Intravenous Q M,W,F-HD Jennye Boroughs, MD          Review of Systems A complete review of systems was asked and was negative except for the following positive findings recent fever and shortness of breath prompting hospital admission  Blood pressure 117/80, pulse (!) 103, temperature 98.8 F (37.1 C), resp. rate 16, height 5\' 6"  (1.676 m), weight 60.8 kg, SpO2 95 %.  Physical Exam CONSTITUTIONAL:  Pleasant, well-developed, well-nourished, and in no acute distress. EYES: Pupils equal and reactive to light, Sclera non-icteric EARS, NOSE, MOUTH AND THROAT:  The oropharynx was clear.  Dentition is good repair.  Oral mucosa pink and moist. LYMPH NODES:  Lymph nodes in the neck and axillae were normal RESPIRATORY:  Lungs were clear on the left and diminished on the right.  Normal respiratory effort without pathologic use of accessory muscles of respiration CARDIOVASCULAR: Heart was regular without murmurs.  There were no carotid bruits. GI: The abdomen was soft, nontender, and nondistended. There were no palpable masses. There was no hepatosplenomegaly. There were normal bowel sounds in all  quadrants. GU:  Rectal deferred.   MUSCULOSKELETAL:  Normal muscle strength and tone.  No clubbing or cyanosis.   SKIN:  There were no pathologic skin lesions.  There were no nodules on palpation. NEUROLOGIC:  Sensation is normal.  Cranial nerves are grossly intact. PSYCH:  Oriented to person, place and time.  Mood and affect are normal.  Data Reviewed Chest x-ray and CT scan  I have personally reviewed the patient's imaging, laboratory findings and medical records.    Assessment    I have independently reviewed the patient's CT scan and chest x-rays.  I reviewed those with the patient as well.  I explained to him that I believe that the most likely etiology is a parapneumonic effusion secondary to bilateral pneumonias.  I explained to him the indications and risks of percutaneous drainage with intrapleural thrombolytics.  He understands the rationale for doing this.  He also understands the largest risk being that of bleeding.    Plan    After an extensive discussion with the patient he is somewhat hesitant to have this performed.  I did explain to him that the only other option was surgical intervention which certainly would be a much more invasive procedure that may or may not be needed ultimately.  I explained to him that because he is currently eating that it would be a while until the radiologist could perform this procedure and that he would have an opportunity to think about his options.  He understands.  We will continue to follow with you.    Nestor Lewandowsky, MD 06/26/2020, 10:50 AM   Patient ID: Lovena Le, male   DOB: 03/25/1991, 29 y.o.   MRN: [id number]

## 2020-06-26 NOTE — Progress Notes (Signed)
Patient refused nursing to take 1200 VS. Patient stated he just wanted to rest.   Fuller Mandril, RN

## 2020-06-26 NOTE — Progress Notes (Signed)
ID  Fever of 103 yesterday Has rt sided chest pain Patient Vitals for the past 24 hrs:  BP Temp Temp src Pulse Resp SpO2  06/26/20 1633 114/77 98.6 F (37 C) Oral 93 18 96 %  06/26/20 1026 117/80 98.8 F (37.1 C) -- (!) 103 -- 95 %  06/26/20 0321 127/86 100.3 F (37.9 C) Oral (!) 111 16 96 %  06/26/20 0056 111/74 (!) 100.8 F (38.2 C) Oral (!) 106 16 95 %  06/26/20 0056 111/74 (!) 100.8 F (38.2 C) -- (!) 106 16 --  06/26/20 0026 110/72 (!) 100.4 F (38 C) Oral (!) 102 16 95 %  06/25/20 2227 107/78 97.8 F (36.6 C) Oral 95 17 98 %  06/25/20 2130 104/65 98.5 F (36.9 C) Oral 96 16 96 %  06/25/20 2034 101/67 99.1 F (37.3 C) Oral 95 15 98 %   Chest decreased air entry rt Hss1s2 tachycadria Abd soft Rt HD catheter   Labs CBC Latest Ref Rng & Units 06/26/2020 06/25/2020 06/24/2020  WBC 4.0 - 10.5 K/uL 11.1(H) 13.0(H) -  Hemoglobin 13.0 - 17.0 g/dL 7.7(L) 7.0(L) 7.0(L)  Hematocrit 39 - 52 % 23.4(L) 21.4(L) 21.7(L)  Platelets 150 - 400 K/uL 372 345 -    CMP Latest Ref Rng & Units 06/26/2020 06/25/2020 06/23/2020  Glucose 70 - 99 mg/dL 153(H) 93 111(H)  BUN 6 - 20 mg/dL 46(H) 78(H) 40(H)  Creatinine 0.61 - 1.24 mg/dL 8.61(H) 12.10(H) 7.66(H)  Sodium 135 - 145 mmol/L 131(L) 127(L) 130(L)  Potassium 3.5 - 5.1 mmol/L 4.2 4.7 4.1  Chloride 98 - 111 mmol/L 94(L) 90(L) 92(L)  CO2 22 - 32 mmol/L 24 21(L) 27  Calcium 8.9 - 10.3 mg/dL 7.8(L) 7.7(L) 7.7(L)  Total Protein 6.5 - 8.1 g/dL 7.1 - -  Total Bilirubin 0.3 - 1.2 mg/dL 0.6 - -  Alkaline Phos 38 - 126 U/L 70 - -  AST 15 - 41 U/L 32 - -  ALT 0 - 44 U/L 29 - -    Imaging    Impression/recommendation  Strep pneumo bacteremia Pneumonia and now has rt loculated pleural effusion which is likely empyema- he needs chest drain Ceftriaxone has been changed to vanco and cefepime yesterday  Accelerated HTN on 4 meds  CKD- stage V- started dialysis this admission  AIDS- started symtuza yesterday- on bactrim for PCP  prophylaxis  Discussed with patient

## 2020-06-26 NOTE — Care Management (Signed)
Patient not medically ready for discharge.  Per HD coordinator outpatient HD has been established.   HD center has been unable to set up transportation  [facility] met with patient at bedside. He is very vague with answers.  States that "no one at my [employer] Confirms that he has access to [employer] stop at his mother's house.  TOC attempting to confirm if HD center is near a [employer] bus stop  TOC inquired about patient using an uber or taxi.  States "I don't have a way to pay with that."  Patient states " I wish you people would stop coming in my room" and asked me to leave.

## 2020-06-26 NOTE — Progress Notes (Signed)
Patient agitated and rude to this RN because he states he " is tired of these nurses coming in here every five minutes to take his vital signs", patient was educated on the fact that he was in the hospital because he is sick and needs medical care. Patient also educated that nursing care is 24 hrs a day and that we are giving him the best possible care. Patient verbalizes " I don't care, I am tired and don't want to be bothered with you people". Patient is receiving 1 unit of blood and that process was explained to the patient as well as the Mew[facility] protocol because his mews score had been elevated. Daniel Combs

## 2020-06-26 NOTE — Progress Notes (Signed)
[facility]  ROUNDING NOTE   Subjective:   Hemodialysis treatment yesterday. Tolerated treatment well. UF 2 liters  PRBC transfusion 1 unit yesterday  Objective:  Vital signs in last 24 hours:  Temp:  [97.6 F (36.4 C)-103 F (39.4 C)] 98.8 F (37.1 C) (09/21 1026) Pulse Rate:  [95-119] 103 (09/21 1026) Resp:  [13-18] 16 (09/21 0321) BP: (101-139)/(63-90) 117/80 (09/21 1026) SpO2:  [94 %-98 %] 95 % (09/21 1026)  Weight change:  Filed Weights   06/16/20 2228  Weight: 60.8 kg    Intake/Output: I/O last 3 completed shifts: In: 1569 [P.O.:480; I.V.:359.3; Blood:301; IV Piggyback:428.7] Out: 2000 [Other:2000]   Intake/Output this shift:  Total I/O In: 30 [P.O.:30] Out: -   Physical Exam: General: Resting in bed, receiving dialysis treatment  Head:  Moist oral mucosal membranes moist  Eyes: Sclerae and conjunctivae clear  Neck: Supple, trachea midline  Lungs:  Clear to auscultation  Heart: S1S2 Regular rate and rhythm  Abdomen:  Soft, nontender,   Extremities:  No peripheral edema.  Neurologic: Alert,oriented x3  Skin: No acute rashes or lesions  Access: RIJ permcath     Basic Metabolic Panel: Recent Labs  Lab 06/20/20 0543 06/20/20 0543 06/22/20 0447 06/23/20 0911 06/25/20 0812 06/26/20 1012  NA 133*  --   --  130* 127* 131*  K 4.0  --   --  4.1 4.7 4.2  CL 97*  --   --  92* 90* 94*  CO2 21*  --   --  27 21* 24  GLUCOSE 108*  --   --  111* 93 153*  BUN 74*  --   --  40* 78* 46*  CREATININE 10.51*  --   --  7.66* 12.10* 8.61*  CALCIUM 8.0*   < >  --  7.7* 7.7* 7.8*  PHOS  --   --  8.0* 7.0*  --   --    < > = values in this interval not displayed.    Liver Function Tests: Recent Labs  Lab 06/23/20 0911 06/26/20 1012  AST  --  32  ALT  --  29  ALKPHOS  --  70  BILITOT  --  0.6  PROT  --  7.1  ALBUMIN 2.2* 2.1*   No results for input(s): LIPASE, AMYLASE in the last 168 hours. No results for input(s): AMMONIA in the last 168  hours.  CBC: Recent Labs  Lab 06/20/20 0543 06/20/20 0543 06/22/20 2319 06/23/20 0911 06/24/20 0807 06/25/20 0812 06/26/20 1012  WBC 9.3  --   --   --   --  13.0* 11.1*  NEUTROABS  --   --   --   --   --  9.9* 8.4*  HGB 9.3*   < > 6.5* 7.2* 7.0* 7.0* 7.7*  HCT 26.6*   < > 20.4* 22.0* 21.7* 21.4* 23.4*  MCV 78.9*  --   --   --   --  83.6 84.2  PLT 220  --   --   --   --  345 372   < > = values in this interval not displayed.    Cardiac Enzymes: No results for input(s): CKTOTAL, CKMB, CKMBINDEX, TROPONINI in the last 168 hours.  BNP: Invalid input(s): POCBNP  CBG: No results for input(s): GLUCAP in the last 168 hours.  Microbiology: Results for orders placed or performed during the hospital encounter of 06/16/20  SARS Coronavirus 2 by RT PCR (hospital order, performed in [facility]  hospital lab) Nasopharyngeal Nasopharyngeal Swab     Status: None   Collection Time: 06/16/20  8:32 AM   Specimen: Nasopharyngeal Swab  Result Value Ref Range Status   SARS Coronavirus 2 NEGATIVE NEGATIVE Final    Comment: (NOTE) SARS-CoV-2 target nucleic acids are NOT DETECTED.  The SARS-CoV-2 RNA is generally detectable in upper and lower respiratory specimens during the acute phase of infection. The lowest concentration of SARS-CoV-2 viral copies this assay can detect is 250 copies / mL. A negative result does not preclude SARS-CoV-2 infection and should not be used as the sole basis for treatment or other patient management decisions.  A negative result may occur with improper specimen collection / handling, submission of specimen other than nasopharyngeal swab, presence of viral mutation(s) within the areas targeted by this assay, and inadequate number of viral copies (<250 copies / mL). A negative result must be combined with clinical observations, patient history, and epidemiological information.  Fact Sheet for Patients:   StrictlyIdeas.no  Fact Sheet  for Healthcare Providers: BankingDealers.co.za  This test is not yet approved or  cleared by the [place] FDA and has been authorized for detection and/or diagnosis of SARS-CoV-2 by FDA under an Emergency Use Authorization (EUA).  This EUA will remain in effect (meaning this test can be used) for the duration of the COVID-19 declaration under Section 564(b)(1) of the Act, 21 U.S.C. section 360bbb-3(b)(1), unless the authorization is terminated or revoked sooner.  Performed at [facility], [address] [place], [place] [address]   Blood Culture (routine x 2)     Status: Abnormal   Collection Time: 06/16/20  8:32 AM   Specimen: BLOOD  Result Value Ref Range Status   Specimen Description   Final    BLOOD LEFT FORE ARM Performed at [facility], [address], [place], [place] [address]    Special Requests   Final    BOTTLES DRAWN AEROBIC AND ANAEROBIC Blood Culture results may not be optimal due to an inadequate volume of blood received in culture bottles Performed at [facility], [address] [place], [place] [address]    Culture  Setup Time   Final    GRAM POSITIVE COCCI IN BOTH AEROBIC AND ANAEROBIC BOTTLES CRITICAL VALUE NOTED.  VALUE IS CONSISTENT WITH PREVIOUSLY REPORTED AND CALLED VALUE. Performed at [facility], [address] [place], [place] [address]    Culture (A)  Final    STREPTOCOCCUS PNEUMONIAE SUSCEPTIBILITIES PERFORMED ON PREVIOUS CULTURE WITHIN THE LAST 5 DAYS. Performed at [facility], [place] [address], [place], [place] [address]    Report Status 06/19/2020 FINAL  Final  Blood Culture (routine x 2)     Status: Abnormal   Collection Time: 06/16/20  8:32 AM   Specimen: BLOOD  Result Value Ref Range Status   Specimen Description   Final    BLOOD LEFT FORE ARM Performed at [facility], [address], [place], [place] [address]    Special Requests   Final    BOTTLES  DRAWN AEROBIC AND ANAEROBIC Blood Culture results may not be optimal due to an inadequate volume of blood received in culture bottles Performed at [facility], [address] [place], [place] [address]    Culture  Setup Time   Final    Organism ID to follow [place] AND ANAEROBIC BOTTLES CRITICAL RESULT CALLED TO, READ BACK BY AND VERIFIED WITH: DAVID BESANTI 06/16/20 AT  2148 HS Performed at [facility] [address] [place], [place] [address]    Culture STREPTOCOCCUS PNEUMONIAE (A)  Final   Report Status 06/19/2020 FINAL  Final   Organism ID, Bacteria STREPTOCOCCUS PNEUMONIAE  Final      Susceptibility   Streptococcus pneumoniae - MIC*    ERYTHROMYCIN >=8 RESISTANT Resistant     LEVOFLOXACIN 0.5 SENSITIVE Sensitive     VANCOMYCIN 0.5 SENSITIVE Sensitive     PENICILLIN (meningitis) 1 RESISTANT Resistant     PENO - penicillin 1      PENICILLIN (non-meningitis) 1 SENSITIVE Sensitive     PENICILLIN (oral) 1 INTERMEDIATE Intermediate     CEFTRIAXONE (non-meningitis) 1 SENSITIVE Sensitive     CEFTRIAXONE (meningitis) 1 INTERMEDIATE Intermediate     * STREPTOCOCCUS PNEUMONIAE  Blood Culture ID Panel (Reflexed)     Status: Abnormal   Collection Time: 06/16/20  8:32 AM  Result Value Ref Range Status   Enterococcus faecalis NOT DETECTED NOT DETECTED Final   Enterococcus Faecium NOT DETECTED NOT DETECTED Final   Listeria monocytogenes NOT DETECTED NOT DETECTED Final   Staphylococcus species NOT DETECTED NOT DETECTED Final   Staphylococcus aureus (BCID) NOT DETECTED NOT DETECTED Final   Staphylococcus epidermidis NOT DETECTED NOT DETECTED Final   Staphylococcus lugdunensis NOT DETECTED NOT DETECTED Final   Streptococcus species DETECTED (A) NOT DETECTED Final    Comment: CRITICAL RESULT CALLED TO, READ BACK BY AND VERIFIED WITH: DAVID BESANTI 06/16/20 AT 2148 HS    Streptococcus agalactiae NOT DETECTED NOT DETECTED Final   Streptococcus  pneumoniae DETECTED (A) NOT DETECTED Final    Comment: CRITICAL RESULT CALLED TO, READ BACK BY AND VERIFIED WITH: DAVID BESANTI 06/16/20 AT 2148 HS    Streptococcus pyogenes NOT DETECTED NOT DETECTED Final   A.calcoaceticus-baumannii NOT DETECTED NOT DETECTED Final   Bacteroides fragilis NOT DETECTED NOT DETECTED Final   Enterobacterales NOT DETECTED NOT DETECTED Final   Enterobacter cloacae complex NOT DETECTED NOT DETECTED Final   Escherichia coli NOT DETECTED NOT DETECTED Final   Klebsiella aerogenes NOT DETECTED NOT DETECTED Final   Klebsiella oxytoca NOT DETECTED NOT DETECTED Final   Klebsiella pneumoniae NOT DETECTED NOT DETECTED Final   Proteus species NOT DETECTED NOT DETECTED Final   Salmonella species NOT DETECTED NOT DETECTED Final   Serratia marcescens NOT DETECTED NOT DETECTED Final   Haemophilus influenzae NOT DETECTED NOT DETECTED Final   Neisseria meningitidis NOT DETECTED NOT DETECTED Final   Pseudomonas aeruginosa NOT DETECTED NOT DETECTED Final   Stenotrophomonas maltophilia NOT DETECTED NOT DETECTED Final   Candida albicans NOT DETECTED NOT DETECTED Final   Candida auris NOT DETECTED NOT DETECTED Final   Candida glabrata NOT DETECTED NOT DETECTED Final   Candida krusei NOT DETECTED NOT DETECTED Final   Candida parapsilosis NOT DETECTED NOT DETECTED Final   Candida tropicalis NOT DETECTED NOT DETECTED Final   Cryptococcus neoformans/gattii NOT DETECTED NOT DETECTED Final    Comment: Performed at [facility], [address] [place], [place] [address]  CULTURE, BLOOD (ROUTINE X 2) w Reflex to ID Panel     Status: None (Preliminary result)   Collection Time: 06/25/20  7:13 PM   Specimen: BLOOD  Result Value Ref Range Status   Specimen Description BLOOD LEFT ANTECUBITAL  Final   Special Requests   Final    BOTTLES DRAWN AEROBIC AND ANAEROBIC Blood Culture adequate volume   Culture   Final    NO GROWTH < 12 HOURS Performed at  [facility], [address], [place], [place] [address]    Report Status PENDING  Incomplete  CULTURE, BLOOD (ROUTINE X 2) w Reflex to ID Panel     Status: None (Preliminary result)   Collection Time: 06/25/20  7:27 PM   Specimen: BLOOD  Result Value Ref Range Status   Specimen Description BLOOD BLOOD LEFT HAND  Final   Special Requests   Final    BOTTLES DRAWN AEROBIC AND ANAEROBIC Blood Culture adequate volume   Culture   Final    NO GROWTH < 12 HOURS Performed at [facility], [address] [place], [place] [address]    Report Status PENDING  Incomplete    Coagulation Studies: No results for input(s): LABPROT, INR in the last 72 hours.  Urinalysis: No results for input(s): COLORURINE, LABSPEC, PHURINE, GLUCOSEU, HGBUR, BILIRUBINUR, KETONESUR, PROTEINUR, UROBILINOGEN, NITRITE, LEUKOCYTESUR in the last 72 hours.  Invalid input(s): APPERANCEUR    Imaging: CT ANGIO CHEST PE W OR WO CONTRAST  Result Date: 06/25/2020 CLINICAL DATA:  Tachycardia EXAM: CT ANGIOGRAPHY CHEST WITH CONTRAST TECHNIQUE: Multidetector CT imaging of the chest was performed using the standard protocol during bolus administration of intravenous contrast. Multiplanar CT image reconstructions and MIPs were obtained to evaluate the vascular anatomy. CONTRAST:  17mL OMNIPAQUE IOHEXOL 350 MG/ML SOLN COMPARISON:  Chest x-ray from earlier in the same day. FINDINGS: Cardiovascular: Thoracic aorta and its branches are well visualize without aneurysmal dilatation or dissection. Heart is enlarged in size similar to that seen on prior plain film examination. The pulmonary artery is well visualized within normal branching pattern. No filling defects to suggest pulmonary emboli are noted. Mediastinum/Nodes: Thoracic inlet is within normal limits. No sizable hilar or mediastinal adenopathy is noted. The esophagus as visualized is within normal limits. Lungs/Pleura: Large partially loculated right-sided pleural effusion with right lower  lobe consolidation. Some ground-glass opacity is noted within the right upper lobe as well. Loculated fluid in the minor fissure is seen. Patchy airspace opacity is noted within the left upper lobe consistent with that seen on recent chest x-ray consistent with multifocal pneumonia. No left-sided pleural effusion is seen. No pneumothorax is noted. Upper Abdomen: Visualized upper abdomen shows no acute abnormality. Musculoskeletal: No acute bony abnormality is seen. Review of the MIP images confirms the above findings. IMPRESSION: Multifocal infiltrates in the upper lobes bilaterally. Large right-sided pleural effusion which is at least partially loculated with associated right lower lobe collapse. This is also felt to be related to underlying pneumonia. No evidence of pulmonary emboli. Electronically Signed   By: Inez Catalina M.D.   On: 06/25/2020 22:11   DG Chest Port 1 View  Result Date: 06/25/2020 CLINICAL DATA:  Fever and cough. EXAM: PORTABLE CHEST 1 VIEW COMPARISON:  06/16/20 FINDINGS: There is a right chest wall dialysis catheter with tips at the cavoatrial junction. Cardiac enlargement. Interval development of moderate to large right pleural effusion which may be partially loculated with fluid accumulation along the minor fissure. Veil like opacification of the right midlung and right base noted. Patchy airspace densities are noted within the left mid lung and left lower lobe. IMPRESSION: 1. Interval development of moderate to large right pleural effusion which may be partially loculated with fluid accumulation along the minor fissure. 2. Left mid lung and left lower lobe airspace densities concerning for amultifocal infection. Electronically Signed   By: Kerby Moors M.D.   On: 06/25/2020 19:23     Medications:   . sodium chloride  Stopped (06/26/20 0316)  . ceFEPime (MAXIPIME) IV Stopped (06/25/20 2108)   . amLODipine  10 mg Oral Daily  . carvedilol  25 mg Oral BID WC  . Chlorhexidine  Gluconate Cloth  6 each Topical Q0600  . Darunavir-Cobicisctat-Emtricitabine-Tenofovir Alafenamide  1 tablet Oral Q supper  . feeding supplement (NEPRO CARB STEADY)  237 mL Oral BID BM  . hydrALAZINE  50 mg Oral Q8H  . losartan  100 mg Oral Daily  . multivitamin  1 tablet Oral QHS  . sulfamethoxazole-trimethoprim  1 tablet Oral Daily  . [START ON 06/27/2020] vancomycin  500 mg Intravenous Q M,W,F-HD   sodium chloride, acetaminophen, chlorpheniramine-HYDROcodone, hydrALAZINE, HYDROmorphone (DILAUDID) injection, labetalol, morphine injection, ondansetron (ZOFRAN) IV, oxyCODONE-acetaminophen, promethazine  Assessment/ Plan:  Mr. Daniel Combs is a 29 y.o.  male with a PMHx oflongstanding hypertension, ESRD not yet on dialysis, anemia of chronic kidney disease, secondary hyperparathyroidism,HIV,who was [facility] to [facility] on[facility]11[facility]2021for evaluation of shortness of breath and chest pain.   1. ESRDinitiated on dialysis this admission Patient was last seen by nephrologist in [place] approximately 1 year ago. 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 Kidney is atrophic on renal ultrasound.  - continue MWF schedule  -Outpatient hemodialysis center placement at [facility] - No active bleeding from Rt IJ catheter site    2. Hypertension.with tachycardia.  -Continue amlodipine, carvedilol, hydralazine, and losartan.  3. Anemia of chronic kidney disease.status post PRBC transfusion on 9/20.  - EPO with HD treatment - Avoid further blood transfusions if possible; due to possibility of a future transplant  -Will continue monitoring CBCs  4. Secondary Hyperparathyroidism: PTH 645 on this admission.  - check serum phos  tomorrow      LOS: 10 Rabiah Goeser 9/21/202112:24 PM

## 2020-06-26 NOTE — Progress Notes (Signed)
Patient tolerated blood infusion well, patient allowed this RN to get post blood vital signs. Patient continues to be a yellow mews due to heart rate and fever. Daniel Combs

## 2020-06-26 NOTE — Consult Note (Signed)
Chief Complaint: Patient was seen in consultation today for CT-guided drainage of right complex pleural effusion Chief Complaint  Patient presents with   Chest Pain   Cough   Back Pain    Referring Physician(s): Finnegan,T  Supervising Physician: Suttle,D  Patient Status: [facility] - In-pt  History of Present Illness: Daniel Combs is a 29 y.o. male smoker with past medical history of hypertension, HIV, and untreated renal failure with  PermCath placement on 06/18/20.  He recently presented to [facility] with dyspnea and imaging findings of multifocal infiltrates in the upper lobes of both lungs with a large right-sided complex pleural effusion/parapneumonic effusion.  Blood cultures were positive for Streptococcus pneumonia.  He is COVID-19 negative.  Request now received from cardiothoracic surgery for CT-guided right chest pigtail catheter placement. He is afebrile.  Past Medical History:  Diagnosis Date   Hypertension     Past Surgical History:  Procedure Laterality Date   DIALYSIS/PERMA CATHETER INSERTION N/A 06/18/2020   Procedure: DIALYSIS/PERMA CATHETER INSERTION;  Surgeon: Algernon Huxley, MD;  Location: [facility];  Service: Cardiovascular;  Laterality: N/A;    Allergies: Patient has no known allergies.  Medications: Prior to Admission medications   Medication Sig Start Date End Date Taking? Authorizing Provider  losartan (COZAAR) 100 MG tablet Take 100 mg by mouth daily. 05/10/20  Yes [provider]     Family History  Problem Relation Age of Onset   Diabetes Mellitus II Mother    Hypertension Mother     Social History   Socioeconomic History   Marital status: Unknown    Spouse name: Not on file   Number of children: Not on file   Years of education: Not on file   Highest education level: Not on file  Occupational History   Not on file  Tobacco Use   Smoking status: Current Every Day Smoker   Smokeless tobacco: Never Used    Substance and Sexual Activity   Alcohol use: Not Currently   Drug use: Never   Sexual activity: Not on file  Other Topics Concern   Not on file  Social History Narrative   Not on file   Social Determinants of Health   Financial Resource Strain:    Difficulty of Paying Living Expenses: Not on file  Food Insecurity:    Worried About [place] in the Last Year: Not on file   Ran Out of Food in the Last Year: Not on file  Transportation Needs:    Lack of Transportation (Medical): Not on file   Lack of Transportation (Non-Medical): Not on file  Physical Activity:    Days of Exercise per Week: Not on file   Minutes of Exercise per Session: Not on file  Stress:    Feeling of Stress : Not on file  Social Connections:    Frequency of Communication with Friends and Family: Not on file   Frequency of Social Gatherings with Friends and Family: Not on file   Attends Religious Services: Not on file   Active Member of Clubs or Organizations: Not on file   Attends Archivist Meetings: Not on file   Marital Status: Not on file      Review of Systems denies headache, chest pain, abdominal pain, back pain, nausea, vomiting or bleeding.  He does have some dyspnea and occ cough  Vital Signs: BP 117/80    Pulse (!) 103    Temp 98.8 F (37.1 C)  Resp 16    Ht 5\' 6"  (1.676 m)    Wt 134 lb (60.8 kg)    SpO2 95%    BMI 21.63 kg/m   Physical Exam awake, alert.  Chest with diminished breath sounds right base, left clear.  Heart with slightly tachycardic but regular rhythm.  Abdomen soft, positive bowel sounds, nontender.  No lower extremity edema.  Imaging: CT ANGIO CHEST PE W OR WO CONTRAST  Result Date: 06/25/2020 CLINICAL DATA:  Tachycardia EXAM: CT ANGIOGRAPHY CHEST WITH CONTRAST TECHNIQUE: Multidetector CT imaging of the chest was performed using the standard protocol during bolus administration of intravenous contrast. Multiplanar CT image  reconstructions and MIPs were obtained to evaluate the vascular anatomy. CONTRAST:  27mL OMNIPAQUE IOHEXOL 350 MG/ML SOLN COMPARISON:  Chest x-ray from earlier in the same day. FINDINGS: Cardiovascular: Thoracic aorta and its branches are well visualize without aneurysmal dilatation or dissection. Heart is enlarged in size similar to that seen on prior plain film examination. The pulmonary artery is well visualized within normal branching pattern. No filling defects to suggest pulmonary emboli are noted. Mediastinum/Nodes: Thoracic inlet is within normal limits. No sizable hilar or mediastinal adenopathy is noted. The esophagus as visualized is within normal limits. Lungs/Pleura: Large partially loculated right-sided pleural effusion with right lower lobe consolidation. Some ground-glass opacity is noted within the right upper lobe as well. Loculated fluid in the minor fissure is seen. Patchy airspace opacity is noted within the left upper lobe consistent with that seen on recent chest x-ray consistent with multifocal pneumonia. No left-sided pleural effusion is seen. No pneumothorax is noted. Upper Abdomen: Visualized upper abdomen shows no acute abnormality. Musculoskeletal: No acute bony abnormality is seen. Review of the MIP images confirms the above findings. IMPRESSION: Multifocal infiltrates in the upper lobes bilaterally. Large right-sided pleural effusion which is at least partially loculated with associated right lower lobe collapse. This is also felt to be related to underlying pneumonia. No evidence of pulmonary emboli. Electronically Signed   By: Inez Catalina M.D.   On: 06/25/2020 22:11   US RENAL  Result Date: 06/17/2020 CLINICAL DATA:  End-stage renal disease EXAM: RENAL / URINARY TRACT ULTRASOUND COMPLETE COMPARISON:  None. FINDINGS: Right Kidney: Renal measurements: 8.1 x 4.3 x 3.9 cm = volume: 73 mL. The kidney is atrophic and severely echogenic. There is no hydronephrosis. Left Kidney: Renal  measurements: 6.7 x 3.8 x 3.6 cm = volume: 49 mL. The kidney is atrophic and severely echogenic. There is no hydronephrosis. Bladder: Appears normal for degree of bladder distention. Other: None. IMPRESSION: Small, echogenic kidneys bilaterally consistent with a history of end-stage renal disease. There is no hydronephrosis. Electronically Signed   By: Constance Holster M.D.   On: 06/17/2020 16:06   PERIPHERAL VASCULAR CATHETERIZATION  Result Date: 06/18/2020 See op note  DG Chest Port 1 View  Result Date: 06/25/2020 CLINICAL DATA:  Fever and cough. EXAM: PORTABLE CHEST 1 VIEW COMPARISON:  06/16/20 FINDINGS: There is a right chest wall dialysis catheter with tips at the cavoatrial junction. Cardiac enlargement. Interval development of moderate to large right pleural effusion which may be partially loculated with fluid accumulation along the minor fissure. Veil like opacification of the right midlung and right base noted. Patchy airspace densities are noted within the left mid lung and left lower lobe. IMPRESSION: 1. Interval development of moderate to large right pleural effusion which may be partially loculated with fluid accumulation along the minor fissure. 2. Left mid lung and left lower lobe  airspace densities concerning for amultifocal infection. Electronically Signed   By: Kerby Moors M.D.   On: 06/25/2020 19:23   DG Chest Port 1 View  Result Date: 06/16/2020 CLINICAL DATA:  Shortness of breath with cough and chest pain EXAM: PORTABLE CHEST 1 VIEW COMPARISON:  None. FINDINGS: There is somewhat ill-defined airspace opacity in portions of the left mid lower lung regions. The right lung is clear. Heart is upper normal in size with pulmonary vascularity normal. No adenopathy. No bone lesions. IMPRESSION: Ill-defined airspace opacity left mid and lower lung regions consistent with multifocal pneumonia. Question atypical organism pneumonia given this appearance. Lungs elsewhere clear. Heart upper  normal in size. No evident adenopathy. Electronically Signed   By: Lowella Grip III M.D.   On: 06/16/2020 09:21    Labs:  CBC: Recent Labs    06/19/20 0621 06/19/20 0621 06/20/20 0543 06/22/20 2319 06/23/20 0911 06/24/20 0807 06/25/20 0812 06/26/20 1012  WBC 8.3  --  9.3  --   --   --  13.0* 11.1*  HGB 7.5*   < > 9.3*   < > 7.2* 7.0* 7.0* 7.7*  HCT 22.4*   < > 26.6*   < > 22.0* 21.7* 21.4* 23.4*  PLT 158  --  220  --   --   --  345 372   < > = values in this interval not displayed.    COAGS: Recent Labs    06/23/20 0032  INR 1.1  APTT 41*    BMP: Recent Labs    06/20/20 0543 06/23/20 0911 06/25/20 0812 06/26/20 1012  NA 133* 130* 127* 131*  K 4.0 4.1 4.7 4.2  CL 97* 92* 90* 94*  CO2 21* 27 21* 24  GLUCOSE 108* 111* 93 153*  BUN 74* 40* 78* 46*  CALCIUM 8.0* 7.7* 7.7* 7.8*  CREATININE 10.51* 7.66* 12.10* 8.61*  GFRNONAA 6* 9* 5* 7*  GFRAA 7* 10* 6* 9*    LIVER FUNCTION TESTS: Recent Labs    06/16/20 0832 06/23/20 0911 06/26/20 1012  BILITOT 0.7  --  0.6  AST 15  --  32  ALT 30  --  29  ALKPHOS 63  --  70  PROT 8.5*  --  7.1  ALBUMIN 3.2* 2.2* 2.1*    TUMOR MARKERS: No results for input(s): AFPTM, CEA, CA199, CHROMGRNA in the last 8760 hours.  Assessment and Plan: 29 y.o. male smoker with past medical history of hypertension, HIV, and untreated renal failure with  PermCath placement on 06/18/20.  He recently presented to [facility] with dyspnea and imaging findings of multifocal infiltrates in the upper lobes of both lungs with a large right-sided complex pleural effusion/parapneumonic effusion.  Blood cultures were positive for Streptococcus pneumonia.  He is COVID-19 negative.  Request now received from cardiothoracic surgery for CT-guided right chest pigtail catheter placement. He is afebrile.  Imaging studies have been reviewed by Dr. Kathlene Cote.  Details/risks of procedure, including but not limited to, internal bleeding, infection, injury to  adjacent structures/pneumothorax discussed with patient with his understanding and consent.    Procedure planned for 9/22   Thank you for this interesting consult.  I greatly enjoyed meeting Daniel Combs and look forward to participating in their care.  A copy of this report was sent to the requesting provider on this date.  Electronically Signed: D. Rowe Robert, PA-C 06/26/2020, 12:59 PM   I spent a total of 30 minutes    in face to face in clinical  consultation, greater than 50% of which was counseling/coordinating care for right chest drain placement

## 2020-06-26 NOTE — Progress Notes (Addendum)
Progress Note    Daniel Combs  MCN:[id number] DOB: April 15, 1991  DOA: 06/16/2020 PCP: Pcp, No      Brief Narrative:    Medical records reviewed and are as summarized below:  Daniel Combs is a 29 y.o. male with medical history significant for hypertension, tobacco use disorder, ESRD who was not on dialysis prior to admission, HIV infection, medical nonadherence.  He presented to the hospital with cough, pleuritic chest pain, nausea, vomiting and shortness of breath.  He was admitted to the hospital for sepsis secondary to community-acquired pneumonia.  He was treated with IV fluids and empiric IV antibiotics.  Blood culture grew strep pneumoniae and urine antigen for strep pneumo was positive.   He completed 10 days of IV Rocephin.  He developed recurrent fever and CT chest revealed loculated right pleural effusion.  He was then started on IV vancomycin and cefepime.  CT surgery was consulted to assist with management.  ID specialist was also involved in his care. He has HIV infection. CD4 count was 74 and HIV 1 RNA viral load was 14,900.  ID restarted antiretroviral therapy (Symtuza) and Bactrim for PCP prophylaxis.  Nephrologist was consulted for end-stage renal disease.  Vascular surgeon placed A tunneled hemodialysis catheter via right IJ vein on 06/18/2020.  Patient was started on hemodialysis on this admission.  He had bleeding complication from permacath entry site on the right upper chest wall requiring additional sutures from vascular surgeon.  He has chronic hyponatremia likely from kidney disease.  He was also transfused with packed red blood cells for severe anemia    Assessment/Plan:   Principal Problem:   Sepsis ([place]) Active Problems:   CAP (community acquired pneumonia)   Bacteremia due to Streptococcus pneumoniae   Hypertension   ESRD (end stage renal disease) (HCC)   Hyponatremia   Hyperkalemia   Elevated troponin   Anemia in ESRD (end-stage renal  disease) (HCC)   Pleural effusion, right Strep pneumo bacteremia-strep pneumo antigen positive and strep pneumo in blood HIV infection Immunocompromised state-CD4 count of 74 Acute blood loss anemia from bleeding around permacath site Right-sided loculated large pleural effusion Reactive RPR  Body mass index is 21.63 kg/m.    PLAN   CTA chest was negative for pulmonary embolism but there was evidence of right-sided loculated large pleural effusion.  Ordered ultrasound-guided right-sided thoracentesis.  Consulted CT surgeon for further evaluation of loculated right pleural effusion.  IV Rocephin was changed to IV vancomycin and cefepime on 06/25/2020.  Continue Bactrim for PCP prophylaxis  Continue antihypertensives  S/p transfusion with 1 unit of PRBCs on 06/17/2020, 06/23/2020 and 06/25/2020.   S/p treatment with IV Venofer on 06/19/2020 and 06/20/2020.  Patient has a dialysis spot at outpatient dialysis center at [facility] dialysis center on [address] but insurance authorization is still pending.     Diet Order            Diet NPO time specified Except for: Sips with Meds  Diet effective midnight           Diet NPO time specified Except for: Ice Chips  Diet effective now                    Consultants:  Nephrologist  Infectious disease  CT surgeon  Procedures:  Placement of tunneled hemodialysis catheter via right IJ vein on 06/18/2020  Hemodialysis  Area around the right upper chest wall permacath site was sutured (on 06/23/2020) to  control bleeding.    Medications:   . amLODipine  10 mg Oral Daily  . carvedilol  25 mg Oral BID WC  . Chlorhexidine Gluconate Cloth  6 each Topical Q0600  . Darunavir-Cobicisctat-Emtricitabine-Tenofovir Alafenamide  1 tablet Oral Q supper  . feeding supplement (NEPRO CARB STEADY)  237 mL Oral BID BM  . hydrALAZINE  50 mg Oral Q8H  . losartan  100 mg Oral Daily  . multivitamin  1 tablet Oral QHS   . sulfamethoxazole-trimethoprim  1 tablet Oral Daily  . [START ON 06/27/2020] vancomycin  500 mg Intravenous Q M,W,F-HD   Continuous Infusions: . sodium chloride Stopped (06/26/20 0316)  . ceFEPime (MAXIPIME) IV Stopped (06/25/20 2108)     Anti-infectives (From admission, onward)   Start     Dose/Rate Route Frequency Ordered Stop   06/27/20 1200  vancomycin (VANCOCIN) IVPB 500 mg/100 ml premix        500 mg 100 mL/hr over 60 Minutes Intravenous Every M-W-F (Hemodialysis) 06/25/20 1924     06/25/20 2000  vancomycin (VANCOREADY) IVPB 1250 mg/250 mL        1,250 mg 166.7 mL/hr over 90 Minutes Intravenous  Once 06/25/20 1924 06/25/20 2256   06/25/20 2000  ceFEPIme (MAXIPIME) 1 g in sodium chloride 0.9 % 100 mL IVPB        1 g 200 mL/hr over 30 Minutes Intravenous Every 24 hours 06/25/20 1924     06/25/20 1700  Darunavir-Cobicisctat-Emtricitabine-Tenofovir Alafenamide (SYMTUZA) 800-150-200-10 MG TABS 1 tablet        1 tablet Oral Daily with supper 06/25/20 1320     06/19/20 2000  sulfamethoxazole-trimethoprim (BACTRIM) 400-80 MG per tablet 1 tablet        1 tablet Oral Daily 06/19/20 1729     06/16/20 1130  cefTRIAXone (ROCEPHIN) 2 g in sodium chloride 0.9 % 100 mL IVPB  Status:  Discontinued        2 g 200 mL/hr over 30 Minutes Intravenous Every 24 hours 06/16/20 1123 06/25/20 1924   06/16/20 1130  azithromycin (ZITHROMAX) 500 mg in sodium chloride 0.9 % 250 mL IVPB  Status:  Discontinued        500 mg 250 mL/hr over 60 Minutes Intravenous Every 24 hours 06/16/20 1123 06/18/20 2016             Family Communication/Anticipated D/C date and plan/Code Status   DVT prophylaxis: Place and maintain sequential compression device Start: 06/26/20 1100 Place and maintain sequential compression device Start: 06/17/20 0741     Code Status: Full Code  Family Communication: Plan discussed with patient Disposition Plan:    Status is: Inpatient  Remains inpatient appropriate  because:IV treatments appropriate due to intensity of illness or inability to take PO   Dispo: The patient is from: Home              Anticipated d/c is to: Home              Anticipated d/c date is: 3 days              Patient currently is not medically stable to d/c.  Awaiting final recommendations from ID.  Patient has to get an outpatient hemodialysis spot prior to discharge.           Subjective:   He had fever with T-max of 103 last night.  He also had pleuritic chest pain and cough yesterday evening.  He had fever this morning with temperature of 100.8. Today he  feels a little better. He has been refusing care at times.  Objective:    Vitals:   06/26/20 0056 06/26/20 0056 06/26/20 0321 06/26/20 1026  BP: 111/74 111/74 127/86 117/80  Pulse: (!) 106 (!) 106 (!) 111 (!) 103  Resp: 16 16 16    Temp: (!) 100.8 F (38.2 C) (!) 100.8 F (38.2 C) 100.3 F (37.9 C) 98.8 F (37.1 C)  TempSrc:  Oral Oral   SpO2:  95% 96% 95%  Weight:      Height:       No data found.   Intake/Output Summary (Last 24 hours) at 06/26/2020 1422 Last data filed at 06/26/2020 1035 Gross per 24 hour  Intake 1119 ml  Output --  Net 1119 ml   Filed Weights   06/16/20 2228  Weight: 60.8 kg    Exam:   GEN: NAD SKIN: No rash.  Permacath in the right upper chest wall EYES: EOMI ENT: MMM CV: RRR PULM: CTA B ABD: soft, ND, NT, +BS CNS: AAO x 3, non focal EXT: No edema or tenderness       Data Reviewed:   I have personally reviewed following labs and imaging studies:  Labs: Labs show the following:   Basic Metabolic Panel: Recent Labs  Lab 06/20/20 0543 06/20/20 0543 06/22/20 0447 06/23/20 0911 06/23/20 0911 06/25/20 0812 06/26/20 1012  NA 133*  --   --  130*  --  127* 131*  K 4.0   < >  --  4.1   < > 4.7 4.2  CL 97*  --   --  92*  --  90* 94*  CO2 21*  --   --  27  --  21* 24  GLUCOSE 108*  --   --  111*  --  93 153*  BUN 74*  --   --  40*  --  78* 46*   CREATININE 10.51*  --   --  7.66*  --  12.10* 8.61*  CALCIUM 8.0*  --   --  7.7*  --  7.7* 7.8*  PHOS  --   --  8.0* 7.0*  --   --   --    < > = values in this interval not displayed.   GFR Estimated Creatinine Clearance: 10.8 mL/min (A) (by C-G formula based on SCr of 8.61 mg/dL (H)). Liver Function Tests: Recent Labs  Lab 06/23/20 0911 06/26/20 1012  AST  --  32  ALT  --  29  ALKPHOS  --  70  BILITOT  --  0.6  PROT  --  7.1  ALBUMIN 2.2* 2.1*   No results for input(s): LIPASE, AMYLASE in the last 168 hours. No results for input(s): AMMONIA in the last 168 hours. Coagulation profile Recent Labs  Lab 06/23/20 0032  INR 1.1    CBC: Recent Labs  Lab 06/20/20 0543 06/20/20 0543 06/22/20 2319 06/23/20 0911 06/24/20 0807 06/25/20 0812 06/26/20 1012  WBC 9.3  --   --   --   --  13.0* 11.1*  NEUTROABS  --   --   --   --   --  9.9* 8.4*  HGB 9.3*   < > 6.5* 7.2* 7.0* 7.0* 7.7*  HCT 26.6*   < > 20.4* 22.0* 21.7* 21.4* 23.4*  MCV 78.9*  --   --   --   --  83.6 84.2  PLT 220  --   --   --   --  345 372   < > =  values in this interval not displayed.   Cardiac Enzymes: No results for input(s): CKTOTAL, CKMB, CKMBINDEX, TROPONINI in the last 168 hours. BNP (last 3 results) No results for input(s): PROBNP in the last 8760 hours. CBG: No results for input(s): GLUCAP in the last 168 hours. D-Dimer: No results for input(s): DDIMER in the last 72 hours. Hgb A1c: No results for input(s): HGBA1C in the last 72 hours. Lipid Profile: No results for input(s): CHOL, HDL, LDLCALC, TRIG, CHOLHDL, LDLDIRECT in the last 72 hours. Thyroid function studies: No results for input(s): TSH, T4TOTAL, T3FREE, THYROIDAB in the last 72 hours.  Invalid input(s): FREET3 Anemia work up: No results for input(s): VITAMINB12, FOLATE, FERRITIN, TIBC, IRON, RETICCTPCT in the last 72 hours. Sepsis Labs: Recent Labs  Lab 06/20/20 0543 06/25/20 0812 06/25/20 1911 06/25/20 1927 06/26/20 0518  06/26/20 1012  PROCALCITON  --   --  11.77  --  9.18  --   WBC 9.3 13.0*  --   --   --  11.1*  LATICACIDVEN  --   --   --  0.8  --   --     Microbiology Recent Results (from the past 240 hour(s))  CULTURE, BLOOD (ROUTINE X 2) w Reflex to ID Panel     Status: None (Preliminary result)   Collection Time: 06/25/20  7:13 PM   Specimen: BLOOD  Result Value Ref Range Status   Specimen Description BLOOD LEFT ANTECUBITAL  Final   Special Requests   Final    BOTTLES DRAWN AEROBIC AND ANAEROBIC Blood Culture adequate volume   Culture   Final    NO GROWTH < 12 HOURS Performed at [facility], [address] [place], [place] [address]    Report Status PENDING  Incomplete  CULTURE, BLOOD (ROUTINE X 2) w Reflex to ID Panel     Status: None (Preliminary result)   Collection Time: 06/25/20  7:27 PM   Specimen: BLOOD  Result Value Ref Range Status   Specimen Description BLOOD BLOOD LEFT HAND  Final   Special Requests   Final    BOTTLES DRAWN AEROBIC AND ANAEROBIC Blood Culture adequate volume   Culture   Final    NO GROWTH < 12 HOURS Performed at [facility], [address], [place], [place] [address]    Report Status PENDING  Incomplete    Procedures and diagnostic studies:  CT ANGIO CHEST PE W OR WO CONTRAST  Result Date: 06/25/2020 CLINICAL DATA:  Tachycardia EXAM: CT ANGIOGRAPHY CHEST WITH CONTRAST TECHNIQUE: Multidetector CT imaging of the chest was performed using the standard protocol during bolus administration of intravenous contrast. Multiplanar CT image reconstructions and MIPs were obtained to evaluate the vascular anatomy. CONTRAST:  35mL OMNIPAQUE IOHEXOL 350 MG/ML SOLN COMPARISON:  Chest x-ray from earlier in the same day. FINDINGS: Cardiovascular: Thoracic aorta and its branches are well visualize without aneurysmal dilatation or dissection. Heart is enlarged in size similar to that seen on prior plain film examination. The pulmonary artery is well  visualized within normal branching pattern. No filling defects to suggest pulmonary emboli are noted. Mediastinum/Nodes: Thoracic inlet is within normal limits. No sizable hilar or mediastinal adenopathy is noted. The esophagus as visualized is within normal limits. Lungs/Pleura: Large partially loculated right-sided pleural effusion with right lower lobe consolidation. Some ground-glass opacity is noted within the right upper lobe as well. Loculated fluid in the minor fissure is seen. Patchy airspace opacity is noted within the left upper lobe consistent with that  seen on recent chest x-ray consistent with multifocal pneumonia. No left-sided pleural effusion is seen. No pneumothorax is noted. Upper Abdomen: Visualized upper abdomen shows no acute abnormality. Musculoskeletal: No acute bony abnormality is seen. Review of the MIP images confirms the above findings. IMPRESSION: Multifocal infiltrates in the upper lobes bilaterally. Large right-sided pleural effusion which is at least partially loculated with associated right lower lobe collapse. This is also felt to be related to underlying pneumonia. No evidence of pulmonary emboli. Electronically Signed   By: Inez Catalina M.D.   On: 06/25/2020 22:11   DG Chest Port 1 View  Result Date: 06/25/2020 CLINICAL DATA:  Fever and cough. EXAM: PORTABLE CHEST 1 VIEW COMPARISON:  06/16/20 FINDINGS: There is a right chest wall dialysis catheter with tips at the cavoatrial junction. Cardiac enlargement. Interval development of moderate to large right pleural effusion which may be partially loculated with fluid accumulation along the minor fissure. Veil like opacification of the right midlung and right base noted. Patchy airspace densities are noted within the left mid lung and left lower lobe. IMPRESSION: 1. Interval development of moderate to large right pleural effusion which may be partially loculated with fluid accumulation along the minor fissure. 2. Left mid lung and  left lower lobe airspace densities concerning for amultifocal infection. Electronically Signed   By: Kerby Moors M.D.   On: 06/25/2020 19:23               LOS: 10 days   [facility]   Pager on [email or web address]. If 7PM-7AM, please contact night-coverage at [email or web address]     06/26/2020, 2:22 PM

## 2020-06-27 ENCOUNTER — Inpatient Hospital Stay: Payer: Medicaid Other

## 2020-06-27 LAB — RENAL FUNCTION PANEL
Albumin: 2.1 g/dL — ABNORMAL LOW (ref 3.5–5.0)
Anion gap: 13 (ref 5–15)
BUN: 46 mg/dL — ABNORMAL HIGH (ref 6–20)
CO2: 24 mmol/L (ref 22–32)
Calcium: 8.4 mg/dL — ABNORMAL LOW (ref 8.9–10.3)
Chloride: 93 mmol/L — ABNORMAL LOW (ref 98–111)
Creatinine, Ser: 8.17 mg/dL — ABNORMAL HIGH (ref 0.61–1.24)
GFR calc Af Amer: 9 mL/min — ABNORMAL LOW (ref >60)
GFR calc non Af Amer: 8 mL/min — ABNORMAL LOW (ref >60)
Glucose, Bld: 94 mg/dL (ref 70–99)
Phosphorus: 6.2 mg/dL — ABNORMAL HIGH (ref 2.5–4.6)
Potassium: 4.4 mmol/L (ref 3.5–5.1)
Sodium: 130 mmol/L — ABNORMAL LOW (ref 135–145)

## 2020-06-27 LAB — BODY FLUID CELL COUNT WITH DIFFERENTIAL
Eos, Fluid: 0 %
Lymphs, Fluid: 2 %
Monocyte-Macrophage-Serous Fluid: 1 %
Neutrophil Count, Fluid: 97 %
Total Nucleated Cell Count, Fluid: 5015 cu mm

## 2020-06-27 LAB — PROTEIN, PLEURAL OR PERITONEAL FLUID: Total protein, fluid: 5.3 g/dL

## 2020-06-27 LAB — CBC WITH DIFFERENTIAL/PLATELET
Abs Immature Granulocytes: 0.16 10*3/uL — ABNORMAL HIGH (ref 0.00–0.07)
Basophils Absolute: 0.1 10*3/uL (ref 0.0–0.1)
Basophils Relative: 1 %
Eosinophils Absolute: 0.2 10*3/uL (ref 0.0–0.5)
Eosinophils Relative: 2 %
HCT: 23.1 % — ABNORMAL LOW (ref 39.0–52.0)
Hemoglobin: 7.6 g/dL — ABNORMAL LOW (ref 13.0–17.0)
Immature Granulocytes: 2 %
Lymphocytes Relative: 8 %
Lymphs Abs: 0.8 10*3/uL (ref 0.7–4.0)
MCH: 27.9 pg (ref 26.0–34.0)
MCHC: 32.9 g/dL (ref 30.0–36.0)
MCV: 84.9 fL (ref 80.0–100.0)
Monocytes Absolute: 1.8 10*3/uL — ABNORMAL HIGH (ref 0.1–1.0)
Monocytes Relative: 17 %
Neutro Abs: 7.9 10*3/uL — ABNORMAL HIGH (ref 1.7–7.7)
Neutrophils Relative %: 70 %
Platelets: 420 10*3/uL — ABNORMAL HIGH (ref 150–400)
RBC: 2.72 MIL/uL — ABNORMAL LOW (ref 4.22–5.81)
RDW: 14.6 % (ref 11.5–15.5)
WBC: 11 10*3/uL — ABNORMAL HIGH (ref 4.0–10.5)
nRBC: 0 % (ref 0.0–0.2)

## 2020-06-27 LAB — CBC
HCT: 23.5 % — ABNORMAL LOW (ref 39.0–52.0)
Hemoglobin: 7.7 g/dL — ABNORMAL LOW (ref 13.0–17.0)
MCH: 27.4 pg (ref 26.0–34.0)
MCHC: 32.8 g/dL (ref 30.0–36.0)
MCV: 83.6 fL (ref 80.0–100.0)
Platelets: 407 10*3/uL — ABNORMAL HIGH (ref 150–400)
RBC: 2.81 MIL/uL — ABNORMAL LOW (ref 4.22–5.81)
RDW: 14.4 % (ref 11.5–15.5)
WBC: 8.7 10*3/uL (ref 4.0–10.5)
nRBC: 0 % (ref 0.0–0.2)

## 2020-06-27 LAB — T.PALLIDUM AB, TOTAL: T Pallidum Abs: NONREACTIVE

## 2020-06-27 LAB — MRSA PCR SCREENING: MRSA by PCR: NEGATIVE

## 2020-06-27 LAB — GLUCOSE, PLEURAL OR PERITONEAL FLUID: Glucose, Fluid: <20 mg/dL

## 2020-06-27 LAB — PROCALCITONIN: Procalcitonin: <0.1 ng/mL

## 2020-06-27 MED ORDER — MIDAZOLAM HCL 2 MG/2ML IJ SOLN
INTRAMUSCULAR | Status: DC | PRN
  Administered 2020-06-27 (×2): 1 mg via INTRAVENOUS

## 2020-06-27 MED ORDER — EPOETIN ALFA 10000 UNIT/ML IJ SOLN
10000.0000 [IU] | INTRAMUSCULAR | Status: DC
  Administered 2020-06-29 – 2020-07-06 (×4): 10000 [IU] via INTRAVENOUS

## 2020-06-27 MED ORDER — SEVELAMER CARBONATE 800 MG PO TABS
1600.0000 mg | ORAL_TABLET | Freq: Three times a day (TID) | ORAL | Status: DC
  Administered 2020-06-27 – 2020-07-06 (×19): 1600 mg via ORAL
  Filled 2020-06-27 (×22): qty 2

## 2020-06-27 MED ORDER — FENTANYL CITRATE (PF) 100 MCG/2ML IJ SOLN
INTRAMUSCULAR | Status: AC
  Filled 2020-06-27: qty 2

## 2020-06-27 MED ORDER — MIDAZOLAM HCL 5 MG/5ML IJ SOLN
INTRAMUSCULAR | Status: AC
  Filled 2020-06-27: qty 5

## 2020-06-27 MED ORDER — VANCOMYCIN HCL IN DEXTROSE 750-5 MG/150ML-% IV SOLN
750.0000 mg | INTRAVENOUS | Status: DC
  Administered 2020-06-27: 750 mg via INTRAVENOUS
  Filled 2020-06-27 (×2): qty 150

## 2020-06-27 MED ORDER — FENTANYL CITRATE (PF) 100 MCG/2ML IJ SOLN
INTRAMUSCULAR | Status: DC | PRN
  Administered 2020-06-27 (×2): 50 ug via INTRAVENOUS

## 2020-06-27 NOTE — Progress Notes (Signed)
CRITICAL VALUE ALERT  Critical Value:  < 20 (Pleural Fluid)  Date & Time Notied:  06/27/20 1859  Provider Notified: Dr. Priscella Mann  Orders Received/Actions taken: No action necessary per physician at this time.   Fuller Mandril, RN

## 2020-06-27 NOTE — Consult Note (Signed)
Pharmacy Antibiotic Note  Daniel Combs is a 29 y.o. male admitted on 06/16/2020 with sepsis with suspected respiratory source.  Pharmacy has been consulted for Cefepime/Vancomycin dosing. Patient has a PMH significant for ESRD (MWF while here per nehprology notes).   Patient was being treated with CTX which was escalated to Vancomycin and Cefepime on 9/20. Will dose vancomycin for HD, pt weight is currently borderline at 60.8 kg but will adjust to higher dosage for a weight >60 kg. MRSA PCR was collected and returned as negative early 9/22, will follow up with ID to assess need for vancomycin.   Plan: 1. Increase vancomycin dose to 750 mg on dialysis days (after dialysis). Will order a Vancomycin level after 3rd maintenance dose.  2. Continue Cefepime 1g Q24H.  3. Monitor for s/sx of clinical improvement and ability to de-escalate therapy.     Height: 5\' 6"  (167.6 cm) Weight: 60.8 kg (134 lb) IBW/kg (Calculated) : 63.8  Temp (24hrs), Avg:99 F (37.2 C), Min:98.6 F (37 C), Max:99.4 F (37.4 C)  Recent Labs  Lab 06/23/20 0911 06/25/20 0812 06/25/20 1927 06/26/20 1012 06/27/20 0630 06/27/20 1015  WBC  --  13.0*  --  11.1* 11.0* 8.7  CREATININE 7.66* 12.10*  --  8.61*  --  8.17*  LATICACIDVEN  --   --  0.8  --   --   --     Estimated Creatinine Clearance: 11.4 mL/min (A) (by C-G formula based on SCr of 8.17 mg/dL (H)).    No Known Allergies  Antimicrobials this admission: Azithromycin 9/11>>9/13 CTX 9/11>>9/20 Bactrim 9/14 >> (for PJP prophylaxis) Vancomycin 9/20 >> Cefepime 9/20 >>  Dose adjustments this admission: Adjust vancomycin dose per documented weight  Microbiology results: 9/11 BCx: Streptococcus pneumoniae (sensitive to levo, vanc, PCN, and CTX) 9/21 BCx: NGTD 9/21 MRSA PCR: Neg  Thank you for allowing pharmacy to be a part of this patients care.  Jacobo Forest PharmD Candidate 2022 06/27/2020 11:44 AM

## 2020-06-27 NOTE — Progress Notes (Signed)
Patient clinically stable post CT pigtail 14 FR placed, tolerated well. Vitals stable pre and post procedure. Denies complaints at this time. Awake/alert and oriented post procedure. Report given to [facility] Main RN on [place] with questions answered. Dressing d/i to right CT site.

## 2020-06-27 NOTE — Procedures (Signed)
Interventional Radiology Procedure Note  Procedure: CT guided right chest tube placement  Findings: Please refer to procedural dictation for full description. Successful placement of right posterior 14 Fr pigtail drainage catheter.  Translucent, slightly blood-tinged pleural fluid aspirated.  Sample sent for CBC with diff, glucose, total protein, and gram stain/culture as per Dr. Genevive Bi request.    Complications: None immediate  Estimated Blood Loss: <10 mL  Recommendations: Will order CXR s/p thoracostomy placement as new baseline. Follow up fluid studies as ordered. IR will follow.   Ruthann Cancer, MD

## 2020-06-27 NOTE — Progress Notes (Addendum)
[facility]  ROUNDING NOTE   Subjective:   Patient seen in dialysis, tolerating treatment well. Patient continues to be tachycardic with HR in low 100's, denies worsening SOB. Waiting for chest tube placement today for right Pleural effusion.  Objective:  Vital signs in last 24 hours:  Temp:  [98.6 F (37 C)-99.4 F (37.4 C)] 99.4 F (37.4 C) (09/22 0950) Pulse Rate:  [91-103] 103 (09/22 1100) Resp:  [16-20] 17 (09/22 1100) BP: (111-139)/(73-97) 122/93 (09/22 1100) SpO2:  [90 %-99 %] 99 % (09/22 1100)  Weight change:  Filed Weights   06/16/20 2228  Weight: 60.8 kg    Intake/Output: I/O last 3 completed shifts: In: 1259.7 [P.O.:60; I.V.:370; Blood:301; IV Piggyback:528.7] Out: 375 [Urine:375]   Intake/Output this shift:  Total I/O In: 0  Out: 175 [Urine:175]  Physical Exam: General: Awake, alert,receiving dialysis treatment  Head:  oral mucosal membranes moist  Eyes: Anicteric  Neck: Supple  Lungs:  Normal and symmetrical effort,Lungs clear  Heart: Tachycardic with HR in low 100's  Abdomen:  Nontender, non distended  Extremities:  No peripheral edema.  Neurologic: oriented x3  Skin: No acute rashes or lesions  Access: RIJ permcath     Basic Metabolic Panel: Recent Labs  Lab 06/22/20 0447 06/23/20 0911 06/23/20 0911 06/25/20 0812 06/26/20 1012 06/27/20 1015  NA  --  130*  --  127* 131* 130*  K  --  4.1  --  4.7 4.2 4.4  CL  --  92*  --  90* 94* 93*  CO2  --  27  --  21* 24 24  GLUCOSE  --  111*  --  93 153* 94  BUN  --  40*  --  78* 46* 46*  CREATININE  --  7.66*  --  12.10* 8.61* 8.17*  CALCIUM  --  7.7*   < > 7.7* 7.8* 8.4*  PHOS 8.0* 7.0*  --   --   --  6.2*   < > = values in this interval not displayed.    Liver Function Tests: Recent Labs  Lab 06/23/20 0911 06/26/20 1012 06/27/20 1015  AST  --  32  --   ALT  --  29  --   ALKPHOS  --  70  --   BILITOT  --  0.6  --   PROT  --  7.1  --   ALBUMIN 2.2* 2.1* 2.1*   No  results for input(s): LIPASE, AMYLASE in the last 168 hours. No results for input(s): AMMONIA in the last 168 hours.  CBC: Recent Labs  Lab 06/24/20 0807 06/25/20 0812 06/26/20 1012 06/27/20 0630 06/27/20 1015  WBC  --  13.0* 11.1* 11.0* 8.7  NEUTROABS  --  9.9* 8.4* 7.9*  --   HGB 7.0* 7.0* 7.7* 7.6* 7.7*  HCT 21.7* 21.4* 23.4* 23.1* 23.5*  MCV  --  83.6 84.2 84.9 83.6  PLT  --  345 372 420* 407*    Cardiac Enzymes: No results for input(s): CKTOTAL, CKMB, CKMBINDEX, TROPONINI in the last 168 hours.  BNP: Invalid input(s): POCBNP  CBG: No results for input(s): GLUCAP in the last 168 hours.  Microbiology: Results for orders placed or performed during the hospital encounter of 06/16/20  SARS Coronavirus 2 by RT PCR (hospital order, performed in [facility] hospital lab) Nasopharyngeal Nasopharyngeal Swab     Status: None   Collection Time: 06/16/20  8:32 AM   Specimen: Nasopharyngeal Swab  Result Value Ref Range Status  SARS Coronavirus 2 NEGATIVE NEGATIVE Final    Comment: (NOTE) SARS-CoV-2 target nucleic acids are NOT DETECTED.  The SARS-CoV-2 RNA is generally detectable in upper and lower respiratory specimens during the acute phase of infection. The lowest concentration of SARS-CoV-2 viral copies this assay can detect is 250 copies / mL. A negative result does not preclude SARS-CoV-2 infection and should not be used as the sole basis for treatment or other patient management decisions.  A negative result may occur with improper specimen collection / handling, submission of specimen other than nasopharyngeal swab, presence of viral mutation(s) within the areas targeted by this assay, and inadequate number of viral copies (<250 copies / mL). A negative result must be combined with clinical observations, patient history, and epidemiological information.  Fact Sheet for Patients:   StrictlyIdeas.no  Fact Sheet for Healthcare  Providers: BankingDealers.co.za  This test is not yet approved or  cleared by the [place] FDA and has been authorized for detection and/or diagnosis of SARS-CoV-2 by FDA under an Emergency Use Authorization (EUA).  This EUA will remain in effect (meaning this test can be used) for the duration of the COVID-19 declaration under Section 564(b)(1) of the Act, 21 U.S.C. section 360bbb-3(b)(1), unless the authorization is terminated or revoked sooner.  Performed at [facility], [address], [place], [place] [address]   Blood Culture (routine x 2)     Status: Abnormal   Collection Time: 06/16/20  8:32 AM   Specimen: BLOOD  Result Value Ref Range Status   Specimen Description   Final    BLOOD LEFT FORE ARM Performed at [facility], [address] [place], [place] [address]    Special Requests   Final    BOTTLES DRAWN AEROBIC AND ANAEROBIC Blood Culture results may not be optimal due to an inadequate volume of blood received in culture bottles Performed at [facility], [address] [place], [place] [address]    Culture  Setup Time   Final    GRAM POSITIVE COCCI IN BOTH AEROBIC AND ANAEROBIC BOTTLES CRITICAL VALUE NOTED.  VALUE IS CONSISTENT WITH PREVIOUSLY REPORTED AND CALLED VALUE. Performed at [facility], [address] [place], [place] [address]    Culture (A)  Final    STREPTOCOCCUS PNEUMONIAE SUSCEPTIBILITIES PERFORMED ON PREVIOUS CULTURE WITHIN THE LAST 5 DAYS. Performed at [facility], [place] [address], [place], [place] [address]    Report Status 06/19/2020 FINAL  Final  Blood Culture (routine x 2)     Status: Abnormal   Collection Time: 06/16/20  8:32 AM   Specimen: BLOOD  Result Value Ref Range Status   Specimen Description   Final    BLOOD LEFT FORE ARM Performed at [facility], [address] [place], [place] [address]    Special Requests   Final    BOTTLES DRAWN AEROBIC  AND ANAEROBIC Blood Culture results may not be optimal due to an inadequate volume of blood received in culture bottles Performed at [facility], [address] [place], [place] [address]    Culture  Setup Time   Final    Organism ID to follow [place] CRITICAL RESULT CALLED TO, READ BACK BY AND VERIFIED WITH: [place] 06/16/20 AT 2148 HS Performed at [facility], [place] [address] [place],  [address]    Culture STREPTOCOCCUS PNEUMONIAE (A)  Final   Report Status 06/19/2020 FINAL  Final  Organism ID, Bacteria STREPTOCOCCUS PNEUMONIAE  Final      Susceptibility   Streptococcus pneumoniae - MIC*    ERYTHROMYCIN >=8 RESISTANT Resistant     LEVOFLOXACIN 0.5 SENSITIVE Sensitive     VANCOMYCIN 0.5 SENSITIVE Sensitive     PENICILLIN (meningitis) 1 RESISTANT Resistant     PENO - penicillin 1      PENICILLIN (non-meningitis) 1 SENSITIVE Sensitive     PENICILLIN (oral) 1 INTERMEDIATE Intermediate     CEFTRIAXONE (non-meningitis) 1 SENSITIVE Sensitive     CEFTRIAXONE (meningitis) 1 INTERMEDIATE Intermediate     * STREPTOCOCCUS PNEUMONIAE  Blood Culture ID Panel (Reflexed)     Status: Abnormal   Collection Time: 06/16/20  8:32 AM  Result Value Ref Range Status   Enterococcus faecalis NOT DETECTED NOT DETECTED Final   Enterococcus Faecium NOT DETECTED NOT DETECTED Final   Listeria monocytogenes NOT DETECTED NOT DETECTED Final   Staphylococcus species NOT DETECTED NOT DETECTED Final   Staphylococcus aureus (BCID) NOT DETECTED NOT DETECTED Final   Staphylococcus epidermidis NOT DETECTED NOT DETECTED Final   Staphylococcus lugdunensis NOT DETECTED NOT DETECTED Final   Streptococcus species DETECTED (A) NOT DETECTED Final    Comment: CRITICAL RESULT CALLED TO, READ BACK BY AND VERIFIED WITH: DAVID BESANTI 06/16/20 AT 2148 HS    Streptococcus agalactiae NOT DETECTED NOT DETECTED Final   Streptococcus pneumoniae  DETECTED (A) NOT DETECTED Final    Comment: CRITICAL RESULT CALLED TO, READ BACK BY AND VERIFIED WITH: DAVID BESANTI 06/16/20 AT 2148 HS    Streptococcus pyogenes NOT DETECTED NOT DETECTED Final   A.calcoaceticus-baumannii NOT DETECTED NOT DETECTED Final   Bacteroides fragilis NOT DETECTED NOT DETECTED Final   Enterobacterales NOT DETECTED NOT DETECTED Final   Enterobacter cloacae complex NOT DETECTED NOT DETECTED Final   Escherichia coli NOT DETECTED NOT DETECTED Final   Klebsiella aerogenes NOT DETECTED NOT DETECTED Final   Klebsiella oxytoca NOT DETECTED NOT DETECTED Final   Klebsiella pneumoniae NOT DETECTED NOT DETECTED Final   Proteus species NOT DETECTED NOT DETECTED Final   Salmonella species NOT DETECTED NOT DETECTED Final   Serratia marcescens NOT DETECTED NOT DETECTED Final   Haemophilus influenzae NOT DETECTED NOT DETECTED Final   Neisseria meningitidis NOT DETECTED NOT DETECTED Final   Pseudomonas aeruginosa NOT DETECTED NOT DETECTED Final   Stenotrophomonas maltophilia NOT DETECTED NOT DETECTED Final   Candida albicans NOT DETECTED NOT DETECTED Final   Candida auris NOT DETECTED NOT DETECTED Final   Candida glabrata NOT DETECTED NOT DETECTED Final   Candida krusei NOT DETECTED NOT DETECTED Final   Candida parapsilosis NOT DETECTED NOT DETECTED Final   Candida tropicalis NOT DETECTED NOT DETECTED Final   Cryptococcus neoformans/gattii NOT DETECTED NOT DETECTED Final    Comment: Performed at [facility], [address] [place], [place] [address]  CULTURE, BLOOD (ROUTINE X 2) w Reflex to ID Panel     Status: None (Preliminary result)   Collection Time: 06/25/20  7:13 PM   Specimen: BLOOD  Result Value Ref Range Status   Specimen Description BLOOD LEFT ANTECUBITAL  Final   Special Requests   Final    BOTTLES DRAWN AEROBIC AND ANAEROBIC Blood Culture adequate volume   Culture   Final    NO GROWTH 2 DAYS Performed at [facility], [address] [place], [place] [address]    Report Status PENDING  Incomplete  CULTURE, BLOOD (ROUTINE X 2) w Reflex to ID Panel     Status:  None (Preliminary result)   Collection Time: 06/25/20  7:27 PM   Specimen: BLOOD  Result Value Ref Range Status   Specimen Description BLOOD BLOOD LEFT HAND  Final   Special Requests   Final    BOTTLES DRAWN AEROBIC AND ANAEROBIC Blood Culture adequate volume   Culture   Final    NO GROWTH 2 DAYS Performed at [facility], [address] [place], [place] [address]    Report Status PENDING  Incomplete  MRSA PCR Screening     Status: None   Collection Time: 06/26/20  8:50 PM   Specimen: Nasal Mucosa; Nasopharyngeal  Result Value Ref Range Status   MRSA by PCR NEGATIVE NEGATIVE Final    Comment:        The GeneXpert MRSA Assay (FDA approved for NASAL specimens only), is one component of a comprehensive MRSA colonization surveillance program. It is not intended to diagnose MRSA infection nor to guide or monitor treatment for MRSA infections. Performed at [facility], [address] [place], [place] [address]     Coagulation Studies: No results for input(s): LABPROT, INR in the last 72 hours.  Urinalysis: No results for input(s): COLORURINE, LABSPEC, PHURINE, GLUCOSEU, HGBUR, BILIRUBINUR, KETONESUR, PROTEINUR, UROBILINOGEN, NITRITE, LEUKOCYTESUR in the last 72 hours.  Invalid input(s): APPERANCEUR    Imaging: CT ANGIO CHEST PE W OR WO CONTRAST  Result Date: 06/25/2020 CLINICAL DATA:  Tachycardia EXAM: CT ANGIOGRAPHY CHEST WITH CONTRAST TECHNIQUE: Multidetector CT imaging of the chest was performed using the standard protocol during bolus administration of intravenous contrast. Multiplanar CT image reconstructions and MIPs were obtained to evaluate the vascular anatomy. CONTRAST:  37mL OMNIPAQUE IOHEXOL 350 MG/ML SOLN COMPARISON:  Chest x-ray from earlier in the same day. FINDINGS: Cardiovascular: Thoracic aorta and its branches  are well visualize without aneurysmal dilatation or dissection. Heart is enlarged in size similar to that seen on prior plain film examination. The pulmonary artery is well visualized within normal branching pattern. No filling defects to suggest pulmonary emboli are noted. Mediastinum/Nodes: Thoracic inlet is within normal limits. No sizable hilar or mediastinal adenopathy is noted. The esophagus as visualized is within normal limits. Lungs/Pleura: Large partially loculated right-sided pleural effusion with right lower lobe consolidation. Some ground-glass opacity is noted within the right upper lobe as well. Loculated fluid in the minor fissure is seen. Patchy airspace opacity is noted within the left upper lobe consistent with that seen on recent chest x-ray consistent with multifocal pneumonia. No left-sided pleural effusion is seen. No pneumothorax is noted. Upper Abdomen: Visualized upper abdomen shows no acute abnormality. Musculoskeletal: No acute bony abnormality is seen. Review of the MIP images confirms the above findings. IMPRESSION: Multifocal infiltrates in the upper lobes bilaterally. Large right-sided pleural effusion which is at least partially loculated with associated right lower lobe collapse. This is also felt to be related to underlying pneumonia. No evidence of pulmonary emboli. Electronically Signed   By: Inez Catalina M.D.   On: 06/25/2020 22:11   DG Chest Port 1 View  Result Date: 06/25/2020 CLINICAL DATA:  Fever and cough. EXAM: PORTABLE CHEST 1 VIEW COMPARISON:  06/16/20 FINDINGS: There is a right chest wall dialysis catheter with tips at the cavoatrial junction. Cardiac enlargement. Interval development of moderate to large right pleural effusion which may be partially loculated with fluid accumulation along the minor fissure. Veil like opacification of the right midlung and right base noted. Patchy airspace densities are noted within the left mid lung and left  lower lobe. IMPRESSION:  1. Interval development of moderate to large right pleural effusion which may be partially loculated with fluid accumulation along the minor fissure. 2. Left mid lung and left lower lobe airspace densities concerning for amultifocal infection. Electronically Signed   By: Kerby Moors M.D.   On: 06/25/2020 19:23     Medications:   . sodium chloride Stopped (06/26/20 2217)  . ceFEPime (MAXIPIME) IV Stopped (06/26/20 2113)   . amLODipine  10 mg Oral Daily  . carvedilol  25 mg Oral BID WC  . Chlorhexidine Gluconate Cloth  6 each Topical Q0600  . Darunavir-Cobicisctat-Emtricitabine-Tenofovir Alafenamide  1 tablet Oral Q supper  . feeding supplement (NEPRO CARB STEADY)  237 mL Oral BID BM  . hydrALAZINE  50 mg Oral Q8H  . losartan  100 mg Oral Daily  . multivitamin  1 tablet Oral QHS  . sulfamethoxazole-trimethoprim  1 tablet Oral Daily  . vancomycin  500 mg Intravenous Q M,W,F-HD   sodium chloride, acetaminophen, chlorpheniramine-HYDROcodone, guaiFENesin, hydrALAZINE, labetalol, morphine injection, ondansetron (ZOFRAN) IV, oxyCODONE-acetaminophen, promethazine  Assessment/ Plan:  Mr. Alger Kerstein is a 29 y.o.  male with a PMHx oflongstanding hypertension, ESRD not yet on dialysis, anemia of chronic kidney disease, secondary hyperparathyroidism,HIV,who was [facility] to [facility] on[facility]/11/2021for evaluation of shortness of breath and chest pain.   1. ESRDinitiated on dialysis this admission Patient was last seen by nephrologist in [place] approximately 1 year ago. 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 Kidney is atrophic on renal ultrasound.  -Receiving dialysis today - continue MWF schedule  -Outpatient hemodialysis center placement at  [place].   2. Hypertension. Blood pressure readings within acceptable range with current regimen of antihypertensives  3. Anemia of chronic kidney disease. -Hgb 7.7 today -Received PRBC transfusion during this admission - Continue Epogen with dilaysis - Avoid further blood transfusions if possible; due to possibility of a future transplant    4. Secondary Hyperparathyroidism: PTH 645 on this admission.  - Phosphorus 6.2 today -Will start on Sevelamer with meals      LOS: 11 Princy Raju 9/22/202111:27 AM   Patient was seen and examined with Crosby Oyster, DNP. Additionally,  Resume epo with HD treatments Blood pressure controlled with amlodipine, hydralazine and losartan. Patient is scheduled for chest tube placement for empyema later today.  Seen and examined on hemodialysis treatment.  Above plan was discussed and agreed upon on the signing of this note.   Lavonia Dana, [facility]  9/22/202112:44 PM

## 2020-06-27 NOTE — Progress Notes (Signed)
PROGRESS NOTE    Daniel Combs  VXY:[id number] DOB: October 10, 1990 DOA: 06/16/2020 PCP: Pcp, No    Brief Narrative:  29 year old male with history of untreated HIV, hypertension, untreated chronic kidney disease progressing to ESRD who was initially admitted on 06/16/2020 with cough and back pain of 1 week duration.  Has not been adherent to antihypertensive or HIV treatment.  Blood cultures found to be positive for Streptococcus pneumonia.  Associated large right-sided empyema.  Infectious disease, nephrology, cardiothoracic surgery, interventional radiology on consult.  Initially met for sepsis secondary to community-acquired pneumonia.  Completed 10 days of IV Rocephin.  This is when the repeat chest CT showed loculated right pleural effusion.  Antibiotics expanded.  Nephrology consulted for ESRD.  Vascular surgery placed tunneled hemodialysis catheter 06/18/2020.  New start hemodialysis this admission.  Patient pending IR placement of right-sided pleural drain per cardiothoracic surgery request.   Assessment & Plan:   Principal Problem:   Sepsis ([place]) Active Problems:   CAP (community acquired pneumonia)   Hypertension   ESRD (end stage renal disease) ([place])   Hyponatremia   Hyperkalemia   Elevated troponin   Anemia in ESRD (end-stage renal disease) ([place])   Bacteremia due to Streptococcus pneumoniae   Pleural effusion, right  Sepsis secondary to Streptococcus pneumonia bacteremia Complex loculated right pleural effusion secondary to above Plan: Cardiothoracic surgery following Recommend IR guided placement of right-sided pigtail catheter Currently scheduled for today 06/27/2020 Continue broad-spectrum IV antibiotics until the time  AIDS Patient has been nonadherent to his HIV treatment started on Symtuza during this admission On Bactrim for P JP prophylaxis Infectious disease following  Accelerated hypertension Reasonable control on 4 medications  End-stage renal  disease on hemodialysis New HD start this admission Nephrology consult following for HD needs TOC currently looking for outpatient HD chair   DVT prophylaxis: Subcu heparin Code Status: Full Family Communication: None today Disposition Plan: Status is: Inpatient  Remains inpatient appropriate because:Inpatient level of care appropriate due to severity of illness   Dispo: The patient is from: Home              Anticipated d/c is to: Home              Anticipated d/c date is: > 3 days              Patient currently is not medically stable to d/c.   Scheduled for IR guided right-sided chest drain 06/27/2020.  Disposition plan unclear at this time.  Anticipate several additional days of inpatient monitoring prior to disposition planning.  Will also need arrangement of outpatient hemodialysis chair prior to discharge.  This may present challenges the patient from [place].   Consultants:   Nephrology  Infectious disease Cardiothoracic surgery Interventional radiology  Procedures:   Right-sided chest tube, scheduled 06/27/2020  Antimicrobials:   Vancomycin  Cefepime   Subjective: Seen and examined.  Startled when I woke him this morning.  Notes some tightness in his chest.  Objective: Vitals:   06/27/20 1215 06/27/20 1230 06/27/20 1245 06/27/20 1255  BP: 116/81 109/82 108/80 115/76  Pulse: 100 (!) 105  100  Resp: '18 15 18 18  ' Temp:    99 F (37.2 C)  TempSrc:    Oral  SpO2: 100% 100% 99% 100%  Weight:      Height:        Intake/Output Summary (Last 24 hours) at 06/27/2020 1352 Last data filed at 06/27/2020 1255 Gross per 24 hour  Intake 140.73  ml  Output 2550 ml  Net -2409.27 ml   Filed Weights   06/16/20 2228  Weight: 60.8 kg    Examination:  General exam: Appears calm and comfortable  Respiratory system: Decreased breath sounds on right.  Diffuse crackles, greater on right.  Normal work of breathing.  Room air Cardiovascular system: S1 & S2 heard,  RRR. No JVD, murmurs, rubs, gallops or clicks. No pedal edema. Gastrointestinal system: Abdomen is nondistended, soft and nontender. No organomegaly or masses felt. Normal bowel sounds heard. Central nervous system: Alert and oriented. No focal neurological deficits. Extremities: Symmetric 5 x 5 power. Skin: No rashes, lesions or ulcers Psychiatry: Judgement and insight appear normal. Mood & affect appropriate.     Data Reviewed: I have personally reviewed following labs and imaging studies  CBC: Recent Labs  Lab 06/24/20 0807 06/25/20 0812 06/26/20 1012 06/27/20 0630 06/27/20 1015  WBC  --  13.0* 11.1* 11.0* 8.7  NEUTROABS  --  9.9* 8.4* 7.9*  --   HGB 7.0* 7.0* 7.7* 7.6* 7.7*  HCT 21.7* 21.4* 23.4* 23.1* 23.5*  MCV  --  83.6 84.2 84.9 83.6  PLT  --  345 372 420* 450*   Basic Metabolic Panel: Recent Labs  Lab 06/22/20 0447 06/23/20 0911 06/25/20 0812 06/26/20 1012 06/27/20 1015  NA  --  130* 127* 131* 130*  K  --  4.1 4.7 4.2 4.4  CL  --  92* 90* 94* 93*  CO2  --  27 21* 24 24  GLUCOSE  --  111* 93 153* 94  BUN  --  40* 78* 46* 46*  CREATININE  --  7.66* 12.10* 8.61* 8.17*  CALCIUM  --  7.7* 7.7* 7.8* 8.4*  PHOS 8.0* 7.0*  --   --  6.2*   GFR: Estimated Creatinine Clearance: 11.4 mL/min (A) (by C-G formula based on SCr of 8.17 mg/dL (H)). Liver Function Tests: Recent Labs  Lab 06/23/20 0911 06/26/20 1012 06/27/20 1015  AST  --  32  --   ALT  --  29  --   ALKPHOS  --  70  --   BILITOT  --  0.6  --   PROT  --  7.1  --   ALBUMIN 2.2* 2.1* 2.1*   No results for input(s): LIPASE, AMYLASE in the last 168 hours. No results for input(s): AMMONIA in the last 168 hours. Coagulation Profile: Recent Labs  Lab 06/23/20 0032  INR 1.1   Cardiac Enzymes: No results for input(s): CKTOTAL, CKMB, CKMBINDEX, TROPONINI in the last 168 hours. BNP (last 3 results) No results for input(s): PROBNP in the last 8760 hours. HbA1C: No results for input(s): HGBA1C in the  last 72 hours. CBG: No results for input(s): GLUCAP in the last 168 hours. Lipid Profile: No results for input(s): CHOL, HDL, LDLCALC, TRIG, CHOLHDL, LDLDIRECT in the last 72 hours. Thyroid Function Tests: No results for input(s): TSH, T4TOTAL, FREET4, T3FREE, THYROIDAB in the last 72 hours. Anemia Panel: No results for input(s): VITAMINB12, FOLATE, FERRITIN, TIBC, IRON, RETICCTPCT in the last 72 hours. Sepsis Labs: Recent Labs  Lab 06/25/20 1911 06/25/20 1927 06/26/20 0518 06/27/20 0630  PROCALCITON 11.77  --  9.18 <0.10  LATICACIDVEN  --  0.8  --   --     Recent Results (from the past 240 hour(s))  CULTURE, BLOOD (ROUTINE X 2) w Reflex to ID Panel     Status: None (Preliminary result)   Collection Time: 06/25/20  7:13 PM  Specimen: BLOOD  Result Value Ref Range Status   Specimen Description BLOOD LEFT ANTECUBITAL  Final   Special Requests   Final    BOTTLES DRAWN AEROBIC AND ANAEROBIC Blood Culture adequate volume   Culture   Final    NO GROWTH 2 DAYS Performed at [facility], [address], [place], [place] [address]    Report Status PENDING  Incomplete  CULTURE, BLOOD (ROUTINE X 2) w Reflex to ID Panel     Status: None (Preliminary result)   Collection Time: 06/25/20  7:27 PM   Specimen: BLOOD  Result Value Ref Range Status   Specimen Description BLOOD BLOOD LEFT HAND  Final   Special Requests   Final    BOTTLES DRAWN AEROBIC AND ANAEROBIC Blood Culture adequate volume   Culture   Final    NO GROWTH 2 DAYS Performed at [facility], [address] [place], [place] [address]    Report Status PENDING  Incomplete  MRSA PCR Screening     Status: None   Collection Time: 06/26/20  8:50 PM   Specimen: Nasal Mucosa; Nasopharyngeal  Result Value Ref Range Status   MRSA by PCR NEGATIVE NEGATIVE Final    Comment:        The GeneXpert MRSA Assay (FDA approved for NASAL specimens only), is one component of a comprehensive MRSA  colonization surveillance program. It is not intended to diagnose MRSA infection nor to guide or monitor treatment for MRSA infections. Performed at [facility], [address] [place], [place] [address]          Radiology Studies: CT ANGIO CHEST PE W OR WO CONTRAST  Result Date: 06/25/2020 CLINICAL DATA:  Tachycardia EXAM: CT ANGIOGRAPHY CHEST WITH CONTRAST TECHNIQUE: Multidetector CT imaging of the chest was performed using the standard protocol during bolus administration of intravenous contrast. Multiplanar CT image reconstructions and MIPs were obtained to evaluate the vascular anatomy. CONTRAST:  64m OMNIPAQUE IOHEXOL 350 MG/ML SOLN COMPARISON:  Chest x-ray from earlier in the same day. FINDINGS: Cardiovascular: Thoracic aorta and its branches are well visualize without aneurysmal dilatation or dissection. Heart is enlarged in size similar to that seen on prior plain film examination. The pulmonary artery is well visualized within normal branching pattern. No filling defects to suggest pulmonary emboli are noted. Mediastinum/Nodes: Thoracic inlet is within normal limits. No sizable hilar or mediastinal adenopathy is noted. The esophagus as visualized is within normal limits. Lungs/Pleura: Large partially loculated right-sided pleural effusion with right lower lobe consolidation. Some ground-glass opacity is noted within the right upper lobe as well. Loculated fluid in the minor fissure is seen. Patchy airspace opacity is noted within the left upper lobe consistent with that seen on recent chest x-ray consistent with multifocal pneumonia. No left-sided pleural effusion is seen. No pneumothorax is noted. Upper Abdomen: Visualized upper abdomen shows no acute abnormality. Musculoskeletal: No acute bony abnormality is seen. Review of the MIP images confirms the above findings. IMPRESSION: Multifocal infiltrates in the upper lobes bilaterally. Large right-sided pleural effusion which  is at least partially loculated with associated right lower lobe collapse. This is also felt to be related to underlying pneumonia. No evidence of pulmonary emboli. Electronically Signed   By: MInez CatalinaM.D.   On: 06/25/2020 22:11   DG Chest Port 1 View  Result Date: 06/25/2020 CLINICAL DATA:  Fever and cough. EXAM: PORTABLE CHEST 1 VIEW COMPARISON:  06/16/20 FINDINGS: There is a right chest wall dialysis catheter with tips  at the cavoatrial junction. Cardiac enlargement. Interval development of moderate to large right pleural effusion which may be partially loculated with fluid accumulation along the minor fissure. Veil like opacification of the right midlung and right base noted. Patchy airspace densities are noted within the left mid lung and left lower lobe. IMPRESSION: 1. Interval development of moderate to large right pleural effusion which may be partially loculated with fluid accumulation along the minor fissure. 2. Left mid lung and left lower lobe airspace densities concerning for amultifocal infection. Electronically Signed   By: Kerby Moors M.D.   On: 06/25/2020 19:23        Scheduled Meds: . amLODipine  10 mg Oral Daily  . carvedilol  25 mg Oral BID WC  . Chlorhexidine Gluconate Cloth  6 each Topical Q0600  . Darunavir-Cobicisctat-Emtricitabine-Tenofovir Alafenamide  1 tablet Oral Q supper  . [START ON 06/29/2020] epoetin (EPOGEN/PROCRIT) injection  10,000 Units Intravenous Q M,W,F-HD  . feeding supplement (NEPRO CARB STEADY)  237 mL Oral BID BM  . hydrALAZINE  50 mg Oral Q8H  . losartan  100 mg Oral Daily  . multivitamin  1 tablet Oral QHS  . sevelamer carbonate  1,600 mg Oral TID WC  . sulfamethoxazole-trimethoprim  1 tablet Oral Daily   Continuous Infusions: . sodium chloride Stopped (06/26/20 2217)  . ceFEPime (MAXIPIME) IV Stopped (06/26/20 2113)  . vancomycin       LOS: 11 days    Time spent: 25 minutes    Sidney Ace, MD [facility] Pager  [phone]  If 7PM-7AM, please contact night-coverage 06/27/2020, 1:52 PM

## 2020-06-28 LAB — CBC WITH DIFFERENTIAL/PLATELET
Abs Immature Granulocytes: 0.07 10*3/uL (ref 0.00–0.07)
Basophils Absolute: 0.1 10*3/uL (ref 0.0–0.1)
Basophils Relative: 0 %
Eosinophils Absolute: 0.2 10*3/uL (ref 0.0–0.5)
Eosinophils Relative: 2 %
HCT: 23.5 % — ABNORMAL LOW (ref 39.0–52.0)
Hemoglobin: 7.8 g/dL — ABNORMAL LOW (ref 13.0–17.0)
Immature Granulocytes: 1 %
Lymphocytes Relative: 7 %
Lymphs Abs: 0.8 10*3/uL (ref 0.7–4.0)
MCH: 27.7 pg (ref 26.0–34.0)
MCHC: 33.2 g/dL (ref 30.0–36.0)
MCV: 83.3 fL (ref 80.0–100.0)
Monocytes Absolute: 1.9 10*3/uL — ABNORMAL HIGH (ref 0.1–1.0)
Monocytes Relative: 16 %
Neutro Abs: 8.6 10*3/uL — ABNORMAL HIGH (ref 1.7–7.7)
Neutrophils Relative %: 74 %
Platelets: 448 10*3/uL — ABNORMAL HIGH (ref 150–400)
RBC: 2.82 MIL/uL — ABNORMAL LOW (ref 4.22–5.81)
RDW: 14.5 % (ref 11.5–15.5)
WBC: 11.6 10*3/uL — ABNORMAL HIGH (ref 4.0–10.5)
nRBC: 0 % (ref 0.0–0.2)

## 2020-06-28 LAB — BASIC METABOLIC PANEL
Anion gap: 13 (ref 5–15)
BUN: 38 mg/dL — ABNORMAL HIGH (ref 6–20)
CO2: 25 mmol/L (ref 22–32)
Calcium: 8.3 mg/dL — ABNORMAL LOW (ref 8.9–10.3)
Chloride: 95 mmol/L — ABNORMAL LOW (ref 98–111)
Creatinine, Ser: 7.85 mg/dL — ABNORMAL HIGH (ref 0.61–1.24)
GFR calc Af Amer: 10 mL/min — ABNORMAL LOW (ref >60)
GFR calc non Af Amer: 8 mL/min — ABNORMAL LOW (ref >60)
Glucose, Bld: 95 mg/dL (ref 70–99)
Potassium: 5.4 mmol/L — ABNORMAL HIGH (ref 3.5–5.1)
Sodium: 133 mmol/L — ABNORMAL LOW (ref 135–145)

## 2020-06-28 LAB — BPAM RBC
Blood Product Expiration Date: 202109252359
Blood Product Expiration Date: 202110112359
Blood Product Expiration Date: 202110172359
ISSUE DATE / TIME: 202109180222
ISSUE DATE / TIME: 202109201520
ISSUE DATE / TIME: 202109210034
Unit Type and Rh: 600
Unit Type and Rh: 6200
Unit Type and Rh: 6200

## 2020-06-28 LAB — TYPE AND SCREEN
ABO/RH(D): A POS
Antibody Screen: NEGATIVE
Unit division: 0
Unit division: 0
Unit division: 0

## 2020-06-28 LAB — PATHOLOGIST SMEAR REVIEW

## 2020-06-28 MED ORDER — MORPHINE SULFATE (PF) 2 MG/ML IV SOLN: 2.0000 mg | INTRAVENOUS | Status: DC | PRN

## 2020-06-28 MED ORDER — MORPHINE SULFATE (PF) 2 MG/ML IV SOLN
2.0000 mg | Freq: Once | INTRAVENOUS | Status: AC
  Administered 2020-06-28: 2 mg via INTRAVENOUS
  Filled 2020-06-28: qty 1

## 2020-06-28 MED ORDER — SODIUM CHLORIDE (PF) 0.9 % IJ SOLN
Freq: Once | INTRAMUSCULAR | Status: AC
  Filled 2020-06-28: qty 10

## 2020-06-28 NOTE — Progress Notes (Addendum)
[facility]  ROUNDING NOTE   Subjective:   Patient sleeping in bed, in no acute distress.Patient has chest tube placed yesterday. He denies SOB, nausea or vomiting.   Objective:  Vital signs in last 24 hours:  Temp:  [98.2 F (36.8 C)-99.8 F (37.7 C)] 98.2 F (36.8 C) (09/23 0508) Pulse Rate:  [97-108] 105 (09/23 0508) Resp:  [16-20] 18 (09/22 2124) BP: (89-118)/(71-76) 114/71 (09/23 0508) SpO2:  [93 %-100 %] 93 % (09/23 0508)  Weight change:  Filed Weights   06/16/20 2228  Weight: 60.8 kg    Intake/Output: I/O last 3 completed shifts: In: 404 [I.V.:54; IV Piggyback:350] Out: 2402 [Urine:375; Other:2000; Chest Tube:27]   Intake/Output this shift:  Total I/O In: -  Out: 16 [Chest Tube:16]  Physical Exam: General: Sleeping on arrival, arousable to call  Head:  oral mucosal membranes moist  Eyes: Anicteric  Neck: Supple  Lungs:  Lungs clear, Rt.side Chest tube in place  Heart: S1S2+ heart rate in 90's  Abdomen:  Nontender, non distended  Extremities:  No peripheral edema.  Neurologic: oriented x3  Skin: No acute rashes or lesions  Access: RIJ permcath     Basic Metabolic Panel: Recent Labs  Lab 06/22/20 0447 06/23/20 0911 06/23/20 0911 06/25/20 0812 06/25/20 0812 06/26/20 1012 06/27/20 1015 06/28/20 0409  NA  --  130*  --  127*  --  131* 130* 133*  K  --  4.1  --  4.7  --  4.2 4.4 5.4*  CL  --  92*  --  90*  --  94* 93* 95*  CO2  --  27  --  21*  --  24 24 25   GLUCOSE  --  111*  --  93  --  153* 94 95  BUN  --  40*  --  78*  --  46* 46* 38*  CREATININE  --  7.66*  --  12.10*  --  8.61* 8.17* 7.85*  CALCIUM  --  7.7*   < > 7.7*   < > 7.8* 8.4* 8.3*  PHOS 8.0* 7.0*  --   --   --   --  6.2*  --    < > = values in this interval not displayed.    Liver Function Tests: Recent Labs  Lab 06/23/20 0911 06/26/20 1012 06/27/20 1015  AST  --  32  --   ALT  --  29  --   ALKPHOS  --  70  --   BILITOT  --  0.6  --   PROT  --  7.1  --    ALBUMIN 2.2* 2.1* 2.1*   No results for input(s): LIPASE, AMYLASE in the last 168 hours. No results for input(s): AMMONIA in the last 168 hours.  CBC: Recent Labs  Lab 06/25/20 0812 06/26/20 1012 06/27/20 0630 06/27/20 1015 06/28/20 0409  WBC 13.0* 11.1* 11.0* 8.7 11.6*  NEUTROABS 9.9* 8.4* 7.9*  --  8.6*  HGB 7.0* 7.7* 7.6* 7.7* 7.8*  HCT 21.4* 23.4* 23.1* 23.5* 23.5*  MCV 83.6 84.2 84.9 83.6 83.3  PLT 345 372 420* 407* 448*    Cardiac Enzymes: No results for input(s): CKTOTAL, CKMB, CKMBINDEX, TROPONINI in the last 168 hours.  BNP: Invalid input(s): POCBNP  CBG: No results for input(s): GLUCAP in the last 168 hours.  Microbiology: Results for orders placed or performed during the hospital encounter of 06/16/20  SARS Coronavirus 2 by RT PCR (hospital order, performed in [facility] hospital  lab) Nasopharyngeal Nasopharyngeal Swab     Status: None   Collection Time: 06/16/20  8:32 AM   Specimen: Nasopharyngeal Swab  Result Value Ref Range Status   SARS Coronavirus 2 NEGATIVE NEGATIVE Final    Comment: (NOTE) SARS-CoV-2 target nucleic acids are NOT DETECTED.  The SARS-CoV-2 RNA is generally detectable in upper and lower respiratory specimens during the acute phase of infection. The lowest concentration of SARS-CoV-2 viral copies this assay can detect is 250 copies / mL. A negative result does not preclude SARS-CoV-2 infection and should not be used as the sole basis for treatment or other patient management decisions.  A negative result may occur with improper specimen collection / handling, submission of specimen other than nasopharyngeal swab, presence of viral mutation(s) within the areas targeted by this assay, and inadequate number of viral copies (<250 copies / mL). A negative result must be combined with clinical observations, patient history, and epidemiological information.  Fact Sheet for Patients:   StrictlyIdeas.no  Fact  Sheet for Healthcare Providers: BankingDealers.co.za  This test is not yet approved or  cleared by the [place] FDA and has been authorized for detection and/or diagnosis of SARS-CoV-2 by FDA under an Emergency Use Authorization (EUA).  This EUA will remain in effect (meaning this test can be used) for the duration of the COVID-19 declaration under Section 564(b)(1) of the Act, 21 U.S.C. section 360bbb-3(b)(1), unless the authorization is terminated or revoked sooner.  Performed at [facility], [address], [place], [place] [address]   Blood Culture (routine x 2)     Status: Abnormal   Collection Time: 06/16/20  8:32 AM   Specimen: BLOOD  Result Value Ref Range Status   Specimen Description   Final    BLOOD LEFT FORE ARM Performed at [facility], [address] [place], [place] [address]    Special Requests   Final    BOTTLES DRAWN AEROBIC AND ANAEROBIC Blood Culture results may not be optimal due to an inadequate volume of blood received in culture bottles Performed at [facility], [address], [place], [place] [address]    Culture  Setup Time   Final    GRAM POSITIVE COCCI IN BOTH AEROBIC AND ANAEROBIC BOTTLES CRITICAL VALUE NOTED.  VALUE IS CONSISTENT WITH PREVIOUSLY REPORTED AND CALLED VALUE. Performed at [facility], [address] [place], [place] [address]    Culture (A)  Final    STREPTOCOCCUS PNEUMONIAE SUSCEPTIBILITIES PERFORMED ON PREVIOUS CULTURE WITHIN THE LAST 5 DAYS. Performed at [facility], [place] [address] [place], [place] [address]    Report Status 06/19/2020 FINAL  Final  Blood Culture (routine x 2)     Status: Abnormal   Collection Time: 06/16/20  8:32 AM   Specimen: BLOOD  Result Value Ref Range Status   Specimen Description   Final    BLOOD LEFT FORE ARM Performed at [facility], [address] [place], [place] [address]    Special Requests   Final     BOTTLES DRAWN AEROBIC AND ANAEROBIC Blood Culture results may not be optimal due to an inadequate volume of blood received in culture bottles Performed at [facility], [address] [place], [place] [address]    Culture  Setup Time   Final    Organism ID to follow [place] CRITICAL RESULT CALLED TO, READ BACK BY AND VERIFIED WITH: DAVID BESANTI 06/16/20 AT 2148  HS Performed at [facility], [place] [address], [place], [place] [address]    Culture STREPTOCOCCUS PNEUMONIAE (A)  Final   Report Status 06/19/2020 FINAL  Final   Organism ID, Bacteria STREPTOCOCCUS PNEUMONIAE  Final      Susceptibility   Streptococcus pneumoniae - MIC*    ERYTHROMYCIN >=8 RESISTANT Resistant     LEVOFLOXACIN 0.5 SENSITIVE Sensitive     VANCOMYCIN 0.5 SENSITIVE Sensitive     PENICILLIN (meningitis) 1 RESISTANT Resistant     PENO - penicillin 1      PENICILLIN (non-meningitis) 1 SENSITIVE Sensitive     PENICILLIN (oral) 1 INTERMEDIATE Intermediate     CEFTRIAXONE (non-meningitis) 1 SENSITIVE Sensitive     CEFTRIAXONE (meningitis) 1 INTERMEDIATE Intermediate     * STREPTOCOCCUS PNEUMONIAE  Blood Culture ID Panel (Reflexed)     Status: Abnormal   Collection Time: 06/16/20  8:32 AM  Result Value Ref Range Status   Enterococcus faecalis NOT DETECTED NOT DETECTED Final   Enterococcus Faecium NOT DETECTED NOT DETECTED Final   Listeria monocytogenes NOT DETECTED NOT DETECTED Final   Staphylococcus species NOT DETECTED NOT DETECTED Final   Staphylococcus aureus (BCID) NOT DETECTED NOT DETECTED Final   Staphylococcus epidermidis NOT DETECTED NOT DETECTED Final   Staphylococcus lugdunensis NOT DETECTED NOT DETECTED Final   Streptococcus species DETECTED (A) NOT DETECTED Final    Comment: CRITICAL RESULT CALLED TO, READ BACK BY AND VERIFIED WITH: DAVID BESANTI 06/16/20 AT 2148 HS    Streptococcus agalactiae NOT DETECTED NOT DETECTED Final    Streptococcus pneumoniae DETECTED (A) NOT DETECTED Final    Comment: CRITICAL RESULT CALLED TO, READ BACK BY AND VERIFIED WITH: DAVID BESANTI 06/16/20 AT 2148 HS    Streptococcus pyogenes NOT DETECTED NOT DETECTED Final   A.calcoaceticus-baumannii NOT DETECTED NOT DETECTED Final   Bacteroides fragilis NOT DETECTED NOT DETECTED Final   Enterobacterales NOT DETECTED NOT DETECTED Final   Enterobacter cloacae complex NOT DETECTED NOT DETECTED Final   Escherichia coli NOT DETECTED NOT DETECTED Final   Klebsiella aerogenes NOT DETECTED NOT DETECTED Final   Klebsiella oxytoca NOT DETECTED NOT DETECTED Final   Klebsiella pneumoniae NOT DETECTED NOT DETECTED Final   Proteus species NOT DETECTED NOT DETECTED Final   Salmonella species NOT DETECTED NOT DETECTED Final   Serratia marcescens NOT DETECTED NOT DETECTED Final   Haemophilus influenzae NOT DETECTED NOT DETECTED Final   Neisseria meningitidis NOT DETECTED NOT DETECTED Final   Pseudomonas aeruginosa NOT DETECTED NOT DETECTED Final   Stenotrophomonas maltophilia NOT DETECTED NOT DETECTED Final   Candida albicans NOT DETECTED NOT DETECTED Final   Candida auris NOT DETECTED NOT DETECTED Final   Candida glabrata NOT DETECTED NOT DETECTED Final   Candida krusei NOT DETECTED NOT DETECTED Final   Candida parapsilosis NOT DETECTED NOT DETECTED Final   Candida tropicalis NOT DETECTED NOT DETECTED Final   Cryptococcus neoformans/gattii NOT DETECTED NOT DETECTED Final    Comment: Performed at [facility], [address] [place], [place] [address]  CULTURE, BLOOD (ROUTINE X 2) w Reflex to ID Panel     Status: None (Preliminary result)   Collection Time: 06/25/20  7:13 PM   Specimen: BLOOD  Result Value Ref Range Status   Specimen Description BLOOD LEFT ANTECUBITAL  Final   Special Requests   Final    BOTTLES DRAWN AEROBIC AND ANAEROBIC Blood Culture adequate volume   Culture   Final    NO GROWTH 3 DAYS Performed at [facility], [address] [place], [place] [address]    Report Status PENDING  Incomplete  CULTURE, BLOOD (ROUTINE X 2) w Reflex to ID Panel     Status: None (Preliminary result)   Collection Time: 06/25/20  7:27 PM   Specimen: BLOOD  Result Value Ref Range Status   Specimen Description BLOOD BLOOD LEFT HAND  Final   Special Requests   Final    BOTTLES DRAWN AEROBIC AND ANAEROBIC Blood Culture adequate volume   Culture   Final    NO GROWTH 3 DAYS Performed at [facility], [address] [place], [place] [address]    Report Status PENDING  Incomplete  MRSA PCR Screening     Status: None   Collection Time: 06/26/20  8:50 PM   Specimen: Nasal Mucosa; Nasopharyngeal  Result Value Ref Range Status   MRSA by PCR NEGATIVE NEGATIVE Final    Comment:        The GeneXpert MRSA Assay (FDA approved for NASAL specimens only), is one component of a comprehensive MRSA colonization surveillance program. It is not intended to diagnose MRSA infection nor to guide or monitor treatment for MRSA infections. Performed at [facility], [address] [place], [place] [address]   Aerobic/Anaerobic Culture (surgical/deep wound)     Status: None (Preliminary result)   Collection Time: 06/27/20  4:47 PM   Specimen: Pleural Fluid  Result Value Ref Range Status   Specimen Description   Final    PLEURAL Performed at [facility], [address], [place], [place] [address]    Special Requests   Final    PLEURAL FLUID CHEST Performed at [facility], [address] [place], [place] [address]    Gram Stain   Final    RARE WBC PRESENT,BOTH PMN AND MONONUCLEAR NO ORGANISMS SEEN    Culture   Final    NO GROWTH < 12 HOURS Performed at [facility], [place] [address], [place], [place] [address]    Report Status PENDING  Incomplete    Coagulation Studies: No results for input(s): LABPROT, INR in the last 72 hours.  Urinalysis: No results for input(s):  COLORURINE, LABSPEC, PHURINE, GLUCOSEU, HGBUR, BILIRUBINUR, KETONESUR, PROTEINUR, UROBILINOGEN, NITRITE, LEUKOCYTESUR in the last 72 hours.  Invalid input(s): APPERANCEUR    Imaging: DG Chest Port 1 View  Result Date: 06/27/2020 CLINICAL DATA:  Status post right-sided pigtail catheter drainage in the pleural space EXAM: PORTABLE CHEST 1 VIEW COMPARISON:  06/25/2020 FINDINGS: Cardiac shadow is enlarged but stable. Dialysis catheter is again seen. New pigtail catheter is noted on the right with decrease in the degree of pleural effusion valve repair with the prior exam. Some loculated components remain primarily laterally and in the minor fissure. Patchy airspace opacities are noted and stable. IMPRESSION: No pneumothorax following drainage catheter placement. Reduction in right-sided pleural effusion is seen. Persistent airspace opacities are seen. Electronically Signed   By: Inez Catalina M.D.   On: 06/27/2020 19:14   CT IMAGE GUIDED DRAINAGE BY PERCUTANEOUS CATHETER  Result Date: 06/28/2020 INDICATION: 29 year old male with history of parapneumonic effusion secondary to pneumonia. Pigtail thoracostomy tube requested by Thoracic Surgery. EXAM: CT IMAGE GUIDED DRAINAGE BY PERCUTANEOUS CATHETER COMPARISON:  Chest CT from 06/25/2020 MEDICATIONS: The patient is currently admitted to the hospital and receiving intravenous antibiotics. The antibiotics were administered within an appropriate time frame prior to the initiation of the procedure. ANESTHESIA/SEDATION: Moderate (conscious) sedation was employed during this procedure. A total of Versed  2 mg and Fentanyl 100 mcg was administered intravenously. Moderate Sedation Time: 14 minutes. The patient's level of consciousness and vital signs were monitored continuously by radiology nursing throughout the procedure under my direct supervision. CONTRAST:  None COMPLICATIONS: None immediate. PROCEDURE: Informed written consent was obtained from the patient after a  discussion of the risks, benefits and alternatives to treatment. The patient was placed supine, partially left lateral decubitus on the CT gantry and a pre procedural CT was performed re-demonstrating the known fluid collection within the right pleural space. The procedure was planned. A timeout was performed prior to the initiation of the procedure. The right posterolateral and inferior thorax was prepped and draped in the usual sterile fashion. The overlying soft tissues were anesthetized with 1% lidocaine with epinephrine. Appropriate trajectory was planned with the use of a 22 gauge spinal needle. An 18 gauge trocar needle was advanced into the pleural space and a short Amplatz super stiff wire was coiled within the collection. Appropriate positioning was confirmed with a limited CT scan. The tract was serially dilated allowing placement of a 14 French all-purpose drainage catheter. Appropriate positioning was confirmed with a limited postprocedural CT scan. A total of approximately 100 ml of translucent, slightly blood tinged straw-colored fluid was aspirated. Samples were sent to the lab for fluid analysis. The tube was connected to a pleurovac and sutured in place. A dressing was placed. The patient tolerated the procedure well without immediate post procedural complication. IMPRESSION: Successful CT guided placement of a 59 French all purpose drain catheter into the posterolateral and inferior right pleural space with aspiration of approximately 100 mL translucent, slightly blood tinged straw-colored fluid. Samples were sent to the laboratory as requested by the ordering clinical team. Electronically Signed   By: Ruthann Cancer MD   On: 06/28/2020 07:47     Medications:   . sodium chloride Stopped (06/27/20 2316)  . ceFEPime (MAXIPIME) IV Stopped (06/27/20 2211)  . vancomycin Stopped (06/27/20 1827)   . amLODipine  10 mg Oral Daily  . carvedilol  25 mg Oral BID WC  . Chlorhexidine Gluconate Cloth   6 each Topical Q0600  . Darunavir-Cobicisctat-Emtricitabine-Tenofovir Alafenamide  1 tablet Oral Q supper  . [START ON 06/29/2020] epoetin (EPOGEN/PROCRIT) injection  10,000 Units Intravenous Q M,W,F-HD  . feeding supplement (NEPRO CARB STEADY)  237 mL Oral BID BM  . hydrALAZINE  50 mg Oral Q8H  . losartan  100 mg Oral Daily  . multivitamin  1 tablet Oral QHS  . sevelamer carbonate  1,600 mg Oral TID WC  . sulfamethoxazole-trimethoprim  1 tablet Oral Daily   sodium chloride, acetaminophen, chlorpheniramine-HYDROcodone, fentaNYL, guaiFENesin, hydrALAZINE, labetalol, midazolam, morphine injection, ondansetron (ZOFRAN) IV, oxyCODONE-acetaminophen, promethazine  Assessment/ Plan:  Mr. Daniel Combs is a 29 y.o.  male with a PMHx oflongstanding hypertension, ESRD not yet on dialysis, anemia of chronic kidney disease, secondary hyperparathyroidism,HIV,who [facility]21for evaluation of shortness of breath and chest pain.   1. ESRDinitiated on dialysis this admission Patient was last seen by nephrologist in [place] approximately 1 year ago. 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 Kidney is atrophic on renal ultrasound.  -Volume and electrolyte status acceptable - continue MWF dialysis schedule  -Outpatient hemodialysis center placement at [facility] [address]   2. Hypertension. Blood pressure readings within low normal  range  Continue current regimen of antihypertensives  3. Anemia of chronic kidney disease. -Hgb 7.8 today -Received PRBC transfusion during this admission - Continue Epogen with dilaysis - Avoid further blood transfusions if possible; due to possibility of a future transplant    4. Secondary  Hyperparathyroidism: PTH 645 on this admission.  -Continue Sevelamer with meals -Will monitor labs       LOS: 12 Princy Raju 9/23/202112:45 PM  Patient was seen and examined with Crosby Oyster, DNP.  Hemodialysis for tomorrow.  Above plan was discussed and agreed upon on the signing of this note.   Lavonia Dana, MD [facility]  9/23/20211:58 PM

## 2020-06-28 NOTE — Progress Notes (Signed)
Date of Admission:  06/16/2020      ID: Daniel Combs is a 29 y.o. male  Principal Problem:   Sepsis ([place]) Active Problems:   CAP (community acquired pneumonia)   Hypertension   ESRD (end stage renal disease) ([place])   Hyponatremia   Hyperkalemia   Elevated troponin   Anemia in ESRD (end-stage renal disease) ([place])   Bacteremia due to Streptococcus pneumoniae   Pleural effusion on right     Rt chest drain placed yesterday  Medications:  . amLODipine  10 mg Oral Daily  . carvedilol  25 mg Oral BID WC  . Chlorhexidine Gluconate Cloth  6 each Topical Q0600  . Darunavir-Cobicisctat-Emtricitabine-Tenofovir Alafenamide  1 tablet Oral Q supper  . [START ON 06/29/2020] epoetin (EPOGEN/PROCRIT) injection  10,000 Units Intravenous Q M,W,F-HD  . feeding supplement (NEPRO CARB STEADY)  237 mL Oral BID BM  . hydrALAZINE  50 mg Oral Q8H  . losartan  100 mg Oral Daily  . multivitamin  1 tablet Oral QHS  . sevelamer carbonate  1,600 mg Oral TID WC  . sulfamethoxazole-trimethoprim  1 tablet Oral Daily    Objective: Vital signs in last 24 hours: Temp:  [98.2 F (36.8 C)-99.8 F (37.7 C)] 98.7 F (37.1 C) (09/23 1323) Pulse Rate:  [96-108] 96 (09/23 1323) Resp:  [16-20] 16 (09/23 1323) BP: (89-118)/(70-76) 100/70 (09/23 1323) SpO2:  [91 %-100 %] 91 % (09/23 1323) Neurologic: Grossly non-focal  Lab Results Recent Labs    06/27/20 1015 06/28/20 0409  WBC 8.7 11.6*  HGB 7.7* 7.8*  HCT 23.5* 23.5*  NA 130* 133*  K 4.4 5.4*  CL 93* 95*  CO2 24 25  BUN 46* 38*  CREATININE 8.17* 7.85*   Liver Panel Recent Labs    06/26/20 1012 06/27/20 1015  PROT 7.1  --   ALBUMIN 2.1* 2.1*  AST 32  --   ALT 29  --   ALKPHOS 70  --   BILITOT 0.6  --    Sedimentation Rate No results for input(s): ESRSEDRATE in the last 72 hours. C-Reactive Protein No results for input(s): CRP in the last 72 hours.  Microbiology:  Studies/Results: DG Chest Port 1 View  Result Date:  06/27/2020 CLINICAL DATA:  Status post right-sided pigtail catheter drainage in the pleural space EXAM: PORTABLE CHEST 1 VIEW COMPARISON:  06/25/2020 FINDINGS: Cardiac shadow is enlarged but stable. Dialysis catheter is again seen. New pigtail catheter is noted on the right with decrease in the degree of pleural effusion valve repair with the prior exam. Some loculated components remain primarily laterally and in the minor fissure. Patchy airspace opacities are noted and stable. IMPRESSION: No pneumothorax following drainage catheter placement. Reduction in right-sided pleural effusion is seen. Persistent airspace opacities are seen. Electronically Signed   By: Inez Catalina M.D.   On: 06/27/2020 19:14   CT IMAGE GUIDED DRAINAGE BY PERCUTANEOUS CATHETER  Result Date: 06/28/2020 INDICATION: 29 year old male with history of parapneumonic effusion secondary to pneumonia. Pigtail thoracostomy tube requested by Thoracic Surgery. EXAM: CT IMAGE GUIDED DRAINAGE BY PERCUTANEOUS CATHETER COMPARISON:  Chest CT from 06/25/2020 MEDICATIONS: The patient is currently admitted to the hospital and receiving intravenous antibiotics. The antibiotics were administered within an appropriate time frame prior to the initiation of the procedure. ANESTHESIA/SEDATION: Moderate (conscious) sedation was employed during this procedure. A total of Versed 2 mg and Fentanyl 100 mcg was administered intravenously. Moderate Sedation Time: 14 minutes. The patient's level of consciousness and vital signs were  monitored continuously by radiology nursing throughout the procedure under my direct supervision. CONTRAST:  None COMPLICATIONS: None immediate. PROCEDURE: Informed written consent was obtained from the patient after a discussion of the risks, benefits and alternatives to treatment. The patient was placed supine, partially left lateral decubitus on the CT gantry and a pre procedural CT was performed re-demonstrating the known fluid collection  within the right pleural space. The procedure was planned. A timeout was performed prior to the initiation of the procedure. The right posterolateral and inferior thorax was prepped and draped in the usual sterile fashion. The overlying soft tissues were anesthetized with 1% lidocaine with epinephrine. Appropriate trajectory was planned with the use of a 22 gauge spinal needle. An 18 gauge trocar needle was advanced into the pleural space and a short Amplatz super stiff wire was coiled within the collection. Appropriate positioning was confirmed with a limited CT scan. The tract was serially dilated allowing placement of a 14 French all-purpose drainage catheter. Appropriate positioning was confirmed with a limited postprocedural CT scan. A total of approximately 100 ml of translucent, slightly blood tinged straw-colored fluid was aspirated. Samples were sent to the lab for fluid analysis. The tube was connected to a pleurovac and sutured in place. A dressing was placed. The patient tolerated the procedure well without immediate post procedural complication. IMPRESSION: Successful CT guided placement of a 6 French all purpose drain catheter into the posterolateral and inferior right pleural space with aspiration of approximately 100 mL translucent, slightly blood tinged straw-colored fluid. Samples were sent to the laboratory as requested by the ordering clinical team. Electronically Signed   By: Ruthann Cancer MD   On: 06/28/2020 07:47     Assessment/Plan:   Strep pneumo bacteremia Pneumonia and empyema-has chest drain On cefepime and vanco- if culture from pleural fluid remains neg tomorrow will switch over to ceftriaxone  Accelerated HTN on 4 meds  CKD- stage V- started dialysis this admission  AIDS- on Symtuza and  on bactrim for PCP prophylaxis  Discussed with hospitalist

## 2020-06-28 NOTE — Progress Notes (Addendum)
Transportation issue is resolved. After informing patient of exact location of clinic, patient stated that he "can just walk to clinic". Financial OON authorization also resolved, patient can start at [facility] MWF 6:30am when medically stable for discharge. Please inform me when patient is medically stable so that coordination can be completed.  Elvera Bicker Dialysis Coordinator [phone]

## 2020-06-28 NOTE — Progress Notes (Signed)
PROGRESS NOTE    Daniel Combs  GYI:[id number] DOB: Aug 18, 1991 DOA: 06/16/2020 PCP: Pcp, No    Brief Narrative:  29 year old male with history of untreated HIV, hypertension, untreated chronic kidney disease progressing to ESRD who was initially admitted on 06/16/2020 with cough and back pain of 1 week duration.  Has not been adherent to antihypertensive or HIV treatment.  Blood cultures found to be positive for Streptococcus pneumonia.  Associated large right-sided empyema.  Infectious disease, nephrology, cardiothoracic surgery, interventional radiology on consult.  Initially met for sepsis secondary to community-acquired pneumonia.  Completed 10 days of IV Rocephin.  This is when the repeat chest CT showed loculated right pleural effusion.  Antibiotics expanded.  Nephrology consulted for ESRD.  Vascular surgery placed tunneled hemodialysis catheter 06/18/2020.  New start hemodialysis this admission.  Patient status post placement of an image guided right-sided pigtail catheter 06/27/2020.  Cardiothoracic surgery following for intrapleural TPA instillation.   Assessment & Plan:   Principal Problem:   Sepsis ([place]) Active Problems:   CAP (community acquired pneumonia)   Hypertension   ESRD (end stage renal disease) ([place])   Hyponatremia   Hyperkalemia   Elevated troponin   Anemia in ESRD (end-stage renal disease) ([place])   Bacteremia due to Streptococcus pneumoniae   Pleural effusion on right  Sepsis secondary to Streptococcus pneumonia bacteremia Complex loculated right pleural effusion secondary to above Plan: Cardiothoracic surgery following Recommend IR guided placement of right-sided pigtail catheter Status post right-sided pigtail catheter 06/27/2020 CTS following, plan for TPA instillation 06/28/2020 On broad-spectrum IV antibiotics, ID following  AIDS Patient has been nonadherent to his HIV treatment started on Symtuza during this admission On Bactrim for P JP  prophylaxis Infectious disease following  Accelerated hypertension Reasonable control on 4 medications  End-stage renal disease on hemodialysis New HD start this admission Nephrology consult following for HD needs Outpatient HD chair found [place] Monday Wednesday Friday 6:30 AM   DVT prophylaxis: Subcu heparin Code Status: Full Family Communication: None today Disposition Plan: Status is: Inpatient  Remains inpatient appropriate because:Inpatient level of care appropriate due to severity of illness   Dispo: The patient is from: Home              Anticipated d/c is to: Home              Anticipated d/c date is: > 3 days              Patient currently is not medically stable to d/c.   Status post right-sided pigtail catheter.  CTS following for intrapleural TPA administration.  Anticipate patient to be in house at least through the weekend.   Consultants:   Nephrology  Infectious disease Cardiothoracic surgery Interventional radiology  Procedures:   Right-sided chest tube, 06/27/2020  Antimicrobials:   Vancomycin  Cefepime   Subjective: Seen and examined.  Continues to endorse some chest tightness though improving over interval  Objective: Vitals:   06/27/20 1640 06/27/20 1657 06/27/20 2124 06/28/20 0508  BP:  106/76 107/73 114/71  Pulse: (!) 102 (!) 108 97 (!) 105  Resp: '20 18 18   ' Temp:  99.8 F (37.7 C) 99.1 F (37.3 C) 98.2 F (36.8 C)  TempSrc:   Oral Oral  SpO2: 100% 94% 93% 93%  Weight:      Height:        Intake/Output Summary (Last 24 hours) at 06/28/2020 1244 Last data filed at 06/28/2020 0917 Gross per 24 hour  Intake 293.23 ml  Output 2043 ml  Net -1749.77 ml   Filed Weights   06/16/20 2228  Weight: 60.8 kg    Examination:  General exam: Appears calm and comfortable  Respiratory system: Decreased breath sounds on the right.  Right-sided pigtail catheter.  Normal work of breathing.  Room air Cardiovascular system:  S1 & S2 heard, RRR. No JVD, murmurs, rubs, gallops or clicks. No pedal edema. Gastrointestinal system: Abdomen is nondistended, soft and nontender. No organomegaly or masses felt. Normal bowel sounds heard. Central nervous system: Alert and oriented. No focal neurological deficits. Extremities: Symmetric 5 x 5 power. Skin: No rashes, lesions or ulcers Psychiatry: Judgement and insight appear normal. Mood & affect appropriate.     Data Reviewed: I have personally reviewed following labs and imaging studies  CBC: Recent Labs  Lab 06/25/20 0812 06/26/20 1012 06/27/20 0630 06/27/20 1015 06/28/20 0409  WBC 13.0* 11.1* 11.0* 8.7 11.6*  NEUTROABS 9.9* 8.4* 7.9*  --  8.6*  HGB 7.0* 7.7* 7.6* 7.7* 7.8*  HCT 21.4* 23.4* 23.1* 23.5* 23.5*  MCV 83.6 84.2 84.9 83.6 83.3  PLT 345 372 420* 407* 321*   Basic Metabolic Panel: Recent Labs  Lab 06/22/20 0447 06/23/20 0911 06/25/20 0812 06/26/20 1012 06/27/20 1015 06/28/20 0409  NA  --  130* 127* 131* 130* 133*  K  --  4.1 4.7 4.2 4.4 5.4*  CL  --  92* 90* 94* 93* 95*  CO2  --  27 21* '24 24 25  ' GLUCOSE  --  111* 93 153* 94 95  BUN  --  40* 78* 46* 46* 38*  CREATININE  --  7.66* 12.10* 8.61* 8.17* 7.85*  CALCIUM  --  7.7* 7.7* 7.8* 8.4* 8.3*  PHOS 8.0* 7.0*  --   --  6.2*  --    GFR: Estimated Creatinine Clearance: 11.8 mL/min (A) (by C-G formula based on SCr of 7.85 mg/dL (H)). Liver Function Tests: Recent Labs  Lab 06/23/20 0911 06/26/20 1012 06/27/20 1015  AST  --  32  --   ALT  --  29  --   ALKPHOS  --  70  --   BILITOT  --  0.6  --   PROT  --  7.1  --   ALBUMIN 2.2* 2.1* 2.1*   No results for input(s): LIPASE, AMYLASE in the last 168 hours. No results for input(s): AMMONIA in the last 168 hours. Coagulation Profile: Recent Labs  Lab 06/23/20 0032  INR 1.1   Cardiac Enzymes: No results for input(s): CKTOTAL, CKMB, CKMBINDEX, TROPONINI in the last 168 hours. BNP (last 3 results) No results for input(s): PROBNP in  the last 8760 hours. HbA1C: No results for input(s): HGBA1C in the last 72 hours. CBG: No results for input(s): GLUCAP in the last 168 hours. Lipid Profile: No results for input(s): CHOL, HDL, LDLCALC, TRIG, CHOLHDL, LDLDIRECT in the last 72 hours. Thyroid Function Tests: No results for input(s): TSH, T4TOTAL, FREET4, T3FREE, THYROIDAB in the last 72 hours. Anemia Panel: No results for input(s): VITAMINB12, FOLATE, FERRITIN, TIBC, IRON, RETICCTPCT in the last 72 hours. Sepsis Labs: Recent Labs  Lab 06/25/20 1911 06/25/20 1927 06/26/20 0518 06/27/20 0630  PROCALCITON 11.77  --  9.18 <0.10  LATICACIDVEN  --  0.8  --   --     Recent Results (from the past 240 hour(s))  CULTURE, BLOOD (ROUTINE X 2) w Reflex to ID Panel     Status: None (Preliminary result)   Collection Time: 06/25/20  7:13 PM  Specimen: BLOOD  Result Value Ref Range Status   Specimen Description BLOOD LEFT ANTECUBITAL  Final   Special Requests   Final    BOTTLES DRAWN AEROBIC AND ANAEROBIC Blood Culture adequate volume   Culture   Final    NO GROWTH 3 DAYS Performed at [facility], [address] [place], [place] [address]    Report Status PENDING  Incomplete  CULTURE, BLOOD (ROUTINE X 2) w Reflex to ID Panel     Status: None (Preliminary result)   Collection Time: 06/25/20  7:27 PM   Specimen: BLOOD  Result Value Ref Range Status   Specimen Description BLOOD BLOOD LEFT HAND  Final   Special Requests   Final    BOTTLES DRAWN AEROBIC AND ANAEROBIC Blood Culture adequate volume   Culture   Final    NO GROWTH 3 DAYS Performed at [facility], [address] [place], [place] [address]    Report Status PENDING  Incomplete  MRSA PCR Screening     Status: None   Collection Time: 06/26/20  8:50 PM   Specimen: Nasal Mucosa; Nasopharyngeal  Result Value Ref Range Status   MRSA by PCR NEGATIVE NEGATIVE Final    Comment:        The GeneXpert MRSA Assay (FDA approved for NASAL  specimens only), is one component of a comprehensive MRSA colonization surveillance program. It is not intended to diagnose MRSA infection nor to guide or monitor treatment for MRSA infections. Performed at [facility], [address] [place], [place] [address]   Aerobic/Anaerobic Culture (surgical/deep wound)     Status: None (Preliminary result)   Collection Time: 06/27/20  4:47 PM   Specimen: Pleural Fluid  Result Value Ref Range Status   Specimen Description   Final    PLEURAL Performed at [facility], [address], [place], [place] [address]    Special Requests   Final    PLEURAL FLUID CHEST Performed at [facility], [address] [place], [place] [address]    Gram Stain   Final    RARE WBC PRESENT,BOTH PMN AND MONONUCLEAR NO ORGANISMS SEEN    Culture   Final    NO GROWTH < 12 HOURS Performed at [facility], [place] [address] [place], [place] [address]    Report Status PENDING  Incomplete         Radiology Studies: DG Chest Port 1 View  Result Date: 06/27/2020 CLINICAL DATA:  Status post right-sided pigtail catheter drainage in the pleural space EXAM: PORTABLE CHEST 1 VIEW COMPARISON:  06/25/2020 FINDINGS: Cardiac shadow is enlarged but stable. Dialysis catheter is again seen. New pigtail catheter is noted on the right with decrease in the degree of pleural effusion valve repair with the prior exam. Some loculated components remain primarily laterally and in the minor fissure. Patchy airspace opacities are noted and stable. IMPRESSION: No pneumothorax following drainage catheter placement. Reduction in right-sided pleural effusion is seen. Persistent airspace opacities are seen. Electronically Signed   By: Inez Catalina M.D.   On: 06/27/2020 19:14   CT IMAGE GUIDED DRAINAGE BY PERCUTANEOUS CATHETER  Result Date: 06/28/2020 INDICATION: 29 year old male with history of parapneumonic effusion secondary to pneumonia. Pigtail  thoracostomy tube requested by Thoracic Surgery. EXAM: CT IMAGE GUIDED DRAINAGE BY PERCUTANEOUS CATHETER COMPARISON:  Chest CT from 06/25/2020 MEDICATIONS: The patient is currently admitted to the hospital and receiving intravenous antibiotics. The antibiotics were administered within an appropriate time frame prior to  the initiation of the procedure. ANESTHESIA/SEDATION: Moderate (conscious) sedation was employed during this procedure. A total of Versed 2 mg and Fentanyl 100 mcg was administered intravenously. Moderate Sedation Time: 14 minutes. The patient's level of consciousness and vital signs were monitored continuously by radiology nursing throughout the procedure under my direct supervision. CONTRAST:  None COMPLICATIONS: None immediate. PROCEDURE: Informed written consent was obtained from the patient after a discussion of the risks, benefits and alternatives to treatment. The patient was placed supine, partially left lateral decubitus on the CT gantry and a pre procedural CT was performed re-demonstrating the known fluid collection within the right pleural space. The procedure was planned. A timeout was performed prior to the initiation of the procedure. The right posterolateral and inferior thorax was prepped and draped in the usual sterile fashion. The overlying soft tissues were anesthetized with 1% lidocaine with epinephrine. Appropriate trajectory was planned with the use of a 22 gauge spinal needle. An 18 gauge trocar needle was advanced into the pleural space and a short Amplatz super stiff wire was coiled within the collection. Appropriate positioning was confirmed with a limited CT scan. The tract was serially dilated allowing placement of a 14 French all-purpose drainage catheter. Appropriate positioning was confirmed with a limited postprocedural CT scan. A total of approximately 100 ml of translucent, slightly blood tinged straw-colored fluid was aspirated. Samples were sent to the lab for  fluid analysis. The tube was connected to a pleurovac and sutured in place. A dressing was placed. The patient tolerated the procedure well without immediate post procedural complication. IMPRESSION: Successful CT guided placement of a 79 French all purpose drain catheter into the posterolateral and inferior right pleural space with aspiration of approximately 100 mL translucent, slightly blood tinged straw-colored fluid. Samples were sent to the laboratory as requested by the ordering clinical team. Electronically Signed   By: Ruthann Cancer MD   On: 06/28/2020 07:47        Scheduled Meds: . amLODipine  10 mg Oral Daily  . carvedilol  25 mg Oral BID WC  . Chlorhexidine Gluconate Cloth  6 each Topical Q0600  . Darunavir-Cobicisctat-Emtricitabine-Tenofovir Alafenamide  1 tablet Oral Q supper  . [START ON 06/29/2020] epoetin (EPOGEN/PROCRIT) injection  10,000 Units Intravenous Q M,W,F-HD  . feeding supplement (NEPRO CARB STEADY)  237 mL Oral BID BM  . hydrALAZINE  50 mg Oral Q8H  . losartan  100 mg Oral Daily  . multivitamin  1 tablet Oral QHS  . sevelamer carbonate  1,600 mg Oral TID WC  . sulfamethoxazole-trimethoprim  1 tablet Oral Daily   Continuous Infusions: . sodium chloride Stopped (06/27/20 2316)  . ceFEPime (MAXIPIME) IV Stopped (06/27/20 2211)  . vancomycin Stopped (06/27/20 1827)     LOS: 12 days    Time spent: 25 minutes    Sidney Ace, MD [facility] Pager [phone]  If 7PM-7AM, please contact night-coverage 06/28/2020, 12:44 PM

## 2020-06-28 NOTE — Progress Notes (Signed)
Daniel Combs Follow Up Note  Patient ID: Daniel Combs, male   DOB: 06-Dec-1990, 29 y.o.   MRN: [id number]  HISTORY: No problems overnight.  He tolerated his percutaneous drain well.    Vitals:   06/27/20 2124 06/28/20 0508  BP: 107/73 114/71  Pulse: 97 (!) 105  Resp: 18   Temp: 99.1 F (37.3 C) 98.2 F (36.8 C)  SpO2: 93% 93%     EXAM:  Resp: Lungs are clear on the left and slightly diminished on the right.  No respiratory distress, normal effort. Heart:  Regular without murmurs Abd:  Abdomen is soft, non distended and non tender. No masses are palpable.  There is no rebound and no guarding.  Neurological: Alert and oriented to person, place, and time. Coordination normal.  Skin: Skin is warm and dry. No rash noted. No diaphoretic. No erythema. No pallor.  Psychiatric: Normal mood and affect. Normal behavior. Judgment and thought content normal.   There is no air leak from the chest tube.  The chest drain has only clear slightly yellowish fluid.   ASSESSMENT: I have independently reviewed the patient's chest x-ray after insertion of the drain.  There has been minimal impact on the chest x-ray findings.  I reviewed with the patient the indications and risks for intrapleural thrombolytics.   PLAN:   We will plan on placing intrapleural TPA today.  We will leave the catheter clamped for 4 hours after instillation of the TPA.    Nestor Lewandowsky, MDPatient ID: Daniel Combs, male   DOB: Oct 29, 1990, 29 y.o.   MRN: [id number]

## 2020-06-29 ENCOUNTER — Inpatient Hospital Stay: Payer: Medicaid Other

## 2020-06-29 LAB — CBC WITH DIFFERENTIAL/PLATELET
Abs Immature Granulocytes: 0.06 10*3/uL (ref 0.00–0.07)
Basophils Absolute: 0.1 10*3/uL (ref 0.0–0.1)
Basophils Relative: 1 %
Eosinophils Absolute: 0.2 10*3/uL (ref 0.0–0.5)
Eosinophils Relative: 2 %
HCT: 23.6 % — ABNORMAL LOW (ref 39.0–52.0)
Hemoglobin: 7.7 g/dL — ABNORMAL LOW (ref 13.0–17.0)
Immature Granulocytes: 1 %
Lymphocytes Relative: 8 %
Lymphs Abs: 0.8 10*3/uL (ref 0.7–4.0)
MCH: 28.1 pg (ref 26.0–34.0)
MCHC: 32.6 g/dL (ref 30.0–36.0)
MCV: 86.1 fL (ref 80.0–100.0)
Monocytes Absolute: 1.6 10*3/uL — ABNORMAL HIGH (ref 0.1–1.0)
Monocytes Relative: 16 %
Neutro Abs: 7.2 10*3/uL (ref 1.7–7.7)
Neutrophils Relative %: 72 %
Platelets: 440 10*3/uL — ABNORMAL HIGH (ref 150–400)
RBC: 2.74 MIL/uL — ABNORMAL LOW (ref 4.22–5.81)
RDW: 14.6 % (ref 11.5–15.5)
WBC: 10 10*3/uL (ref 4.0–10.5)
nRBC: 0 % (ref 0.0–0.2)

## 2020-06-29 LAB — CBC
HCT: 23.9 % — ABNORMAL LOW (ref 39.0–52.0)
Hemoglobin: 8 g/dL — ABNORMAL LOW (ref 13.0–17.0)
MCH: 27.8 pg (ref 26.0–34.0)
MCHC: 33.5 g/dL (ref 30.0–36.0)
MCV: 83 fL (ref 80.0–100.0)
Platelets: 457 10*3/uL — ABNORMAL HIGH (ref 150–400)
RBC: 2.88 MIL/uL — ABNORMAL LOW (ref 4.22–5.81)
RDW: 14.6 % (ref 11.5–15.5)
WBC: 10.2 10*3/uL (ref 4.0–10.5)
nRBC: 0 % (ref 0.0–0.2)

## 2020-06-29 LAB — BASIC METABOLIC PANEL
Anion gap: 15 (ref 5–15)
BUN: 52 mg/dL — ABNORMAL HIGH (ref 6–20)
CO2: 23 mmol/L (ref 22–32)
Calcium: 8.3 mg/dL — ABNORMAL LOW (ref 8.9–10.3)
Chloride: 92 mmol/L — ABNORMAL LOW (ref 98–111)
Creatinine, Ser: 10.64 mg/dL — ABNORMAL HIGH (ref 0.61–1.24)
GFR calc Af Amer: 7 mL/min — ABNORMAL LOW (ref >60)
GFR calc non Af Amer: 6 mL/min — ABNORMAL LOW (ref >60)
Glucose, Bld: 91 mg/dL (ref 70–99)
Potassium: 5.3 mmol/L — ABNORMAL HIGH (ref 3.5–5.1)
Sodium: 130 mmol/L — ABNORMAL LOW (ref 135–145)

## 2020-06-29 MED ORDER — SODIUM CHLORIDE 0.9 % IV SOLN
2.0000 g | INTRAVENOUS | Status: DC
  Administered 2020-06-29 – 2020-07-04 (×6): 2 g via INTRAVENOUS
  Filled 2020-06-29: qty 0.67
  Filled 2020-06-29 (×3): qty 2
  Filled 2020-06-29: qty 20
  Filled 2020-06-29 (×2): qty 2

## 2020-06-29 NOTE — Progress Notes (Signed)
[facility] SURGICAL PROGRESS NOTE (cpt [phone])  Hospital Day(s): 13.   Post op day(s): 11 Days Post-Op.   Interval History: Patient seen and examined, no acute events or new complaints overnight. Patient reports he is doing well, no complaints of chest pain nor SOB. tPA instilled yesterday, had 675 ccs overnight from chest tube. Output appears more serous. CXR this morning still pending. CX from 09/22 without growth. ID following. Currently on Cefepime and Vancomycin.   Review of Systems:  Constitutional: denies fever, chills  HEENT: denies cough or congestion  Respiratory: denies any shortness of breath  Cardiovascular: denies chest pain or palpitations  Gastrointestinal: denies abdominal pain, N/V, or diarrhea/and bowel function as per interval history Genitourinary: denies burning with urination or urinary frequency  Vital signs in last 24 hours: [min-max] current  Temp:  [98.7 F (37.1 C)-99 F (37.2 C)] 99 F (37.2 C) (09/24 0521) Pulse Rate:  [91-99] 99 (09/24 0521) Resp:  [16-20] 18 (09/24 0521) BP: (100-110)/(70-77) 110/71 (09/24 0521) SpO2:  [86 %-91 %] 86 % (09/24 0521)     Height: 5\' 6"  (167.6 cm) Weight: 60.8 kg BMI (Calculated): 21.64   Intake/Output last 2 shifts:  09/23 0701 - 09/24 0700 In: -  Out: 676 [Chest Tube:676]   Physical Exam:  Constitutional: alert, cooperative and no distress  HENT: normocephalic without obvious abnormality  Eyes: PERRL, EOM's grossly intact and symmetric  Respiratory: breathing non-labored at rest  Cardiovascular: regular rate and sinus rhythm  Chest: Chest tube in right chest, site CDI, output serous, no air leak Musculoskeletal: no edema or wounds, motor and sensation grossly intact, NT    Labs:  CBC Latest Ref Rng & Units 06/29/2020 06/29/2020 06/28/2020  WBC 4.0 - 10.5 K/uL 10.2 10.0 11.6(H)  Hemoglobin 13.0 - 17.0 g/dL 8.0(L) 7.7(L) 7.8(L)  Hematocrit 39 - 52 % 23.9(L) 23.6(L) 23.5(L)  Platelets 150 - 400  K/uL 457(H) 440(H) 448(H)   CMP Latest Ref Rng & Units 06/29/2020 06/28/2020 06/27/2020  Glucose 70 - 99 mg/dL 91 95 94  BUN 6 - 20 mg/dL 52(H) 38(H) 46(H)  Creatinine 0.61 - 1.24 mg/dL 10.64(H) 7.85(H) 8.17(H)  Sodium 135 - 145 mmol/L 130(L) 133(L) 130(L)  Potassium 3.5 - 5.1 mmol/L 5.3(H) 5.4(H) 4.4  Chloride 98 - 111 mmol/L 92(L) 95(L) 93(L)  CO2 22 - 32 mmol/L 23 25 24   Calcium 8.9 - 10.3 mg/dL 8.3(L) 8.3(L) 8.4(L)  Total Protein 6.5 - 8.1 g/dL - - -  Total Bilirubin 0.3 - 1.2 mg/dL - - -  Alkaline Phos 38 - 126 U/L - - -  AST 15 - 41 U/L - - -  ALT 0 - 44 U/L - - -     Imaging studies: No new pertinent imaging studies, CXR pending   Assessment/Plan: (ICD-10's: J90.0) 29 y.o. male with right pleural effusion s/p tPA instillation on 09/23   - Follow up CXR today, pending results will determine need for additional tPA instillations. He clinically appears to have responded well  - Continue chest tube to suction -20 cm  - Pain control prn  - Pulmonary toilet; IS use  - Mobilization  - Further management per primary service    All of the above findings and recommendations were discussed with the patient, and the medical team, and all of patient's questions were answered to his expressed satisfaction.  -- Edison Simon, PA-C [facility] 06/29/2020, 9:53 AM [phone] M-F: 7am - 4pm .

## 2020-06-29 NOTE — Progress Notes (Signed)
Date of Admission:  06/16/2020    ID: Daniel Combs is a 29 y.o. male Principal Problem:   Sepsis ([place]) Active Problems:   CAP (community acquired pneumonia)   Hypertension   ESRD (end stage renal disease) ([name])   Hyponatremia   Hyperkalemia   Elevated troponin   Anemia in ESRD (end-stage renal disease) ([name])   Bacteremia due to Streptococcus pneumoniae   Pleural effusion on right    Subjective: Feeling better Cough present but feels ches is looser and not tight like before White sputum  Medications:  . amLODipine  10 mg Oral Daily  . carvedilol  25 mg Oral BID WC  . Chlorhexidine Gluconate Cloth  6 each Topical Q0600  . Darunavir-Cobicisctat-Emtricitabine-Tenofovir Alafenamide  1 tablet Oral Q supper  . epoetin (EPOGEN/PROCRIT) injection  10,000 Units Intravenous Q M,W,F-HD  . feeding supplement (NEPRO CARB STEADY)  237 mL Oral BID BM  . hydrALAZINE  50 mg Oral Q8H  . losartan  100 mg Oral Daily  . multivitamin  1 tablet Oral QHS  . sevelamer carbonate  1,600 mg Oral TID WC  . sulfamethoxazole-trimethoprim  1 tablet Oral Daily    Objective: Vital signs in last 24 hours: Temp:  [98.7 F (37.1 C)-99 F (37.2 C)] 99 F (37.2 C) (09/24 0521) Pulse Rate:  [91-99] 99 (09/24 0521) Resp:  [16-20] 18 (09/24 0521) BP: (100-110)/(70-77) 110/71 (09/24 0521) SpO2:  [86 %-91 %] 86 % (09/24 0521)  PHYSICAL EXAM:  General: Alert, cooperative, no distress, appears stated age. Getting dialysis Head: Normocephalic, without obvious abnormality, atraumatic. Eyes: Conjunctivae clear, anicteric sclerae. Pupils are equal ENT Nares normal. No drainage or sinus tenderness. Lips, mucosa, and tongue normal. No Thrush Lungs: b/l air entry Rt chest drain Heart: Regular rate and rhythm, no murmur, rub or gallop. Abdomen: Soft, non-tender,not distended. Bowel sounds normal. No masses Extremities: atraumatic, no cyanosis. No edema. No clubbing Skin: No rashes or lesions. Or  bruising Lymph: Cervical, supraclavicular normal. Neurologic: Grossly non-focal  Lab Results Recent Labs    06/28/20 0409 06/28/20 0409 06/29/20 0434 06/29/20 0857  WBC 11.6*   < > 10.0 10.2  HGB 7.8*   < > 7.7* 8.0*  HCT 23.5*   < > 23.6* 23.9*  NA 133*  --  130*  --   K 5.4*  --  5.3*  --   CL 95*  --  92*  --   CO2 25  --  23  --   BUN 38*  --  52*  --   CREATININE 7.85*  --  10.64*  --    < > = values in this interval not displayed.   Liver Panel Recent Labs    06/27/20 1015  ALBUMIN 2.1*   Sedimentation Rate No results for input(s): ESRSEDRATE in the last 72 hours. C-Reactive Protein No results for input(s): CRP in the last 72 hours.  Microbiology: 9/20 BC- NG 9/23 Fluid culture NG  Studies/Results: DG Chest Port 1 View  Result Date: 06/27/2020 CLINICAL DATA:  Status post right-sided pigtail catheter drainage in the pleural space EXAM: PORTABLE CHEST 1 VIEW COMPARISON:  06/25/2020 FINDINGS: Cardiac shadow is enlarged but stable. Dialysis catheter is again seen. New pigtail catheter is noted on the right with decrease in the degree of pleural effusion valve repair with the prior exam. Some loculated components remain primarily laterally and in the minor fissure. Patchy airspace opacities are noted and stable. IMPRESSION: No pneumothorax following drainage catheter placement. Reduction in right-sided  pleural effusion is seen. Persistent airspace opacities are seen. Electronically Signed   By: Inez Catalina M.D.   On: 06/27/2020 19:14   CT IMAGE GUIDED DRAINAGE BY PERCUTANEOUS CATHETER  Result Date: 06/28/2020 INDICATION: 29 year old male with history of parapneumonic effusion secondary to pneumonia. Pigtail thoracostomy tube requested by Thoracic Surgery. EXAM: CT IMAGE GUIDED DRAINAGE BY PERCUTANEOUS CATHETER COMPARISON:  Chest CT from 06/25/2020 MEDICATIONS: The patient is currently admitted to the hospital and receiving intravenous antibiotics. The antibiotics were  administered within an appropriate time frame prior to the initiation of the procedure. ANESTHESIA/SEDATION: Moderate (conscious) sedation was employed during this procedure. A total of Versed 2 mg and Fentanyl 100 mcg was administered intravenously. Moderate Sedation Time: 14 minutes. The patient's level of consciousness and vital signs were monitored continuously by radiology nursing throughout the procedure under my direct supervision. CONTRAST:  None COMPLICATIONS: None immediate. PROCEDURE: Informed written consent was obtained from the patient after a discussion of the risks, benefits and alternatives to treatment. The patient was placed supine, partially left lateral decubitus on the CT gantry and a pre procedural CT was performed re-demonstrating the known fluid collection within the right pleural space. The procedure was planned. A timeout was performed prior to the initiation of the procedure. The right posterolateral and inferior thorax was prepped and draped in the usual sterile fashion. The overlying soft tissues were anesthetized with 1% lidocaine with epinephrine. Appropriate trajectory was planned with the use of a 22 gauge spinal needle. An 18 gauge trocar needle was advanced into the pleural space and a short Amplatz super stiff wire was coiled within the collection. Appropriate positioning was confirmed with a limited CT scan. The tract was serially dilated allowing placement of a 14 French all-purpose drainage catheter. Appropriate positioning was confirmed with a limited postprocedural CT scan. A total of approximately 100 ml of translucent, slightly blood tinged straw-colored fluid was aspirated. Samples were sent to the lab for fluid analysis. The tube was connected to a pleurovac and sutured in place. A dressing was placed. The patient tolerated the procedure well without immediate post procedural complication. IMPRESSION: Successful CT guided placement of a 72 French all purpose drain  catheter into the posterolateral and inferior right pleural space with aspiration of approximately 100 mL translucent, slightly blood tinged straw-colored fluid. Samples were sent to the laboratory as requested by the ordering clinical team. Electronically Signed   By: Ruthann Cancer MD   On: 06/28/2020 07:47     Assessment/Plan:  Strep pneumo bacteremia Pneumonia and rt empyema-has chest drain On cefepime and vanco- as culture from pleural fluid remains neg  switch to ceftriaxone. Day 13 of antibiotic. Because of empyema will need longer treatment-  Accelerated HTN on 4 meds  CKD- stage V- started dialysis this admission  AIDS- on Symtuza and  on bactrim for PCP prophylaxis  ID will follow him peripherally this weekend- call if needed

## 2020-06-29 NOTE — Progress Notes (Signed)
PROGRESS NOTE    Daniel Combs  ZCH:[id number] DOB: May 13, 1991 DOA: 06/16/2020 PCP: Pcp, No    Brief Narrative:  29 year old male with history of untreated HIV, hypertension, untreated chronic kidney disease progressing to ESRD who was initially admitted on 06/16/2020 with cough and back pain of 1 week duration.  Has not been adherent to antihypertensive or HIV treatment.  Blood cultures found to be positive for Streptococcus pneumonia.  Associated large right-sided empyema.  Infectious disease, nephrology, cardiothoracic surgery, interventional radiology on consult.  Initially met for sepsis secondary to community-acquired pneumonia.  Completed 10 days of IV Rocephin.  This is when the repeat chest CT showed loculated right pleural effusion.  Antibiotics expanded.  Nephrology consulted for ESRD.  Vascular surgery placed tunneled hemodialysis catheter 06/18/2020.  New start hemodialysis this admission.  Patient status post placement of an image guided right-sided pigtail catheter 06/27/2020.  Cardiothoracic surgery following for intrapleural TPA instillation.  Patient reported some back pain at the chest tube insertion site after first TPA instillation   Assessment & Plan:   Principal Problem:   Sepsis ([place]) Active Problems:   CAP (community acquired pneumonia)   Hypertension   ESRD (end stage renal disease) ([place])   Hyponatremia   Hyperkalemia   Elevated troponin   Anemia in ESRD (end-stage renal disease) ([place])   Bacteremia due to Streptococcus pneumoniae   Pleural effusion on right  Sepsis secondary to Streptococcus pneumonia bacteremia Complex loculated right pleural effusion secondary to above Plan: Cardiothoracic surgery following Recommend IR guided placement of right-sided pigtail catheter Status post right-sided pigtail catheter 06/27/2020 CTS following, plan for TPA instillation 06/28/2020 On broad-spectrum IV antibiotics, ID following  AIDS Patient has been nonadherent  to his HIV treatment started on Symtuza during this admission On Bactrim for P JP prophylaxis Infectious disease following  Accelerated hypertension Reasonable control on 4 medications  End-stage renal disease on hemodialysis New HD start this admission Nephrology consult following for HD needs Outpatient HD chair found [place] Monday Wednesday Friday 6:30 AM   DVT prophylaxis: Subcu heparin Code Status: Full Family Communication: None today Disposition Plan: Status is: Inpatient  Remains inpatient appropriate because:Inpatient level of care appropriate due to severity of illness   Dispo: The patient is from: Home              Anticipated d/c is to: Home              Anticipated d/c date is: > 3 days              Patient currently is not medically stable to d/c.   Patient has right side chest tube in place.  Cardiothoracic following for TPA instillation and chest tube management.   Consultants:   Nephrology  Infectious disease Cardiothoracic surgery Interventional radiology  Procedures:   Right-sided chest tube, 06/27/2020  Antimicrobials:   Vancomycin  Cefepime   Subjective: Seen and examined.  Endorses back pain after TPA instillation yesterday  Objective: Vitals:   06/29/20 0521 06/29/20 0945 06/29/20 1145 06/29/20 1200  BP: 110/71  100/70   Pulse: 99     Resp: _0 Temp: 99 F (37.2 C) 98.8 F (37.1 C)    TempSrc: Oral     SpO2: (!) 86%     Weight:      Height:        Intake/Output Summary (Last 24 hours) at 06/29/2020 1251 Last data filed at 06/29/2020 0940 Gross per 24 hour  Intake --  Output 670 ml  Net -670 ml   Filed Weights   06/16/20 2228  Weight: 60.8 kg    Examination:  General exam: Appears calm and comfortable  Respiratory system: Decreased breath sounds on the right.  Right-sided pigtail catheter.  Normal work of breathing.  Room air Cardiovascular system: S1 & S2 heard, RRR. No JVD, murmurs, rubs,  gallops or clicks. No pedal edema. Gastrointestinal system: Abdomen is nondistended, soft and nontender. No organomegaly or masses felt. Normal bowel sounds heard. Central nervous system: Alert and oriented. No focal neurological deficits. Extremities: Symmetric 5 x 5 power. Skin: No rashes, lesions or ulcers Psychiatry: Judgement and insight appear normal. Mood & affect appropriate.     Data Reviewed: I have personally reviewed following labs and imaging studies  CBC: Recent Labs  Lab 06/25/20 0812 06/25/20 0812 06/26/20 1012 06/26/20 1012 06/27/20 0630 06/27/20 1015 06/28/20 0409 06/29/20 0434 06/29/20 0857  WBC 13.0*   < > 11.1*   < > 11.0* 8.7 11.6* 10.0 10.2  NEUTROABS 9.9*  --  8.4*  --  7.9*  --  8.6* 7.2  --   HGB 7.0*   < > 7.7*   < > 7.6* 7.7* 7.8* 7.7* 8.0*  HCT 21.4*   < > 23.4*   < > 23.1* 23.5* 23.5* 23.6* 23.9*  MCV 83.6   < > 84.2   < > 84.9 83.6 83.3 86.1 83.0  PLT 345   < > 372   < > 420* 407* 448* 440* 457*   < > = values in this interval not displayed.   Basic Metabolic Panel: Recent Labs  Lab 06/23/20 0911 06/23/20 0911 06/25/20 0812 06/26/20 1012 06/27/20 1015 06/28/20 0409 06/29/20 0434  NA 130*   < > 127* 131* 130* 133* 130*  K 4.1   < > 4.7 4.2 4.4 5.4* 5.3*  CL 92*   < > 90* 94* 93* 95* 92*  CO2 27   < > 21* _0 GLUCOSE 111*   < > 93 153* 94 95 91  BUN 40*   < > 78* 46* 46* 38* 52*  CREATININE 7.66*   < > 12.10* 8.61* 8.17* 7.85* 10.64*  CALCIUM 7.7*   < > 7.7* 7.8* 8.4* 8.3* 8.3*  PHOS 7.0*  --   --   --  6.2*  --   --    < > = values in this interval not displayed.   GFR: Estimated Creatinine Clearance: 8.7 mL/min (A) (by C-G formula based on SCr of 10.64 mg/dL (H)). Liver Function Tests: Recent Labs  Lab 06/23/20 0911 06/26/20 1012 06/27/20 1015  AST  --  32  --   ALT  --  29  --   ALKPHOS  --  70  --   BILITOT  --  0.6  --   PROT  --  7.1  --   ALBUMIN 2.2* 2.1* 2.1*   No results for input(s): LIPASE, AMYLASE in  the last 168 hours. No results for input(s): AMMONIA in the last 168 hours. Coagulation Profile: Recent Labs  Lab 06/23/20 0032  INR 1.1   Cardiac Enzymes: No results for input(s): CKTOTAL, CKMB, CKMBINDEX, TROPONINI in the last 168 hours. BNP (last 3 results) No results for input(s): PROBNP in the last 8760 hours. HbA1C: No results for input(s): HGBA1C in the last 72 hours. CBG: No results for input(s): GLUCAP in the last 168 hours. Lipid Profile: No results for input(s): CHOL, HDL, LDLCALC,  TRIG, CHOLHDL, LDLDIRECT in the last 72 hours. Thyroid Function Tests: No results for input(s): TSH, T4TOTAL, FREET4, T3FREE, THYROIDAB in the last 72 hours. Anemia Panel: No results for input(s): VITAMINB12, FOLATE, FERRITIN, TIBC, IRON, RETICCTPCT in the last 72 hours. Sepsis Labs: Recent Labs  Lab 06/25/20 1911 06/25/20 1927 06/26/20 0518 06/27/20 0630  PROCALCITON 11.77  --  9.18 <0.10  LATICACIDVEN  --  0.8  --   --     Recent Results (from the past 240 hour(s))  CULTURE, BLOOD (ROUTINE X 2) w Reflex to ID Panel     Status: None (Preliminary result)   Collection Time: 06/25/20  7:13 PM   Specimen: BLOOD  Result Value Ref Range Status   Specimen Description BLOOD LEFT ANTECUBITAL  Final   Special Requests   Final    BOTTLES DRAWN AEROBIC AND ANAEROBIC Blood Culture adequate volume   Culture   Final    NO GROWTH 4 DAYS Performed at [facility], [address], [place], [place] [address]    Report Status PENDING  Incomplete  CULTURE, BLOOD (ROUTINE X 2) w Reflex to ID Panel     Status: None (Preliminary result)   Collection Time: 06/25/20  7:27 PM   Specimen: BLOOD  Result Value Ref Range Status   Specimen Description BLOOD BLOOD LEFT HAND  Final   Special Requests   Final    BOTTLES DRAWN AEROBIC AND ANAEROBIC Blood Culture adequate volume   Culture   Final    NO GROWTH 4 DAYS Performed at [facility], [address] [place], [place] [address]     Report Status PENDING  Incomplete  MRSA PCR Screening     Status: None   Collection Time: 06/26/20  8:50 PM   Specimen: Nasal Mucosa; Nasopharyngeal  Result Value Ref Range Status   MRSA by PCR NEGATIVE NEGATIVE Final    Comment:        The GeneXpert MRSA Assay (FDA approved for NASAL specimens only), is one component of a comprehensive MRSA colonization surveillance program. It is not intended to diagnose MRSA infection nor to guide or monitor treatment for MRSA infections. Performed at [facility], [address] [place], [place] [address]   Aerobic/Anaerobic Culture (surgical/deep wound)     Status: None (Preliminary result)   Collection Time: 06/27/20  4:47 PM   Specimen: Pleural Fluid  Result Value Ref Range Status   Specimen Description   Final    PLEURAL Performed at [facility], [address] [place], [place] [address]    Special Requests   Final    PLEURAL FLUID CHEST Performed at [facility], [address] [place], [place] [address]    Gram Stain   Final    RARE WBC PRESENT,BOTH PMN AND MONONUCLEAR NO ORGANISMS SEEN    Culture   Final    NO GROWTH 2 DAYS NO ANAEROBES ISOLATED; CULTURE IN PROGRESS FOR 5 DAYS Performed at [facility], [place] [address], [place], [place] [address]    Report Status PENDING  Incomplete         Radiology Studies: DG Chest Port 1 View  Result Date: 06/27/2020 CLINICAL DATA:  Status post right-sided pigtail catheter drainage in the pleural space EXAM: PORTABLE CHEST 1 VIEW COMPARISON:  06/25/2020 FINDINGS: Cardiac shadow is enlarged but stable. Dialysis catheter is again seen. New pigtail catheter is noted on the right with decrease in the degree of pleural effusion valve repair with the  prior exam. Some loculated components remain primarily laterally and in the minor fissure. Patchy airspace opacities are noted and stable. IMPRESSION: No pneumothorax following drainage catheter placement.  Reduction in right-sided pleural effusion is seen. Persistent airspace opacities are seen. Electronically Signed   By: Inez Catalina M.D.   On: 06/27/2020 19:14   CT IMAGE GUIDED DRAINAGE BY PERCUTANEOUS CATHETER  Result Date: 06/28/2020 INDICATION: 29 year old male with history of parapneumonic effusion secondary to pneumonia. Pigtail thoracostomy tube requested by Thoracic Surgery. EXAM: CT IMAGE GUIDED DRAINAGE BY PERCUTANEOUS CATHETER COMPARISON:  Chest CT from 06/25/2020 MEDICATIONS: The patient is currently admitted to the hospital and receiving intravenous antibiotics. The antibiotics were administered within an appropriate time frame prior to the initiation of the procedure. ANESTHESIA/SEDATION: Moderate (conscious) sedation was employed during this procedure. A total of Versed 2 mg and Fentanyl 100 mcg was administered intravenously. Moderate Sedation Time: 14 minutes. The patient's level of consciousness and vital signs were monitored continuously by radiology nursing throughout the procedure under my direct supervision. CONTRAST:  None COMPLICATIONS: None immediate. PROCEDURE: Informed written consent was obtained from the patient after a discussion of the risks, benefits and alternatives to treatment. The patient was placed supine, partially left lateral decubitus on the CT gantry and a pre procedural CT was performed re-demonstrating the known fluid collection within the right pleural space. The procedure was planned. A timeout was performed prior to the initiation of the procedure. The right posterolateral and inferior thorax was prepped and draped in the usual sterile fashion. The overlying soft tissues were anesthetized with 1% lidocaine with epinephrine. Appropriate trajectory was planned with the use of a 22 gauge spinal needle. An 18 gauge trocar needle was advanced into the pleural space and a short Amplatz super stiff wire was coiled within the collection. Appropriate positioning was  confirmed with a limited CT scan. The tract was serially dilated allowing placement of a 14 French all-purpose drainage catheter. Appropriate positioning was confirmed with a limited postprocedural CT scan. A total of approximately 100 ml of translucent, slightly blood tinged straw-colored fluid was aspirated. Samples were sent to the lab for fluid analysis. The tube was connected to a pleurovac and sutured in place. A dressing was placed. The patient tolerated the procedure well without immediate post procedural complication. IMPRESSION: Successful CT guided placement of a 55 French all purpose drain catheter into the posterolateral and inferior right pleural space with aspiration of approximately 100 mL translucent, slightly blood tinged straw-colored fluid. Samples were sent to the laboratory as requested by the ordering clinical team. Electronically Signed   By: Ruthann Cancer MD   On: 06/28/2020 07:47        Scheduled Meds: . amLODipine  10 mg Oral Daily  . carvedilol  25 mg Oral BID WC  . Chlorhexidine Gluconate Cloth  6 each Topical Q0600  . Darunavir-Cobicisctat-Emtricitabine-Tenofovir Alafenamide  1 tablet Oral Q supper  . epoetin (EPOGEN/PROCRIT) injection  10,000 Units Intravenous Q M,W,F-HD  . feeding supplement (NEPRO CARB STEADY)  237 mL Oral BID BM  . hydrALAZINE  50 mg Oral Q8H  . multivitamin  1 tablet Oral QHS  . sevelamer carbonate  1,600 mg Oral TID WC  . sulfamethoxazole-trimethoprim  1 tablet Oral Daily   Continuous Infusions: . sodium chloride Stopped (06/27/20 2316)  . cefTRIAXone (ROCEPHIN)  IV       LOS: 13 days    Time spent: 15 minutes    Sidney Ace, MD [facility] Pager [phone]  If 7PM-7AM, please contact night-coverage 06/29/2020, 12:51 PM

## 2020-06-29 NOTE — Progress Notes (Signed)
[facility]  ROUNDING NOTE   Subjective:   Seen and examined on hemodialysis treatment. Tolerating treatment well.     HEMODIALYSIS FLOWSHEET:  Blood Flow Rate (mL/min): 400 mL/min Arterial Pressure (mmHg): -170 mmHg Venous Pressure (mmHg): 130 mmHg Transmembrane Pressure (mmHg): 50 mmHg Ultrafiltration Rate (mL/min): 860 mL/min Dialysate Flow Rate (mL/min): 600 ml/min Conductivity: Machine : 13.8 Conductivity: Machine : 13.8 Dialysis Fluid Bolus: Normal Saline Bolus Amount (mL): 250 mL    Objective:  Vital signs in last 24 hours:  Temp:  [98.7 F (37.1 C)-99 F (37.2 C)] 98.8 F (37.1 C) (09/24 0945) Pulse Rate:  [91-99] 99 (09/24 0521) Resp:  [16-20] 16 (09/24 1200) BP: (100-110)/(70-77) 100/70 (09/24 1145) SpO2:  [86 %-91 %] 86 % (09/24 0521)  Weight change:  Filed Weights   06/16/20 2228  Weight: 60.8 kg    Intake/Output: I/O last 3 completed shifts: In: 293.2 [I.V.:43.2; IV Piggyback:250] Out: 703 [Chest YDXA:128]   Intake/Output this shift:  Total I/O In: -  Out: 10 [Chest Tube:10]  Physical Exam: General: NAD  Head:  oral mucosal membranes moist  Eyes: Anicteric  Neck: Supple  Lungs:  Lungs clear, Rt.side Chest tube in place  Heart: regular  Abdomen:  Nontender, non distended  Extremities:  No peripheral edema.  Neurologic: oriented x3  Skin: No acute rashes or lesions  Access: RIJ permcath     Basic Metabolic Panel: Recent Labs  Lab 06/23/20 0911 06/23/20 0911 06/25/20 0812 06/25/20 0812 06/26/20 1012 06/26/20 1012 06/27/20 1015 06/28/20 0409 06/29/20 0434  NA 130*   < > 127*  --  131*  --  130* 133* 130*  K 4.1   < > 4.7  --  4.2  --  4.4 5.4* 5.3*  CL 92*   < > 90*  --  94*  --  93* 95* 92*  CO2 27   < > 21*  --  24  --  24 25 23   GLUCOSE 111*   < > 93  --  153*  --  94 95 91  BUN 40*   < > 78*  --  46*  --  46* 38* 52*  CREATININE 7.66*   < > 12.10*  --  8.61*  --  8.17* 7.85* 10.64*  CALCIUM 7.7*   < > 7.7*    < > 7.8*   < > 8.4* 8.3* 8.3*  PHOS 7.0*  --   --   --   --   --  6.2*  --   --    < > = values in this interval not displayed.    Liver Function Tests: Recent Labs  Lab 06/23/20 0911 06/26/20 1012 06/27/20 1015  AST  --  32  --   ALT  --  29  --   ALKPHOS  --  70  --   BILITOT  --  0.6  --   PROT  --  7.1  --   ALBUMIN 2.2* 2.1* 2.1*   No results for input(s): LIPASE, AMYLASE in the last 168 hours. No results for input(s): AMMONIA in the last 168 hours.  CBC: Recent Labs  Lab 06/25/20 0812 06/25/20 0812 06/26/20 1012 06/26/20 1012 06/27/20 0630 06/27/20 1015 06/28/20 0409 06/29/20 0434 06/29/20 0857  WBC 13.0*   < > 11.1*   < > 11.0* 8.7 11.6* 10.0 10.2  NEUTROABS 9.9*  --  8.4*  --  7.9*  --  8.6* 7.2  --   HGB 7.0*   < >  7.7*   < > 7.6* 7.7* 7.8* 7.7* 8.0*  HCT 21.4*   < > 23.4*   < > 23.1* 23.5* 23.5* 23.6* 23.9*  MCV 83.6   < > 84.2   < > 84.9 83.6 83.3 86.1 83.0  PLT 345   < > 372   < > 420* 407* 448* 440* 457*   < > = values in this interval not displayed.    Cardiac Enzymes: No results for input(s): CKTOTAL, CKMB, CKMBINDEX, TROPONINI in the last 168 hours.  BNP: Invalid input(s): POCBNP  CBG: No results for input(s): GLUCAP in the last 168 hours.  Microbiology: Results for orders placed or performed during the hospital encounter of 06/16/20  SARS Coronavirus 2 by RT PCR (hospital order, performed in [facility] hospital lab) Nasopharyngeal Nasopharyngeal Swab     Status: None   Collection Time: 06/16/20  8:32 AM   Specimen: Nasopharyngeal Swab  Result Value Ref Range Status   SARS Coronavirus 2 NEGATIVE NEGATIVE Final    Comment: (NOTE) SARS-CoV-2 target nucleic acids are NOT DETECTED.  The SARS-CoV-2 RNA is generally detectable in upper and lower respiratory specimens during the acute phase of infection. The lowest concentration of SARS-CoV-2 viral copies this assay can detect is 250 copies / mL. A negative result does not preclude SARS-CoV-2  infection and should not be used as the sole basis for treatment or other patient management decisions.  A negative result may occur with improper specimen collection / handling, submission of specimen other than nasopharyngeal swab, presence of viral mutation(s) within the areas targeted by this assay, and inadequate number of viral copies (<250 copies / mL). A negative result must be combined with clinical observations, patient history, and epidemiological information.  Fact Sheet for Patients:   StrictlyIdeas.no  Fact Sheet for Healthcare Providers: BankingDealers.co.za  This test is not yet approved or  cleared by the [place] FDA and has been authorized for detection and/or diagnosis of SARS-CoV-2 by FDA under an Emergency Use Authorization (EUA).  This EUA will remain in effect (meaning this test can be used) for the duration of the COVID-19 declaration under Section 564(b)(1) of the Act, 21 U.S.C. section 360bbb-3(b)(1), unless the authorization is terminated or revoked sooner.  Performed at [facility], [address] [place], [place] [address]   Blood Culture (routine x 2)     Status: Abnormal   Collection Time: 06/16/20  8:32 AM   Specimen: BLOOD  Result Value Ref Range Status   Specimen Description   Final    BLOOD LEFT FORE ARM Performed at [facility], [address], [place], [place] [address]    Special Requests   Final    BOTTLES DRAWN AEROBIC AND ANAEROBIC Blood Culture results may not be optimal due to an inadequate volume of blood received in culture bottles Performed at [facility], [address] [place], [place] [address]    Culture  Setup Time   Final    GRAM POSITIVE COCCI IN BOTH AEROBIC AND ANAEROBIC BOTTLES CRITICAL VALUE NOTED.  VALUE IS CONSISTENT WITH PREVIOUSLY REPORTED AND CALLED VALUE. Performed at [facility], [address] [place], [place]  [address]    Culture (A)  Final    STREPTOCOCCUS PNEUMONIAE SUSCEPTIBILITIES PERFORMED ON PREVIOUS CULTURE WITHIN THE LAST 5 DAYS. Performed at [facility], [place] [address] [place], [place] [address]    Report Status 06/19/2020 FINAL  Final  Blood Culture (routine x 2)  Status: Abnormal   Collection Time: 06/16/20  8:32 AM   Specimen: BLOOD  Result Value Ref Range Status   Specimen Description   Final    BLOOD LEFT FORE ARM Performed at [facility], [address] [place], [place] [address]    Special Requests   Final    BOTTLES DRAWN AEROBIC AND ANAEROBIC Blood Culture results may not be optimal due to an inadequate volume of blood received in culture bottles Performed at [facility], [address] [place], [place] [address]    Culture  Setup Time   Final    Organism ID to follow [place] CRITICAL RESULT CALLED TO, READ BACK BY AND VERIFIED WITH: [place] 06/16/20 AT 2148 HS Performed at [facility], [place] [address], [place], [place] [address]    Culture STREPTOCOCCUS PNEUMONIAE (A)  Final   Report Status 06/19/2020 FINAL  Final   Organism ID, Bacteria STREPTOCOCCUS PNEUMONIAE  Final      Susceptibility   Streptococcus pneumoniae - MIC*    ERYTHROMYCIN >=8 RESISTANT Resistant     LEVOFLOXACIN 0.5 SENSITIVE Sensitive     VANCOMYCIN 0.5 SENSITIVE Sensitive     PENICILLIN (meningitis) 1 RESISTANT Resistant     PENO - penicillin 1      PENICILLIN (non-meningitis) 1 SENSITIVE Sensitive     PENICILLIN (oral) 1 INTERMEDIATE Intermediate     CEFTRIAXONE (non-meningitis) 1 SENSITIVE Sensitive     CEFTRIAXONE (meningitis) 1 INTERMEDIATE Intermediate     * STREPTOCOCCUS PNEUMONIAE  Blood Culture ID Panel (Reflexed)     Status: Abnormal   Collection Time: 06/16/20  8:32 AM  Result Value Ref Range Status   Enterococcus faecalis NOT DETECTED NOT DETECTED Final   Enterococcus Faecium NOT DETECTED  NOT DETECTED Final   Listeria monocytogenes NOT DETECTED NOT DETECTED Final   Staphylococcus species NOT DETECTED NOT DETECTED Final   Staphylococcus aureus (BCID) NOT DETECTED NOT DETECTED Final   Staphylococcus epidermidis NOT DETECTED NOT DETECTED Final   Staphylococcus lugdunensis NOT DETECTED NOT DETECTED Final   Streptococcus species DETECTED (A) NOT DETECTED Final    Comment: CRITICAL RESULT CALLED TO, READ BACK BY AND VERIFIED WITH: DAVID BESANTI 06/16/20 AT 2148 HS    Streptococcus agalactiae NOT DETECTED NOT DETECTED Final   Streptococcus pneumoniae DETECTED (A) NOT DETECTED Final    Comment: CRITICAL RESULT CALLED TO, READ BACK BY AND VERIFIED WITH: DAVID BESANTI 06/16/20 AT 2148 HS    Streptococcus pyogenes NOT DETECTED NOT DETECTED Final   A.calcoaceticus-baumannii NOT DETECTED NOT DETECTED Final   Bacteroides fragilis NOT DETECTED NOT DETECTED Final   Enterobacterales NOT DETECTED NOT DETECTED Final   Enterobacter cloacae complex NOT DETECTED NOT DETECTED Final   Escherichia coli NOT DETECTED NOT DETECTED Final   Klebsiella aerogenes NOT DETECTED NOT DETECTED Final   Klebsiella oxytoca NOT DETECTED NOT DETECTED Final   Klebsiella pneumoniae NOT DETECTED NOT DETECTED Final   Proteus species NOT DETECTED NOT DETECTED Final   Salmonella species NOT DETECTED NOT DETECTED Final   Serratia marcescens NOT DETECTED NOT DETECTED Final   Haemophilus influenzae NOT DETECTED NOT DETECTED Final   Neisseria meningitidis NOT DETECTED NOT DETECTED Final   Pseudomonas aeruginosa NOT DETECTED NOT DETECTED Final   Stenotrophomonas maltophilia NOT DETECTED NOT DETECTED Final   Candida albicans NOT DETECTED NOT DETECTED Final   Candida auris NOT DETECTED NOT DETECTED Final   Candida glabrata NOT DETECTED NOT DETECTED Final  Candida krusei NOT DETECTED NOT DETECTED Final   Candida parapsilosis NOT DETECTED NOT DETECTED Final   Candida tropicalis NOT DETECTED NOT DETECTED Final    Cryptococcus neoformans/gattii NOT DETECTED NOT DETECTED Final    Comment: Performed at [facility], [address] [place], [place] [address]  CULTURE, BLOOD (ROUTINE X 2) w Reflex to ID Panel     Status: None (Preliminary result)   Collection Time: 06/25/20  7:13 PM   Specimen: BLOOD  Result Value Ref Range Status   Specimen Description BLOOD LEFT ANTECUBITAL  Final   Special Requests   Final    BOTTLES DRAWN AEROBIC AND ANAEROBIC Blood Culture adequate volume   Culture   Final    NO GROWTH 4 DAYS Performed at [facility], [address] [place], [place] [address]    Report Status PENDING  Incomplete  CULTURE, BLOOD (ROUTINE X 2) w Reflex to ID Panel     Status: None (Preliminary result)   Collection Time: 06/25/20  7:27 PM   Specimen: BLOOD  Result Value Ref Range Status   Specimen Description BLOOD BLOOD LEFT HAND  Final   Special Requests   Final    BOTTLES DRAWN AEROBIC AND ANAEROBIC Blood Culture adequate volume   Culture   Final    NO GROWTH 4 DAYS Performed at [facility], [address] [place], [place] [address]    Report Status PENDING  Incomplete  MRSA PCR Screening     Status: None   Collection Time: 06/26/20  8:50 PM   Specimen: Nasal Mucosa; Nasopharyngeal  Result Value Ref Range Status   MRSA by PCR NEGATIVE NEGATIVE Final    Comment:        The GeneXpert MRSA Assay (FDA approved for NASAL specimens only), is one component of a comprehensive MRSA colonization surveillance program. It is not intended to diagnose MRSA infection nor to guide or monitor treatment for MRSA infections. Performed at [facility], [address] [place], [place] [address]   Aerobic/Anaerobic Culture (surgical/deep wound)     Status: None (Preliminary result)   Collection Time: 06/27/20  4:47 PM   Specimen: Pleural Fluid  Result Value Ref Range Status   Specimen Description   Final    PLEURAL Performed at [facility],  [address] [place], [place] [address]    Special Requests   Final    PLEURAL FLUID CHEST Performed at [facility], [address] [place], [place] [address]    Gram Stain   Final    RARE WBC PRESENT,BOTH PMN AND MONONUCLEAR NO ORGANISMS SEEN    Culture   Final    NO GROWTH 2 DAYS NO ANAEROBES ISOLATED; CULTURE IN PROGRESS FOR 5 DAYS Performed at [facility], [place] [address], [place],  [address]    Report Status PENDING  Incomplete    Coagulation Studies: No results for input(s): LABPROT, INR in the last 72 hours.  Urinalysis: No results for input(s): COLORURINE, LABSPEC, PHURINE, GLUCOSEU, HGBUR, BILIRUBINUR, KETONESUR, PROTEINUR, UROBILINOGEN, NITRITE, LEUKOCYTESUR in the last 72 hours.  Invalid input(s): APPERANCEUR    Imaging: DG Chest Port 1 View  Result Date: 06/27/2020 CLINICAL DATA:  Status post right-sided pigtail catheter drainage in the pleural space EXAM: PORTABLE CHEST 1 VIEW COMPARISON:  06/25/2020 FINDINGS: Cardiac shadow is enlarged but stable. Dialysis catheter is again seen. New pigtail catheter is noted on the right with decrease in the degree of pleural effusion valve repair with  the prior exam. Some loculated components remain primarily laterally and in the minor fissure. Patchy airspace opacities are noted and stable. IMPRESSION: No pneumothorax following drainage catheter placement. Reduction in right-sided pleural effusion is seen. Persistent airspace opacities are seen. Electronically Signed   By: Inez Catalina M.D.   On: 06/27/2020 19:14   CT IMAGE GUIDED DRAINAGE BY PERCUTANEOUS CATHETER  Result Date: 06/28/2020 INDICATION: 29 year old male with history of parapneumonic effusion secondary to pneumonia. Pigtail thoracostomy tube requested by Thoracic Surgery. EXAM: CT IMAGE GUIDED DRAINAGE BY PERCUTANEOUS CATHETER COMPARISON:  Chest CT from 06/25/2020 MEDICATIONS: The patient is currently admitted to the hospital and receiving  intravenous antibiotics. The antibiotics were administered within an appropriate time frame prior to the initiation of the procedure. ANESTHESIA/SEDATION: Moderate (conscious) sedation was employed during this procedure. A total of Versed 2 mg and Fentanyl 100 mcg was administered intravenously. Moderate Sedation Time: 14 minutes. The patient's level of consciousness and vital signs were monitored continuously by radiology nursing throughout the procedure under my direct supervision. CONTRAST:  None COMPLICATIONS: None immediate. PROCEDURE: Informed written consent was obtained from the patient after a discussion of the risks, benefits and alternatives to treatment. The patient was placed supine, partially left lateral decubitus on the CT gantry and a pre procedural CT was performed re-demonstrating the known fluid collection within the right pleural space. The procedure was planned. A timeout was performed prior to the initiation of the procedure. The right posterolateral and inferior thorax was prepped and draped in the usual sterile fashion. The overlying soft tissues were anesthetized with 1% lidocaine with epinephrine. Appropriate trajectory was planned with the use of a 22 gauge spinal needle. An 18 gauge trocar needle was advanced into the pleural space and a short Amplatz super stiff wire was coiled within the collection. Appropriate positioning was confirmed with a limited CT scan. The tract was serially dilated allowing placement of a 14 French all-purpose drainage catheter. Appropriate positioning was confirmed with a limited postprocedural CT scan. A total of approximately 100 ml of translucent, slightly blood tinged straw-colored fluid was aspirated. Samples were sent to the lab for fluid analysis. The tube was connected to a pleurovac and sutured in place. A dressing was placed. The patient tolerated the procedure well without immediate post procedural complication. IMPRESSION: Successful CT guided  placement of a 46 French all purpose drain catheter into the posterolateral and inferior right pleural space with aspiration of approximately 100 mL translucent, slightly blood tinged straw-colored fluid. Samples were sent to the laboratory as requested by the ordering clinical team. Electronically Signed   By: Ruthann Cancer MD   On: 06/28/2020 07:47     Medications:   . sodium chloride Stopped (06/27/20 2316)  . cefTRIAXone (ROCEPHIN)  IV     . amLODipine  10 mg Oral Daily  . carvedilol  25 mg Oral BID WC  . Chlorhexidine Gluconate Cloth  6 each Topical Q0600  . Darunavir-Cobicisctat-Emtricitabine-Tenofovir Alafenamide  1 tablet Oral Q supper  . epoetin (EPOGEN/PROCRIT) injection  10,000 Units Intravenous Q M,W,F-HD  . feeding supplement (NEPRO CARB STEADY)  237 mL Oral BID BM  . hydrALAZINE  50 mg Oral Q8H  . multivitamin  1 tablet Oral QHS  . sevelamer carbonate  1,600 mg Oral TID WC  . sulfamethoxazole-trimethoprim  1 tablet Oral Daily   sodium chloride, acetaminophen, chlorpheniramine-HYDROcodone, fentaNYL, guaiFENesin, hydrALAZINE, labetalol, morphine injection, ondansetron (ZOFRAN) IV, oxyCODONE-acetaminophen, promethazine  Assessment/ Plan:  Mr. Daniel Combs is a 29 y.o.  male with a PMHx oflongstanding hypertension, ESRD not yet on dialysis, anemia of chronic kidney disease, secondary hyperparathyroidism,HIV,who was admitted to [facility] on9/11/2021for evaluation of shortness of breath and chest pain.   1. ESRDinitiated on dialysis this admission Patient was last seen by nephrologist in [place] approximately 1 year ago. 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 Kidney is atrophic on renal ultrasound.  - continue MWF dialysis  schedule  -Outpatient hemodialysis center placement at [place].  2. Hypertension. Current regimen of amlodipine and amlodipine  3. Anemia of chronic kidney disease. -Received PRBC transfusion during this admission - Continue Epogen with dilaysis - Avoid further blood transfusions if possible; due to possibility of a future transplant    4. Secondary Hyperparathyroidism: PTH 645 on this admission.  -Continue Sevelamer with meals -Will monitor labs       LOS: 13 Daniel Combs 9/24/202112:40 PM

## 2020-06-30 LAB — CBC WITH DIFFERENTIAL/PLATELET
Abs Immature Granulocytes: 0.05 10*3/uL (ref 0.00–0.07)
Basophils Absolute: 0 10*3/uL (ref 0.0–0.1)
Basophils Relative: 1 %
Eosinophils Absolute: 0.3 10*3/uL (ref 0.0–0.5)
Eosinophils Relative: 4 %
HCT: 23.8 % — ABNORMAL LOW (ref 39.0–52.0)
Hemoglobin: 7.9 g/dL — ABNORMAL LOW (ref 13.0–17.0)
Immature Granulocytes: 1 %
Lymphocytes Relative: 10 %
Lymphs Abs: 0.7 10*3/uL (ref 0.7–4.0)
MCH: 27.8 pg (ref 26.0–34.0)
MCHC: 33.2 g/dL (ref 30.0–36.0)
MCV: 83.8 fL (ref 80.0–100.0)
Monocytes Absolute: 1.1 10*3/uL — ABNORMAL HIGH (ref 0.1–1.0)
Monocytes Relative: 15 %
Neutro Abs: 5.4 10*3/uL (ref 1.7–7.7)
Neutrophils Relative %: 69 %
Platelets: 353 10*3/uL (ref 150–400)
RBC: 2.84 MIL/uL — ABNORMAL LOW (ref 4.22–5.81)
RDW: 14.1 % (ref 11.5–15.5)
WBC: 7.6 10*3/uL (ref 4.0–10.5)
nRBC: 0 % (ref 0.0–0.2)

## 2020-06-30 LAB — BASIC METABOLIC PANEL
Anion gap: 11 (ref 5–15)
BUN: 35 mg/dL — ABNORMAL HIGH (ref 6–20)
CO2: 25 mmol/L (ref 22–32)
Calcium: 8.4 mg/dL — ABNORMAL LOW (ref 8.9–10.3)
Chloride: 95 mmol/L — ABNORMAL LOW (ref 98–111)
Creatinine, Ser: 8.04 mg/dL — ABNORMAL HIGH (ref 0.61–1.24)
GFR calc Af Amer: 9 mL/min — ABNORMAL LOW (ref >60)
GFR calc non Af Amer: 8 mL/min — ABNORMAL LOW (ref >60)
Glucose, Bld: 105 mg/dL — ABNORMAL HIGH (ref 70–99)
Potassium: 4.8 mmol/L (ref 3.5–5.1)
Sodium: 131 mmol/L — ABNORMAL LOW (ref 135–145)

## 2020-06-30 LAB — CULTURE, BLOOD (ROUTINE X 2)
Culture: NO GROWTH
Culture: NO GROWTH
Special Requests: ADEQUATE
Special Requests: ADEQUATE

## 2020-06-30 NOTE — Progress Notes (Signed)
Daniel Combs  MRN: [id number]  DOB/AGE: 03-Oct-1991 29 y.o.  Primary Care Physician:Pcp, No  Admit date: 06/16/2020  Chief Complaint:  Chief Complaint  Patient presents with  . Chest Pain  . Cough  . Back Pain    S-Pt presented on  06/16/2020 with  Chief Complaint  Patient presents with  . Chest Pain  . Cough  . Back Pain  .    Pt today feels better  Medications . amLODipine  10 mg Oral Daily  . carvedilol  25 mg Oral BID WC  . Chlorhexidine Gluconate Cloth  6 each Topical Q0600  . Darunavir-Cobicisctat-Emtricitabine-Tenofovir Alafenamide  1 tablet Oral Q supper  . epoetin (EPOGEN/PROCRIT) injection  10,000 Units Intravenous Q M,W,F-HD  . feeding supplement (NEPRO CARB STEADY)  237 mL Oral BID BM  . hydrALAZINE  50 mg Oral Q8H  . multivitamin  1 tablet Oral QHS  . sevelamer carbonate  1,600 mg Oral TID WC  . sulfamethoxazole-trimethoprim  1 tablet Oral Daily         WGY:[id number] from the symptoms mentioned above,there are no other symptoms referable to all systems reviewed.  Physical Exam: Vital signs in last 24 hours: Temp:  [98.1 F (36.7 C)-98.6 F (37 C)] 98.6 F (37 C) (09/25 1148) Pulse Rate:  [92-99] 92 (09/25 1148) Resp:  [16-20] 20 (09/25 0540) BP: (103-114)/(62-70) 103/62 (09/25 1148) SpO2:  [91 %-94 %] 91 % (09/25 1148) Weight change:  Last BM Date: 06/28/20  Intake/Output from previous day: 09/24 0701 - 09/25 0700 In: -  Out: 2020 [Chest Tube:20] No intake/output data recorded.   Physical Exam: General- pt is awake,alert, oriented to time place and person Resp- No acute REsp distress, decreased at bases, right pigtail catheter in situ CVS- S1S2 regular in rate and rhythm GIT- BS+, soft, NT, ND EXT- NO LE Edema, Cyanosis Access tunneled catheter in situ on the right side  Lab Results: CBC Recent Labs    06/29/20 0857 06/30/20 0544  WBC 10.2 7.6  HGB 8.0* 7.9*  HCT 23.9* 23.8*  PLT 457* 353    BMET Recent Labs     06/29/20 0434 06/30/20 0544  NA 130* 131*  K 5.3* 4.8  CL 92* 95*  CO2 23 25  GLUCOSE 91 105*  BUN 52* 35*  CREATININE 10.64* 8.04*  CALCIUM 8.3* 8.4*    MICRO Recent Results (from the past 240 hour(s))  CULTURE, BLOOD (ROUTINE X 2) w Reflex to ID Panel     Status: None   Collection Time: 06/25/20  7:13 PM   Specimen: BLOOD  Result Value Ref Range Status   Specimen Description BLOOD LEFT ANTECUBITAL  Final   Special Requests   Final    BOTTLES DRAWN AEROBIC AND ANAEROBIC Blood Culture adequate volume   Culture   Final    NO GROWTH 5 DAYS Performed at [facility], [address] [place], [place] [address]    Report Status 06/30/2020 FINAL  Final  CULTURE, BLOOD (ROUTINE X 2) w Reflex to ID Panel     Status: None   Collection Time: 06/25/20  7:27 PM   Specimen: BLOOD  Result Value Ref Range Status   Specimen Description BLOOD BLOOD LEFT HAND  Final   Special Requests   Final    BOTTLES DRAWN AEROBIC AND ANAEROBIC Blood Culture adequate volume   Culture   Final    NO GROWTH 5 DAYS Performed at [facility], [address], [place], [place] [address]  Report Status 06/30/2020 FINAL  Final  MRSA PCR Screening     Status: None   Collection Time: 06/26/20  8:50 PM   Specimen: Nasal Mucosa; Nasopharyngeal  Result Value Ref Range Status   MRSA by PCR NEGATIVE NEGATIVE Final    Comment:        The GeneXpert MRSA Assay (FDA approved for NASAL specimens only), is one component of a comprehensive MRSA colonization surveillance program. It is not intended to diagnose MRSA infection nor to guide or monitor treatment for MRSA infections. Performed at [facility], [address] [place], [place] [address]   Aerobic/Anaerobic Culture (surgical/deep wound)     Status: None (Preliminary result)   Collection Time: 06/27/20  4:47 PM   Specimen: Pleural Fluid  Result Value Ref Range Status   Specimen Description   Final    PLEURAL Performed  at [facility], [address] [place], [place] [address]    Special Requests   Final    PLEURAL FLUID CHEST Performed at [facility], [place], [place] [address]    Gram Stain   Final    RARE WBC PRESENT,BOTH PMN AND MONONUCLEAR NO ORGANISMS SEEN    Culture   Final    NO GROWTH 3 DAYS NO ANAEROBES ISOLATED; CULTURE IN PROGRESS FOR 5 DAYS Performed at [facility], [address], [place], [place] [address]    Report Status PENDING  Incomplete      Lab Results  Component Value Date   PTH 654 (H) 06/17/2020   CALCIUM 8.4 (L) 06/30/2020   PHOS 6.2 (H) 06/27/2020               Impression:   [name] is a 29 y.o.  male with a PMHx oflongstanding hypertension, ESRD not yet on dialysis, anemia of chronic kidney disease, secondary hyperparathyroidism,HIV,who was admitted to [facility] on9/11/2021for evaluation of shortness of breath and chest pain.   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. for outpatient treatment  2)HTN Blood pressure is at goal  3)Anemia of chronic disease  HGb is not at goal (9--11) Patient is on Epogen protocol  4) secondary hyperparathyroidism -CKD Mineral-Bone Disorder   Secondary Hyperparathyroidism  present  Phosphorus is not at goal. Initiation of renal replacement therapy should help Patient is on binders  5) sepsis Patient had sepsis secondary to Streptococcus pneumonia bacteremia Patient also developed complex loculated right pleural effusion Patient underwent pigtail catheter for drainage of his effusion on September 22 Patient then underwent TPA instillation on September 23 Patient is on broad-spectrum antibiotics Patient is being closely followed by hospitalist team/surgery  team   6) electrolytes   sodium Hyponatremia Secondary to ESRD   potassium Normokalemic    7)Acid base Co2 at goal     Plan:  No need for renal placement therapy today     Glee Lashomb s Even [place] 06/30/2020, 12:03 PM

## 2020-06-30 NOTE — Progress Notes (Signed)
PROGRESS NOTE    Daniel Combs  WVP:[id number] DOB: 1991/06/06 DOA: 06/16/2020 PCP: Pcp, No    Brief Narrative:  29 year old male with history of untreated HIV, hypertension, untreated chronic kidney disease progressing to ESRD who was initially admitted on 06/16/2020 with cough and back pain of 1 week duration.  Has not been adherent to antihypertensive or HIV treatment.  Blood cultures found to be positive for Streptococcus pneumonia.  Associated large right-sided empyema.  Infectious disease, nephrology, cardiothoracic surgery, interventional radiology on consult.  Initially met for sepsis secondary to community-acquired pneumonia.  Completed 10 days of IV Rocephin.  This is when the repeat chest CT showed loculated right pleural effusion.  Antibiotics expanded.  Nephrology consulted for ESRD.  Vascular surgery placed tunneled hemodialysis catheter 06/18/2020.  New start hemodialysis this admission.  Patient status post placement of an image guided right-sided pigtail catheter 06/27/2020.  Cardiothoracic surgery following for intrapleural TPA instillation.  Patient reported some back pain at the chest tube insertion site after first TPA instillation   Assessment & Plan:   Principal Problem:   Sepsis ([place]) Active Problems:   CAP (community acquired pneumonia)   Hypertension   ESRD (end stage renal disease) ([place])   Hyponatremia   Hyperkalemia   Elevated troponin   Anemia in ESRD (end-stage renal disease) ([place])   Bacteremia due to Streptococcus pneumoniae   Pleural effusion on right  Sepsis secondary to Streptococcus pneumonia bacteremia Complex loculated right pleural effusion secondary to above Plan: Cardiothoracic surgery following Recommend IR guided placement of right-sided pigtail catheter Status post right-sided pigtail catheter 06/27/2020 CTS following, status post pleural TPA instillation 06/28/2020 On broad-spectrum IV antibiotics, ID following  AIDS Patient has been  nonadherent to his HIV treatment started on Symtuza during this admission On Bactrim for P JP prophylaxis Infectious disease following  Accelerated hypertension Reasonable control on 4 medications  End-stage renal disease on hemodialysis New HD start this admission Nephrology consult following for HD needs Outpatient HD chair found [place] Monday Wednesday Friday 6:30 AM   DVT prophylaxis: Subcu heparin Code Status: Full Family Communication: None today Disposition Plan: Status is: Inpatient  Remains inpatient appropriate because:Inpatient level of care appropriate due to severity of illness   Dispo: The patient is from: Home              Anticipated d/c is to: Home              Anticipated d/c date is: > 3 days              Patient currently is not medically stable to d/c.  Bedside chest tube in place.  CTS following for chest tube management.   Consultants:   Nephrology  Infectious disease Cardiothoracic surgery Interventional radiology  Procedures:   Right-sided chest tube, 06/27/2020  Antimicrobials:   Vancomycin  Cefepime   Subjective: Seen and examined.  Endorses back pain after TPA instillation yesterday  Objective: Vitals:   06/29/20 1259 06/29/20 2011 06/30/20 0540 06/30/20 1148  BP: 107/67 111/69 114/70 103/62  Pulse:  97 99 92  Resp: _0 Temp:  98.3 F (36.8 C) 98.1 F (36.7 C) 98.6 F (37 C)  TempSrc:  Oral Oral Oral  SpO2:  94% 93% 91%  Weight:      Height:        Intake/Output Summary (Last 24 hours) at 06/30/2020 1225 Last data filed at 06/29/2020 1425 Gross per 24 hour  Intake --  Output 2010  ml  Net -2010 ml   Filed Weights   06/16/20 2228  Weight: 60.8 kg    Examination:  General exam: Appears calm and comfortable  Respiratory system: Decreased breath sounds on the right.  Right-sided pigtail catheter.  Normal work of breathing.  Room air Cardiovascular system: S1 & S2 heard, RRR. No JVD, murmurs,  rubs, gallops or clicks. No pedal edema. Gastrointestinal system: Abdomen is nondistended, soft and nontender. No organomegaly or masses felt. Normal bowel sounds heard. Central nervous system: Alert and oriented. No focal neurological deficits. Extremities: Symmetric 5 x 5 power. Skin: No rashes, lesions or ulcers Psychiatry: Judgement and insight appear normal. Mood & affect appropriate.     Data Reviewed: I have personally reviewed following labs and imaging studies  CBC: Recent Labs  Lab 06/26/20 1012 06/26/20 1012 06/27/20 0630 06/27/20 0630 06/27/20 1015 06/28/20 0409 06/29/20 0434 06/29/20 0857 06/30/20 0544  WBC 11.1*   < > 11.0*   < > 8.7 11.6* 10.0 10.2 7.6  NEUTROABS 8.4*  --  7.9*  --   --  8.6* 7.2  --  5.4  HGB 7.7*   < > 7.6*   < > 7.7* 7.8* 7.7* 8.0* 7.9*  HCT 23.4*   < > 23.1*   < > 23.5* 23.5* 23.6* 23.9* 23.8*  MCV 84.2   < > 84.9   < > 83.6 83.3 86.1 83.0 83.8  PLT 372   < > 420*   < > 407* 448* 440* 457* 353   < > = values in this interval not displayed.   Basic Metabolic Panel: Recent Labs  Lab 06/26/20 1012 06/27/20 1015 06/28/20 0409 06/29/20 0434 06/30/20 0544  NA 131* 130* 133* 130* 131*  K 4.2 4.4 5.4* 5.3* 4.8  CL 94* 93* 95* 92* 95*  CO2 _0 GLUCOSE 153* 94 95 91 105*  BUN 46* 46* 38* 52* 35*  CREATININE 8.61* 8.17* 7.85* 10.64* 8.04*  CALCIUM 7.8* 8.4* 8.3* 8.3* 8.4*  PHOS  --  6.2*  --   --   --    GFR: Estimated Creatinine Clearance: 11.6 mL/min (A) (by C-G formula based on SCr of 8.04 mg/dL (H)). Liver Function Tests: Recent Labs  Lab 06/26/20 1012 06/27/20 1015  AST 32  --   ALT 29  --   ALKPHOS 70  --   BILITOT 0.6  --   PROT 7.1  --   ALBUMIN 2.1* 2.1*   No results for input(s): LIPASE, AMYLASE in the last 168 hours. No results for input(s): AMMONIA in the last 168 hours. Coagulation Profile: No results for input(s): INR, PROTIME in the last 168 hours. Cardiac Enzymes: No results for input(s): CKTOTAL,  CKMB, CKMBINDEX, TROPONINI in the last 168 hours. BNP (last 3 results) No results for input(s): PROBNP in the last 8760 hours. HbA1C: No results for input(s): HGBA1C in the last 72 hours. CBG: No results for input(s): GLUCAP in the last 168 hours. Lipid Profile: No results for input(s): CHOL, HDL, LDLCALC, TRIG, CHOLHDL, LDLDIRECT in the last 72 hours. Thyroid Function Tests: No results for input(s): TSH, T4TOTAL, FREET4, T3FREE, THYROIDAB in the last 72 hours. Anemia Panel: No results for input(s): VITAMINB12, FOLATE, FERRITIN, TIBC, IRON, RETICCTPCT in the last 72 hours. Sepsis Labs: Recent Labs  Lab 06/25/20 1911 06/25/20 1927 06/26/20 0518 06/27/20 0630  PROCALCITON 11.77  --  9.18 <0.10  LATICACIDVEN  --  0.8  --   --  Recent Results (from the past 240 hour(s))  CULTURE, BLOOD (ROUTINE X 2) w Reflex to ID Panel     Status: None   Collection Time: 06/25/20  7:13 PM   Specimen: BLOOD  Result Value Ref Range Status   Specimen Description BLOOD LEFT ANTECUBITAL  Final   Special Requests   Final    BOTTLES DRAWN AEROBIC AND ANAEROBIC Blood Culture adequate volume   Culture   Final    NO GROWTH 5 DAYS Performed at [facility], [address] [place], [place] [address]    Report Status 06/30/2020 FINAL  Final  CULTURE, BLOOD (ROUTINE X 2) w Reflex to ID Panel     Status: None   Collection Time: 06/25/20  7:27 PM   Specimen: BLOOD  Result Value Ref Range Status   Specimen Description BLOOD BLOOD LEFT HAND  Final   Special Requests   Final    BOTTLES DRAWN AEROBIC AND ANAEROBIC Blood Culture adequate volume   Culture   Final    NO GROWTH 5 DAYS Performed at [facility], [address] [place], [place] [address]    Report Status 06/30/2020 FINAL  Final  MRSA PCR Screening     Status: None   Collection Time: 06/26/20  8:50 PM   Specimen: Nasal Mucosa; Nasopharyngeal  Result Value Ref Range Status   MRSA by PCR NEGATIVE NEGATIVE Final     Comment:        The GeneXpert MRSA Assay (FDA approved for NASAL specimens only), is one component of a comprehensive MRSA colonization surveillance program. It is not intended to diagnose MRSA infection nor to guide or monitor treatment for MRSA infections. Performed at [facility], [address], [place], [place] [address]   Aerobic/Anaerobic Culture (surgical/deep wound)     Status: None (Preliminary result)   Collection Time: 06/27/20  4:47 PM   Specimen: Pleural Fluid  Result Value Ref Range Status   Specimen Description   Final    PLEURAL Performed at [facility], [address] [place], [place] [address]    Special Requests   Final    PLEURAL FLUID CHEST Performed at [facility], [place], [place] [address]    Gram Stain   Final    RARE WBC PRESENT,BOTH PMN AND MONONUCLEAR NO ORGANISMS SEEN    Culture   Final    NO GROWTH 3 DAYS NO ANAEROBES ISOLATED; CULTURE IN PROGRESS FOR 5 DAYS Performed at [facility], [address], [place], [place] [address]    Report Status PENDING  Incomplete         Radiology Studies: DG Chest Port 1 View  Result Date: 06/29/2020 CLINICAL DATA:  Pleural effusion. EXAM: PORTABLE CHEST 1 VIEW COMPARISON:  06/27/2020.  CT 06/27/2020. FINDINGS: Dual-lumen catheter right chest tube in stable position. Mediastinum hilar structures normal. Stable cardiomegaly. Bilateral pulmonary infiltrates are again noted. Interim partial resolution of right pleural effusion from prior exam. No pneumothorax IMPRESSION: 1. Right chest tube in stable position. Interim partial resolution of right pleural effusion from prior exam. 2. Bilateral pulmonary infiltrates again noted without interim change. 3.  Stable cardiomegaly. Electronically Signed   By: [place]   On: 06/29/2020 14:17        Scheduled Meds: . amLODipine  10 mg Oral Daily  . carvedilol  25 mg Oral BID WC  . Chlorhexidine  Gluconate Cloth  6 each Topical Q0600  . Darunavir-Cobicisctat-Emtricitabine-Tenofovir Alafenamide  1 tablet Oral Q supper  . epoetin (EPOGEN/PROCRIT) injection  10,000 Units Intravenous Q M,W,F-HD  . feeding supplement (NEPRO CARB STEADY)  237 mL Oral BID BM  . hydrALAZINE  50 mg Oral Q8H  . multivitamin  1 tablet Oral QHS  . sevelamer carbonate  1,600 mg Oral TID WC  . sulfamethoxazole-trimethoprim  1 tablet Oral Daily   Continuous Infusions: . sodium chloride Stopped (06/27/20 2316)  . cefTRIAXone (ROCEPHIN)  IV 2 g (06/29/20 2123)     LOS: 14 days    Time spent: 15 minutes    Sidney Ace, MD [facility] Pager [phone]  If 7PM-7AM, please contact night-coverage 06/30/2020, 12:25 PM

## 2020-06-30 NOTE — Progress Notes (Signed)
CC: Left complex pleural effusion Subjective: Feeling better this am,. No SOB. CXR from yesterday showed improvement ( pers, reviewed). 20cc from ct, no PTX   Objective: Vital signs in last 24 hours: Temp:  [98.1 F (36.7 C)-98.6 F (37 C)] 98.6 F (37 C) (09/25 1148) Pulse Rate:  [92-99] 92 (09/25 1148) Resp:  [20] 20 (09/25 0540) BP: (103-114)/(62-70) 103/62 (09/25 1148) SpO2:  [91 %-94 %] 91 % (09/25 1148) Last BM Date: 06/28/20  Intake/Output from previous day: 09/24 0701 - 09/25 0700 In: -  Out: 2020 [Chest Tube:20] Intake/Output this shift: No intake/output data recorded.  Physical exam:  NAD alert Chest: decrease LEft side. CT in place w/o air leak. Abd: soft, nt  Lab Results: CBC  Recent Labs    06/29/20 0857 06/30/20 0544  WBC 10.2 7.6  HGB 8.0* 7.9*  HCT 23.9* 23.8*  PLT 457* 353   BMET Recent Labs    06/29/20 0434 06/30/20 0544  NA 130* 131*  K 5.3* 4.8  CL 92* 95*  CO2 23 25  GLUCOSE 91 105*  BUN 52* 35*  CREATININE 10.64* 8.04*  CALCIUM 8.3* 8.4*   PT/INR No results for input(s): LABPROT, INR in the last 72 hours. ABG No results for input(s): PHART, HCO3 in the last 72 hours.  Invalid input(s): PCO2, PO2  Studies/Results: DG Chest Port 1 View  Result Date: 06/29/2020 CLINICAL DATA:  Pleural effusion. EXAM: PORTABLE CHEST 1 VIEW COMPARISON:  06/27/2020.  CT 06/27/2020. FINDINGS: Dual-lumen catheter right chest tube in stable position. Mediastinum hilar structures normal. Stable cardiomegaly. Bilateral pulmonary infiltrates are again noted. Interim partial resolution of right pleural effusion from prior exam. No pneumothorax IMPRESSION: 1. Right chest tube in stable position. Interim partial resolution of right pleural effusion from prior exam. 2. Bilateral pulmonary infiltrates again noted without interim change. 3.  Stable cardiomegaly. Electronically Signed   By: Marcello Moores  Register   On: 06/29/2020 14:17     Anti-infectives: Anti-infectives (From admission, onward)   Start     Dose/Rate Route Frequency Ordered Stop   06/29/20 2000  cefTRIAXone (ROCEPHIN) 2 g in sodium chloride 0.9 % 100 mL IVPB        2 g 200 mL/hr over 30 Minutes Intravenous Every 24 hours 06/29/20 1036     06/27/20 1200  vancomycin (VANCOCIN) IVPB 500 mg/100 ml premix  Status:  Discontinued        500 mg 100 mL/hr over 60 Minutes Intravenous Every M-W-F (Hemodialysis) 06/25/20 1924 06/27/20 1140   06/27/20 1200  vancomycin (VANCOCIN) IVPB 750 mg/150 ml premix  Status:  Discontinued        750 mg 150 mL/hr over 60 Minutes Intravenous Every M-W-F (Hemodialysis) 06/27/20 1140 06/29/20 1035   06/25/20 2000  vancomycin (VANCOREADY) IVPB 1250 mg/250 mL        1,250 mg 166.7 mL/hr over 90 Minutes Intravenous  Once 06/25/20 1924 06/25/20 2256   06/25/20 2000  ceFEPIme (MAXIPIME) 1 g in sodium chloride 0.9 % 100 mL IVPB  Status:  Discontinued        1 g 200 mL/hr over 30 Minutes Intravenous Every 24 hours 06/25/20 1924 06/29/20 1035   06/25/20 1700  Darunavir-Cobicisctat-Emtricitabine-Tenofovir Alafenamide (SYMTUZA) 800-150-200-10 MG TABS 1 tablet        1 tablet Oral Daily with supper 06/25/20 1320     06/19/20 2000  sulfamethoxazole-trimethoprim (BACTRIM) 400-80 MG per tablet 1 tablet        1 tablet Oral Daily 06/19/20 1729  06/16/20 1130  cefTRIAXone (ROCEPHIN) 2 g in sodium chloride 0.9 % 100 mL IVPB  Status:  Discontinued        2 g 200 mL/hr over 30 Minutes Intravenous Every 24 hours 06/16/20 1123 06/25/20 1924   06/16/20 1130  azithromycin (ZITHROMAX) 500 mg in sodium chloride 0.9 % 250 mL IVPB  Status:  Discontinued        500 mg 250 mL/hr over 60 Minutes Intravenous Every 24 hours 06/16/20 1123 06/18/20 2016      Assessment/Plan: Pleural efffusion, continue CT to suction, cxr in am pulm toilet ( pt hesitant to do I/S acapella) I spent 25 minutes in this encounter  Caroleen Hamman, MD,  FACS  06/30/2020

## 2020-07-01 ENCOUNTER — Inpatient Hospital Stay: Payer: Medicaid Other

## 2020-07-01 LAB — BASIC METABOLIC PANEL
Anion gap: 15 (ref 5–15)
BUN: 47 mg/dL — ABNORMAL HIGH (ref 6–20)
CO2: 24 mmol/L (ref 22–32)
Calcium: 8.6 mg/dL — ABNORMAL LOW (ref 8.9–10.3)
Chloride: 92 mmol/L — ABNORMAL LOW (ref 98–111)
Creatinine, Ser: 10.64 mg/dL — ABNORMAL HIGH (ref 0.61–1.24)
GFR calc Af Amer: 7 mL/min — ABNORMAL LOW (ref >60)
GFR calc non Af Amer: 6 mL/min — ABNORMAL LOW (ref >60)
Glucose, Bld: 86 mg/dL (ref 70–99)
Potassium: 4.6 mmol/L (ref 3.5–5.1)
Sodium: 131 mmol/L — ABNORMAL LOW (ref 135–145)

## 2020-07-01 NOTE — Progress Notes (Signed)
Daniel Combs  MRN: [id number]  DOB/AGE: [birth year] 30 y.o.  Primary Care Physician:Pcp, No  Admit date: 06/16/2020  Chief Complaint:  Chief Complaint  Patient presents with   Chest Pain   Cough   Back Pain    S-Pt presented on  06/16/2020 with  Chief Complaint  Patient presents with   Chest Pain   Cough   Back Pain  . Patient offers no new complaints.  On direct questioning patient states"  I am doing fine"   Medications  amLODipine  10 mg Oral Daily   carvedilol  25 mg Oral BID WC   Chlorhexidine Gluconate Cloth  6 each Topical Q0600   Darunavir-Cobicisctat-Emtricitabine-Tenofovir Alafenamide  1 tablet Oral Q supper   epoetin (EPOGEN/PROCRIT) injection  10,000 Units Intravenous Q M,W,F-HD   feeding supplement (NEPRO CARB STEADY)  237 mL Oral BID BM   hydrALAZINE  50 mg Oral Q8H   multivitamin  1 tablet Oral QHS   sevelamer carbonate  1,600 mg Oral TID WC   sulfamethoxazole-trimethoprim  1 tablet Oral Daily         VPX:[id number] from the symptoms mentioned above,there are no other symptoms referable to all systems reviewed.  Physical Exam: Vital signs in last 24 hours: Temp:  [98.2 F (36.8 C)-98.6 F (37 C)] 98.2 F (36.8 C) (09/26 0420) Pulse Rate:  [90-93] 90 (09/26 0420) Resp:  [16-20] 16 (09/26 0420) BP: (103-117)/(62-72) 117/72 (09/26 0420) SpO2:  [91 %-94 %] 94 % (09/26 0420) Weight change:  Last BM Date: 06/30/20  Intake/Output from previous day: 09/25 0701 - 09/26 0700 In: 338.5 [P.O.:118; I.V.:20.5; IV Piggyback:200] Out: 10 [Chest Tube:10] No intake/output data recorded.   Physical Exam: General- pt is awake,alert, oriented to time place and person Resp- No acute REsp distress, decreased at bases, right pigtail catheter in situ CVS- S1S2 regular in rate and rhythm GIT- BS+, soft, NT, ND EXT- NO LE Edema, Cyanosis Access tunneled catheter in situ on the right side  Lab Results: CBC Recent Labs    06/29/20 0857  06/30/20 0544  WBC 10.2 7.6  HGB 8.0* 7.9*  HCT 23.9* 23.8*  PLT 457* 353    BMET Recent Labs    06/30/20 0544 07/01/20 0351  NA 131* 131*  K 4.8 4.6  CL 95* 92*  CO2 25 24  GLUCOSE 105* 86  BUN 35* 47*  CREATININE 8.04* 10.64*  CALCIUM 8.4* 8.6*    MICRO Recent Results (from the past 240 hour(s))  CULTURE, BLOOD (ROUTINE X 2) w Reflex to ID Panel     Status: None   Collection Time: 06/25/20  7:13 PM   Specimen: BLOOD  Result Value Ref Range Status   Specimen Description BLOOD LEFT ANTECUBITAL  Final   Special Requests   Final    BOTTLES DRAWN AEROBIC AND ANAEROBIC Blood Culture adequate volume   Culture   Final    NO GROWTH 5 DAYS Performed at [facility], [address] [place], [place] [address]    Report Status 06/30/2020 FINAL  Final  CULTURE, BLOOD (ROUTINE X 2) w Reflex to ID Panel     Status: None   Collection Time: 06/25/20  7:27 PM   Specimen: BLOOD  Result Value Ref Range Status   Specimen Description BLOOD BLOOD LEFT HAND  Final   Special Requests   Final    BOTTLES DRAWN AEROBIC AND ANAEROBIC Blood Culture adequate volume   Culture   Final    NO GROWTH 5 DAYS  Performed at [facility], [address] [place], [place] [address]    Report Status 06/30/2020 FINAL  Final  MRSA PCR Screening     Status: None   Collection Time: 06/26/20  8:50 PM   Specimen: Nasal Mucosa; Nasopharyngeal  Result Value Ref Range Status   MRSA by PCR NEGATIVE NEGATIVE Final    Comment:        The GeneXpert MRSA Assay (FDA approved for NASAL specimens only), is one component of a comprehensive MRSA colonization surveillance program. It is not intended to diagnose MRSA infection nor to guide or monitor treatment for MRSA infections. Performed at [facility], [address] [place], [place] [address]   Aerobic/Anaerobic Culture (surgical/deep wound)     Status: None (Preliminary result)   Collection Time: 06/27/20  4:47 PM    Specimen: Pleural Fluid  Result Value Ref Range Status   Specimen Description   Final    PLEURAL Performed at [facility], [address] [place], [place] [address]    Special Requests   Final    PLEURAL FLUID CHEST Performed at [facility], [place], [place] [address]    Gram Stain   Final    RARE WBC PRESENT,BOTH PMN AND MONONUCLEAR NO ORGANISMS SEEN    Culture   Final    NO GROWTH 3 DAYS NO ANAEROBES ISOLATED; CULTURE IN PROGRESS FOR 5 DAYS Performed at [facility], [address] [place],  [address]    Report Status PENDING  Incomplete      Lab Results  Component Value Date   PTH 654 (H) 06/17/2020   CALCIUM 8.6 (L) 07/01/2020   PHOS 6.2 (H) 06/27/2020               Impression:   Daniel Combs is a 29 y.o.  male with a PMHx oflongstanding hypertension, ESRD not yet on dialysis, anemia of chronic kidney disease, secondary hyperparathyroidism,HIV,who was admitted to [facility] on9/11/2021for evaluation of shortness of breath and chest pain.   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. for outpatient treatment  2)HTN Blood pressure is at goal  3)Anemia of chronic disease  HGb is not at goal (9--11) Patient is on Epogen protocol  4) secondary hyperparathyroidism -CKD Mineral-Bone Disorder   Secondary Hyperparathyroidism  present  Phosphorus is not at goal. Initiation of renal replacement therapy should help Patient is on binders  5) sepsis Patient had sepsis secondary to Streptococcus pneumonia bacteremia Patient also developed complex loculated right pleural effusion Patient underwent pigtail catheter for drainage of his effusion on September 22 Patient then underwent TPA instillation on  September 23 Patient is on broad-spectrum antibiotics Patient is being closely followed by hospitalist team/surgery team   6) electrolytes   sodium Hyponatremia Secondary to ESRD   potassium Normokalemic    7)Acid base Co2 at goal     Plan:  Will dialyze in am     Daniel Combs s [facility] 07/01/2020, 8:04 AM

## 2020-07-01 NOTE — Progress Notes (Signed)
PROGRESS NOTE    Daniel Combs  FGB:[id number] DOB: 09-04-1991 DOA: 06/16/2020 PCP: Pcp, No    Brief Narrative:  29 year old male with history of untreated HIV, hypertension, untreated chronic kidney disease progressing to ESRD who was initially admitted on 06/16/2020 with cough and back pain of 1 week duration.  Has not been adherent to antihypertensive or HIV treatment.  Blood cultures found to be positive for Streptococcus pneumonia.  Associated large right-sided empyema.  Infectious disease, nephrology, cardiothoracic surgery, interventional radiology on consult.  Initially met for sepsis secondary to community-acquired pneumonia.  Completed 10 days of IV Rocephin.  This is when the repeat chest CT showed loculated right pleural effusion.  Antibiotics expanded.  Nephrology consulted for ESRD.  Vascular surgery placed tunneled hemodialysis catheter 06/18/2020.  New start hemodialysis this admission.  Patient status post placement of an image guided right-sided pigtail catheter 06/27/2020.  Cardiothoracic surgery following for intrapleural TPA instillation.  Patient reported some back pain at the chest tube insertion site after first TPA instillation   Assessment & Plan:   Principal Problem:   Sepsis ([place]) Active Problems:   CAP (community acquired pneumonia)   Hypertension   ESRD (end stage renal disease) ([place])   Hyponatremia   Hyperkalemia   Elevated troponin   Anemia in ESRD (end-stage renal disease) ([place])   Bacteremia due to Streptococcus pneumoniae   Pleural effusion on right  Sepsis secondary to Streptococcus pneumonia bacteremia Complex loculated right pleural effusion secondary to above Plan: Cardiothoracic surgery following Recommend IR guided placement of right-sided pigtail catheter Status post right-sided pigtail catheter 06/27/2020 CTS following, status post pleural TPA instillation 06/28/2020 On broad-spectrum IV antibiotics, ID following Plan: Continue chest  tube Surgery following for chest tube management  AIDS Patient has been nonadherent to his HIV treatment started on Symtuza during this admission On Bactrim for P JP prophylaxis Infectious disease following  Accelerated hypertension Reasonable control on 4 medications  End-stage renal disease on hemodialysis New HD start this admission Nephrology consult following for HD needs Outpatient HD chair found [facility] Monday Wednesday Friday 6:30 AM   DVT prophylaxis: Subcu heparin Code Status: Full Family Communication: None today Disposition Plan: Status is: Inpatient  Remains inpatient appropriate because:Inpatient level of care appropriate due to severity of illness   Dispo: The patient is from: Home              Anticipated d/c is to: Home              Anticipated d/c date is: > 3 days              Patient currently is not medically stable to d/c.  Chest tube in place.  Surgery following her chest tube management.  Disposition plan pending   Consultants:   Nephrology  Infectious disease Cardiothoracic surgery Interventional radiology  Procedures:   Right-sided chest tube, 06/27/2020  Antimicrobials:   Vancomycin  Cefepime   Subjective: Seen and examined.  Complains of fatigue and pain  Objective: Vitals:   06/30/20 0540 06/30/20 1148 06/30/20 1948 07/01/20 0420  BP: 114/70 103/62 107/71 117/72  Pulse: 99 92 93 90  Resp: '20  20 16  ' Temp: 98.1 F (36.7 C) 98.6 F (37 C) 98.2 F (36.8 C) 98.2 F (36.8 C)  TempSrc: Oral Oral Oral Oral  SpO2: 93% 91% 91% 94%  Weight:      Height:        Intake/Output Summary (Last 24 hours) at 07/01/2020 1129 Last data  filed at 06/30/2020 2300 Gross per 24 hour  Intake 338.54 ml  Output 0 ml  Net 338.54 ml   Filed Weights   06/16/20 2228  Weight: 60.8 kg    Examination:  General exam: No acute distress Respiratory system: Decreased breath sounds on the right.  Air entry improving.  Right-sided  pigtail catheter.  Normal work of breathing.  Room air Cardiovascular system: S1 & S2 heard, RRR. No JVD, murmurs, rubs, gallops or clicks. No pedal edema. Gastrointestinal system: Abdomen is nondistended, soft and nontender. No organomegaly or masses felt. Normal bowel sounds heard. Central nervous system: Alert and oriented. No focal neurological deficits. Extremities: Symmetric 5 x 5 power. Skin: No rashes, lesions or ulcers Psychiatry: Judgement and insight appear normal. Mood & affect appropriate.     Data Reviewed: I have personally reviewed following labs and imaging studies  CBC: Recent Labs  Lab 06/26/20 1012 06/26/20 1012 06/27/20 0630 06/27/20 0630 06/27/20 1015 06/28/20 0409 06/29/20 0434 06/29/20 0857 06/30/20 0544  WBC 11.1*   < > 11.0*   < > 8.7 11.6* 10.0 10.2 7.6  NEUTROABS 8.4*  --  7.9*  --   --  8.6* 7.2  --  5.4  HGB 7.7*   < > 7.6*   < > 7.7* 7.8* 7.7* 8.0* 7.9*  HCT 23.4*   < > 23.1*   < > 23.5* 23.5* 23.6* 23.9* 23.8*  MCV 84.2   < > 84.9   < > 83.6 83.3 86.1 83.0 83.8  PLT 372   < > 420*   < > 407* 448* 440* 457* 353   < > = values in this interval not displayed.   Basic Metabolic Panel: Recent Labs  Lab 06/27/20 1015 06/28/20 0409 06/29/20 0434 06/30/20 0544 07/01/20 0351  NA 130* 133* 130* 131* 131*  K 4.4 5.4* 5.3* 4.8 4.6  CL 93* 95* 92* 95* 92*  CO2 '24 25 23 25 24  ' GLUCOSE 94 95 91 105* 86  BUN 46* 38* 52* 35* 47*  CREATININE 8.17* 7.85* 10.64* 8.04* 10.64*  CALCIUM 8.4* 8.3* 8.3* 8.4* 8.6*  PHOS 6.2*  --   --   --   --    GFR: Estimated Creatinine Clearance: 8.7 mL/min (A) (by C-G formula based on SCr of 10.64 mg/dL (H)). Liver Function Tests: Recent Labs  Lab 06/26/20 1012 06/27/20 1015  AST 32  --   ALT 29  --   ALKPHOS 70  --   BILITOT 0.6  --   PROT 7.1  --   ALBUMIN 2.1* 2.1*   No results for input(s): LIPASE, AMYLASE in the last 168 hours. No results for input(s): AMMONIA in the last 168 hours. Coagulation  Profile: No results for input(s): INR, PROTIME in the last 168 hours. Cardiac Enzymes: No results for input(s): CKTOTAL, CKMB, CKMBINDEX, TROPONINI in the last 168 hours. BNP (last 3 results) No results for input(s): PROBNP in the last 8760 hours. HbA1C: No results for input(s): HGBA1C in the last 72 hours. CBG: No results for input(s): GLUCAP in the last 168 hours. Lipid Profile: No results for input(s): CHOL, HDL, LDLCALC, TRIG, CHOLHDL, LDLDIRECT in the last 72 hours. Thyroid Function Tests: No results for input(s): TSH, T4TOTAL, FREET4, T3FREE, THYROIDAB in the last 72 hours. Anemia Panel: No results for input(s): VITAMINB12, FOLATE, FERRITIN, TIBC, IRON, RETICCTPCT in the last 72 hours. Sepsis Labs: Recent Labs  Lab 06/25/20 1911 06/25/20 1927 06/26/20 0518 06/27/20 0630  PROCALCITON 11.77  --  9.18 <0.10  LATICACIDVEN  --  0.8  --   --     Recent Results (from the past 240 hour(s))  CULTURE, BLOOD (ROUTINE X 2) w Reflex to ID Panel     Status: None   Collection Time: 06/25/20  7:13 PM   Specimen: BLOOD  Result Value Ref Range Status   Specimen Description BLOOD LEFT ANTECUBITAL  Final   Special Requests   Final    BOTTLES DRAWN AEROBIC AND ANAEROBIC Blood Culture adequate volume   Culture   Final    NO GROWTH 5 DAYS Performed at [facility], [address] [place], [place] [address]    Report Status 06/30/2020 FINAL  Final  CULTURE, BLOOD (ROUTINE X 2) w Reflex to ID Panel     Status: None   Collection Time: 06/25/20  7:27 PM   Specimen: BLOOD  Result Value Ref Range Status   Specimen Description BLOOD BLOOD LEFT HAND  Final   Special Requests   Final    BOTTLES DRAWN AEROBIC AND ANAEROBIC Blood Culture adequate volume   Culture   Final    NO GROWTH 5 DAYS Performed at [facility], [address] [place], [place] [address]    Report Status 06/30/2020 FINAL  Final  MRSA PCR Screening     Status: None   Collection Time: 06/26/20  8:50  PM   Specimen: Nasal Mucosa; Nasopharyngeal  Result Value Ref Range Status   MRSA by PCR NEGATIVE NEGATIVE Final    Comment:        The GeneXpert MRSA Assay (FDA approved for NASAL specimens only), is one component of a comprehensive MRSA colonization surveillance program. It is not intended to diagnose MRSA infection nor to guide or monitor treatment for MRSA infections. Performed at [facility], [address], [place], [place] [address]   Aerobic/Anaerobic Culture (surgical/deep wound)     Status: None (Preliminary result)   Collection Time: 06/27/20  4:47 PM   Specimen: Pleural Fluid  Result Value Ref Range Status   Specimen Description   Final    PLEURAL Performed at [facility], [address], [place], [place] [address]    Special Requests   Final    PLEURAL FLUID CHEST Performed at [facility], [place], [place] [address]    Gram Stain   Final    RARE WBC PRESENT,BOTH PMN AND MONONUCLEAR NO ORGANISMS SEEN    Culture   Final    NO GROWTH 3 DAYS NO ANAEROBES ISOLATED; CULTURE IN PROGRESS FOR 5 DAYS Performed at [facility], [address], [place], [place] [address]    Report Status PENDING  Incomplete         Radiology Studies: DG Chest Port 1 View  Result Date: 07/01/2020 CLINICAL DATA:  Pleural effusion follow-up. EXAM: PORTABLE CHEST 1 VIEW COMPARISON:  June 29, 2020 FINDINGS: Injectable port and right-sided drainage catheter in stable position. The cardiac silhouette is enlarged. Mediastinal contours appear intact. Mild improvement in loculated appearing right pleural effusion. Patchy airspace consolidation in the left upper lobe and right upper lobe not significantly changed. Osseous structures are without acute abnormality. Soft tissues are grossly normal. IMPRESSION: 1. Mild improvement in loculated appearing right pleural effusion. 2. Patchy airspace consolidation in the left upper lobe and right  upper lobe not significantly changed. Electronically Signed   By: Fidela Salisbury M.D.   On: 07/01/2020 10:40   DG Chest [facility]  Result Date: 06/29/2020 CLINICAL DATA:  Pleural effusion. EXAM: PORTABLE CHEST 1 VIEW COMPARISON:  06/27/2020.  CT 06/27/2020. FINDINGS: Dual-lumen catheter right chest tube in stable position. Mediastinum hilar structures normal. Stable cardiomegaly. Bilateral pulmonary infiltrates are again noted. Interim partial resolution of right pleural effusion from prior exam. No pneumothorax IMPRESSION: 1. Right chest tube in stable position. Interim partial resolution of right pleural effusion from prior exam. 2. Bilateral pulmonary infiltrates again noted without interim change. 3.  Stable cardiomegaly. Electronically Signed   By: Ada   On: 06/29/2020 14:17        Scheduled Meds: . amLODipine  10 mg Oral Daily  . carvedilol  25 mg Oral BID WC  . Chlorhexidine Gluconate Cloth  6 each Topical Q0600  . Darunavir-Cobicisctat-Emtricitabine-Tenofovir Alafenamide  1 tablet Oral Q supper  . epoetin (EPOGEN/PROCRIT) injection  10,000 Units Intravenous Q M,W,F-HD  . feeding supplement (NEPRO CARB STEADY)  237 mL Oral BID BM  . hydrALAZINE  50 mg Oral Q8H  . multivitamin  1 tablet Oral QHS  . sevelamer carbonate  1,600 mg Oral TID WC  . sulfamethoxazole-trimethoprim  1 tablet Oral Daily   Continuous Infusions: . sodium chloride Stopped (06/28/20 2330)  . cefTRIAXone (ROCEPHIN)  IV Stopped (06/30/20 2036)     LOS: 15 days    Time spent: 15 minutes    Sidney Ace, MD [facility] Pager [phone]  If 7PM-7AM, please contact night-coverage 07/01/2020, 11:29 AM

## 2020-07-01 NOTE — Progress Notes (Signed)
CC: Pleural effusion Subjective: Feeling better.  No new issues.  Only 10 cc from chest tube.  Chest x-ray personally reviewed showing some improvement.  No evidence of complications.  Drain in the left side.  Objective: Vital signs in last 24 hours: Temp:  [98.2 F (36.8 C)-98.4 F (36.9 C)] 98.4 F (36.9 C) (09/26 1146) Pulse Rate:  [88-93] 88 (09/26 1146) Resp:  [16-20] 18 (09/26 1146) BP: (107-120)/(71-81) 120/81 (09/26 1146) SpO2:  [91 %-97 %] 97 % (09/26 1146) Last BM Date: 06/30/20  Intake/Output from previous day: 09/25 0701 - 09/26 0700 In: 338.5 [P.O.:118; I.V.:20.5; IV Piggyback:200] Out: 10 [Chest Tube:10] Intake/Output this shift: No intake/output data recorded.  Physical exam: NAD alert Chest: decrease LEft side. CT in place w/o air leak. Abd: soft, nt   Lab Results: CBC  Recent Labs    06/29/20 0857 06/30/20 0544  WBC 10.2 7.6  HGB 8.0* 7.9*  HCT 23.9* 23.8*  PLT 457* 353   BMET Recent Labs    06/30/20 0544 07/01/20 0351  NA 131* 131*  K 4.8 4.6  CL 95* 92*  CO2 25 24  GLUCOSE 105* 86  BUN 35* 47*  CREATININE 8.04* 10.64*  CALCIUM 8.4* 8.6*   PT/INR No results for input(s): LABPROT, INR in the last 72 hours. ABG No results for input(s): PHART, HCO3 in the last 72 hours.  Invalid input(s): PCO2, PO2  Studies/Results: DG Chest Port 1 View  Result Date: 07/01/2020 CLINICAL DATA:  Pleural effusion follow-up. EXAM: PORTABLE CHEST 1 VIEW COMPARISON:  June 29, 2020 FINDINGS: Injectable port and right-sided drainage catheter in stable position. The cardiac silhouette is enlarged. Mediastinal contours appear intact. Mild improvement in loculated appearing right pleural effusion. Patchy airspace consolidation in the left upper lobe and right upper lobe not significantly changed. Osseous structures are without acute abnormality. Soft tissues are grossly normal. IMPRESSION: 1. Mild improvement in loculated appearing right pleural effusion. 2.  Patchy airspace consolidation in the left upper lobe and right upper lobe not significantly changed. Electronically Signed   By: Fidela Salisbury M.D.   On: 07/01/2020 10:40    Anti-infectives: Anti-infectives (From admission, onward)   Start     Dose/Rate Route Frequency Ordered Stop   06/29/20 2000  cefTRIAXone (ROCEPHIN) 2 g in sodium chloride 0.9 % 100 mL IVPB        2 g 200 mL/hr over 30 Minutes Intravenous Every 24 hours 06/29/20 1036     06/27/20 1200  vancomycin (VANCOCIN) IVPB 500 mg/100 ml premix  Status:  Discontinued        500 mg 100 mL/hr over 60 Minutes Intravenous Every M-W-F (Hemodialysis) 06/25/20 1924 06/27/20 1140   06/27/20 1200  vancomycin (VANCOCIN) IVPB 750 mg/150 ml premix  Status:  Discontinued        750 mg 150 mL/hr over 60 Minutes Intravenous Every M-W-F (Hemodialysis) 06/27/20 1140 06/29/20 1035   06/25/20 2000  vancomycin (VANCOREADY) IVPB 1250 mg/250 mL        1,250 mg 166.7 mL/hr over 90 Minutes Intravenous  Once 06/25/20 1924 06/25/20 2256   06/25/20 2000  ceFEPIme (MAXIPIME) 1 g in sodium chloride 0.9 % 100 mL IVPB  Status:  Discontinued        1 g 200 mL/hr over 30 Minutes Intravenous Every 24 hours 06/25/20 1924 06/29/20 1035   06/25/20 1700  Darunavir-Cobicisctat-Emtricitabine-Tenofovir Alafenamide (SYMTUZA) 800-150-200-10 MG TABS 1 tablet        1 tablet Oral Daily with supper 06/25/20 1320  06/19/20 2000  sulfamethoxazole-trimethoprim (BACTRIM) 400-80 MG per tablet 1 tablet        1 tablet Oral Daily 06/19/20 1729     06/16/20 1130  cefTRIAXone (ROCEPHIN) 2 g in sodium chloride 0.9 % 100 mL IVPB  Status:  Discontinued        2 g 200 mL/hr over 30 Minutes Intravenous Every 24 hours 06/16/20 1123 06/25/20 1924   06/16/20 1130  azithromycin (ZITHROMAX) 500 mg in sodium chloride 0.9 % 250 mL IVPB  Status:  Discontinued        500 mg 250 mL/hr over 60 Minutes Intravenous Every 24 hours 06/16/20 1123 06/18/20 2016       Assessment/Plan:  Proximal pleural effusion status post TPA. chest tube to suction today.  Dr. Genevive Bi to see him tomorrow make reconsider another TPA dose. Patient is not motivated and does not want to cooperate with pulmonary toilet and incentive spirometer.  Is obviously limited his recovery.  No need for surgical intervention at this time. I spent about 25 minutes in this encounter with greater than 50% spent in coordination and counseling of his care   Caroleen Hamman, MD, [facility]  07/01/2020

## 2020-07-02 DIAGNOSIS — J13 Pneumonia due to Streptococcus pneumoniae: Secondary | ICD-10-CM

## 2020-07-02 LAB — BASIC METABOLIC PANEL
Anion gap: 14 (ref 5–15)
BUN: 60 mg/dL — ABNORMAL HIGH (ref 6–20)
CO2: 25 mmol/L (ref 22–32)
Calcium: 8.7 mg/dL — ABNORMAL LOW (ref 8.9–10.3)
Chloride: 93 mmol/L — ABNORMAL LOW (ref 98–111)
Creatinine, Ser: 12.99 mg/dL — ABNORMAL HIGH (ref 0.61–1.24)
GFR calc Af Amer: 5 mL/min — ABNORMAL LOW (ref >60)
GFR calc non Af Amer: 5 mL/min — ABNORMAL LOW (ref >60)
Glucose, Bld: 85 mg/dL (ref 70–99)
Potassium: 5.2 mmol/L — ABNORMAL HIGH (ref 3.5–5.1)
Sodium: 132 mmol/L — ABNORMAL LOW (ref 135–145)

## 2020-07-02 LAB — AEROBIC/ANAEROBIC CULTURE W GRAM STAIN (SURGICAL/DEEP WOUND): Culture: NO GROWTH

## 2020-07-02 MED ORDER — MORPHINE SULFATE (PF) 2 MG/ML IV SOLN
2.0000 mg | Freq: Once | INTRAVENOUS | Status: AC
  Administered 2020-07-03: 2 mg via INTRAVENOUS
  Filled 2020-07-02: qty 1

## 2020-07-02 MED ORDER — SODIUM CHLORIDE (PF) 0.9 % IJ SOLN
Freq: Once | INTRAMUSCULAR | Status: AC
  Filled 2020-07-02: qty 10

## 2020-07-02 MED ORDER — MORPHINE SULFATE (PF) 2 MG/ML IV SOLN
2.0000 mg | Freq: Once | INTRAVENOUS | Status: AC
  Administered 2020-07-02: 2 mg via INTRAVENOUS
  Filled 2020-07-02: qty 1

## 2020-07-02 NOTE — Progress Notes (Signed)
[facility], [place] 07/02/20  Subjective:   LOS: 16    HEMODIALYSIS FLOWSHEET:  Blood Flow Rate (mL/min): 250 mL/min Arterial Pressure (mmHg): -170 mmHg Venous Pressure (mmHg): 130 mmHg Transmembrane Pressure (mmHg): 50 mmHg Ultrafiltration Rate (mL/min): 860 mL/min Dialysate Flow Rate (mL/min): 300 ml/min Conductivity: Machine : 13.9 Conductivity: Machine : 13.9 Dialysis Fluid Bolus: Normal Saline Bolus Amount (mL): 250 mL  Denies any acute complaints. Seen during dialysis.  Tolerating well  Objective:  Vital signs in last 24 hours:  Temp:  [98 F (36.7 C)-99 F (37.2 C)] 98.6 F (37 C) (09/27 0930) Pulse Rate:  [87-91] 87 (09/27 0607) Resp:  [18-20] 20 (09/27 0607) BP: (111-120)/(73-81) 116/75 (09/27 0607) SpO2:  [96 %-97 %] 97 % (09/27 0607)  Weight change:  Filed Weights   06/16/20 2228  Weight: 60.8 kg    Intake/Output:   No intake or output data in the 24 hours ending 07/02/20 1007  Physical Exam: General:  No acute distress, laying in the bed  HEENT  anicteric, moist oral mucous membrane  Pulm/lungs  normal breathing effort, lungs are clear to auscultation  CVS/Heart  regular rhythm, no rub or gallop  Abdomen:   Soft, nontender  Extremities:  No peripheral edema  Neurologic:  Alert, oriented, able to follow commands  Skin:  No acute rashes  Right IJ PermCath Right chest tube in place    Basic Metabolic Panel:  Recent Labs  Lab 06/27/20 1015 06/27/20 1015 06/28/20 0409 06/28/20 0409 06/29/20 0434 06/29/20 0434 06/30/20 0544 07/01/20 0351 07/02/20 0455  NA 130*   < > 133*  --  130*  --  131* 131* 132*  K 4.4   < > 5.4*  --  5.3*  --  4.8 4.6 5.2*  CL 93*   < > 95*  --  92*  --  95* 92* 93*  CO2 24   < > 25  --  23  --  25 24 25   GLUCOSE 94   < > 95  --  91  --  105* 86 85  BUN 46*   < > 38*  --  52*  --  35* 47* 60*  CREATININE 8.17*   < > 7.85*  --  10.64*  --  8.04* 10.64* 12.99*  CALCIUM 8.4*   < >  8.3*   < > 8.3*   < > 8.4* 8.6* 8.7*  PHOS 6.2*  --   --   --   --   --   --   --   --    < > = values in this interval not displayed.     CBC: Recent Labs  Lab 06/26/20 1012 06/26/20 1012 06/27/20 0630 06/27/20 0630 06/27/20 1015 06/28/20 0409 06/29/20 0434 06/29/20 0857 06/30/20 0544  WBC 11.1*   < > 11.0*   < > 8.7 11.6* 10.0 10.2 7.6  NEUTROABS 8.4*  --  7.9*  --   --  8.6* 7.2  --  5.4  HGB 7.7*   < > 7.6*   < > 7.7* 7.8* 7.7* 8.0* 7.9*  HCT 23.4*   < > 23.1*   < > 23.5* 23.5* 23.6* 23.9* 23.8*  MCV 84.2   < > 84.9   < > 83.6 83.3 86.1 83.0 83.8  PLT 372   < > 420*   < > 407* 448* 440* 457* 353   < > = values in this interval not displayed.  Lab Results  Component Value Date   HEPBSAG NON REACTIVE 06/17/2020   HEPBIGM NON REACTIVE 06/17/2020      Microbiology:  Recent Results (from the past 240 hour(s))  CULTURE, BLOOD (ROUTINE X 2) w Reflex to ID Panel     Status: None   Collection Time: 06/25/20  7:13 PM   Specimen: BLOOD  Result Value Ref Range Status   Specimen Description BLOOD LEFT ANTECUBITAL  Final   Special Requests   Final    BOTTLES DRAWN AEROBIC AND ANAEROBIC Blood Culture adequate volume   Culture   Final    NO GROWTH 5 DAYS Performed at [facility], [address] [place], [place] [address]    Report Status 06/30/2020 FINAL  Final  CULTURE, BLOOD (ROUTINE X 2) w Reflex to ID Panel     Status: None   Collection Time: 06/25/20  7:27 PM   Specimen: BLOOD  Result Value Ref Range Status   Specimen Description BLOOD BLOOD LEFT HAND  Final   Special Requests   Final    BOTTLES DRAWN AEROBIC AND ANAEROBIC Blood Culture adequate volume   Culture   Final    NO GROWTH 5 DAYS Performed at [facility], [address] [place], [place] [address]    Report Status 06/30/2020 FINAL  Final  MRSA PCR Screening     Status: None   Collection Time: 06/26/20  8:50 PM   Specimen: Nasal Mucosa; Nasopharyngeal  Result Value Ref  Range Status   MRSA by PCR NEGATIVE NEGATIVE Final    Comment:        The GeneXpert MRSA Assay (FDA approved for NASAL specimens only), is one component of a comprehensive MRSA colonization surveillance program. It is not intended to diagnose MRSA infection nor to guide or monitor treatment for MRSA infections. Performed at [facility], [address], [place], [place] [address]   Aerobic/Anaerobic Culture (surgical/deep wound)     Status: None (Preliminary result)   Collection Time: 06/27/20  4:47 PM   Specimen: Pleural Fluid  Result Value Ref Range Status   Specimen Description   Final    PLEURAL Performed at [facility], [address] [place], [place] [address]    Special Requests   Final    PLEURAL FLUID CHEST Performed at [facility], [place], [place] [address]    Gram Stain   Final    RARE WBC PRESENT,BOTH PMN AND MONONUCLEAR NO ORGANISMS SEEN    Culture   Final    NO GROWTH 4 DAYS NO ANAEROBES ISOLATED; CULTURE IN PROGRESS FOR 5 DAYS Performed at [facility] [address], [place],  [address]    Report Status PENDING  Incomplete    Coagulation Studies: No results for input(s): LABPROT, INR in the last 72 hours.  Urinalysis: No results for input(s): COLORURINE, LABSPEC, PHURINE, GLUCOSEU, HGBUR, BILIRUBINUR, KETONESUR, PROTEINUR, UROBILINOGEN, NITRITE, LEUKOCYTESUR in the last 72 hours.  Invalid input(s): APPERANCEUR    Imaging: DG Chest Port 1 View  Result Date: 07/01/2020 CLINICAL DATA:  Pleural effusion follow-up. EXAM: PORTABLE CHEST 1 VIEW COMPARISON:  June 29, 2020 FINDINGS: Injectable port and right-sided drainage catheter in stable position. The cardiac silhouette is enlarged. Mediastinal contours appear intact. Mild improvement in loculated appearing right pleural effusion. Patchy airspace consolidation in the left upper lobe and right upper lobe not significantly changed. Osseous  structures are without acute abnormality. Soft tissues are grossly normal. IMPRESSION: 1. Mild  improvement in loculated appearing right pleural effusion. 2. Patchy airspace consolidation in the left upper lobe and right upper lobe not significantly changed. Electronically Signed   By: Fidela Salisbury M.D.   On: 07/01/2020 10:40     Medications:   . sodium chloride 250 mL (07/01/20 2118)  . cefTRIAXone (ROCEPHIN)  IV 2 g (07/01/20 2122)   . alteplase (tPA) 10mg  in NS 77mL for Dr.Oaks (intrapleural administration/[facility])   Intrapleural Once  . amLODipine  10 mg Oral Daily  . carvedilol  25 mg Oral BID WC  . Chlorhexidine Gluconate Cloth  6 each Topical Q0600  . Darunavir-Cobicisctat-Emtricitabine-Tenofovir Alafenamide  1 tablet Oral Q supper  . epoetin (EPOGEN/PROCRIT) injection  10,000 Units Intravenous Q M,W,F-HD  . feeding supplement (NEPRO CARB STEADY)  237 mL Oral BID BM  . hydrALAZINE  50 mg Oral Q8H  . multivitamin  1 tablet Oral QHS  . sevelamer carbonate  1,600 mg Oral TID WC  . sulfamethoxazole-trimethoprim  1 tablet Oral Daily   sodium chloride, acetaminophen, chlorpheniramine-HYDROcodone, guaiFENesin, hydrALAZINE, labetalol, ondansetron (ZOFRAN) IV, oxyCODONE-acetaminophen, promethazine  Assessment/ Plan:  29 y.o. male with longstanding hypertension, chronic kidney disease, anemia of chronic kidney disease, secondary hyperparathyroidism,HIV  was admitted on 06/16/2020 for  Principal Problem:   Sepsis ([place]) Active Problems:   CAP (community acquired pneumonia)   Hypertension   ESRD (end stage renal disease) ([place])   Hyponatremia   Hyperkalemia   Elevated troponin   Anemia in ESRD (end-stage renal disease) ([place])   Bacteremia due to Streptococcus pneumoniae   Pleural effusion on right  Hyperkalemia [E87.5] Acute renal insufficiency [N28.9] CAP (community acquired pneumonia) [J18.9] Hypertension, unspecified type [I10] Community acquired pneumonia, unspecified  laterality [J18.9]  #. ESRD Unclear underlying cause but most likely longstanding hypertension Other differential includes HIV-associated nephropathy as patient had untreated HIV Outpatient discharge planning for [facility] is in progress  #. Anemia of CKD  Lab Results  Component Value Date   HGB 7.9 (L) 06/30/2020   Low dose EPO with HD  #. Secondary hyperparathyroidism of renal origin N 25.81      Component Value Date/Time   PTH 654 (H) 06/17/2020 2250   Lab Results  Component Value Date   PHOS 6.2 (H) 06/27/2020   Monitor calcium and phos level during this admission   #.  Sepsis Secondary to streptococcal pneumonia bacteremia, complex loculated right pleural effusion Underwent TPA instillation on September 23 Followed by general surgery and thoracic surgery    LOS: [place] 9/27/202110:07 AM  [place], [place]

## 2020-07-02 NOTE — Progress Notes (Signed)
PROGRESS NOTE    Daniel Combs  TAV:[id number] DOB: [birth year] DOA: 06/16/2020 PCP: Pcp, No    Brief Narrative:  29 year old male with history of untreated HIV, hypertension, untreated chronic kidney disease progressing to ESRD who was initially admitted on 06/16/2020 with cough and back pain of 1 week duration.  Has not been adherent to antihypertensive or HIV treatment.  Blood cultures found to be positive for Streptococcus pneumonia.  Associated large right-sided empyema.  Infectious disease, nephrology, cardiothoracic surgery, interventional radiology on consult.  Initially met for sepsis secondary to community-acquired pneumonia.  Completed 10 days of IV Rocephin.  This is when the repeat chest CT showed loculated right pleural effusion.  Antibiotics expanded.  Nephrology consulted for ESRD.  Vascular surgery placed tunneled hemodialysis catheter 06/18/2020.  New start hemodialysis this admission.  Patient status post placement of an image guided right-sided pigtail catheter 06/27/2020.  Cardiothoracic surgery following for intrapleural TPA instillation.  Patient reported some back pain at the chest tube insertion site after first TPA instillation.  Reports his breathing is actually little better   Assessment & Plan:   Principal Problem:   Sepsis ([place]) Active Problems:   CAP (community acquired pneumonia)   Hypertension   ESRD (end stage renal disease) ([place])   Hyponatremia   Hyperkalemia   Elevated troponin   Anemia in ESRD (end-stage renal disease) ([place])   Bacteremia due to Streptococcus pneumoniae   Pleural effusion on right  Sepsis secondary to Streptococcus pneumonia bacteremia Complex loculated right pleural effusion secondary to above Plan: Cardiothoracic surgery following Recommend IR guided placement of right-sided pigtail catheter Status post right-sided pigtail catheter 06/27/2020 CTS following, status post pleural TPA instillation 06/28/2020 On broad-spectrum IV  antibiotics, ID following Plan: Continue chest tube Surgery following for chest tube management Plan for TPA installation 9/27.    AIDS Patient has been nonadherent to his HIV treatment started on Symtuza during this admission On Bactrim for P JP prophylaxis Infectious disease following  Accelerated hypertension Reasonable control on 4 medications  End-stage renal disease on hemodialysis New HD start this admission Nephrology consult following for HD needs Outpatient HD chair found [place] Monday Wednesday Friday 6:30 AM   DVT prophylaxis: Subcu heparin Code Status: Full Family Communication: None today Disposition Plan: Status is: Inpatient  Remains inpatient appropriate because:Inpatient level of care appropriate due to severity of illness   Dispo: The patient is from: Home              Anticipated d/c is to: Home              Anticipated d/c date is: > 3 days              Patient currently is not medically stable to d/c.  Chest tube in place.  Surgery following her chest tube management.  Disposition plan pending   Consultants:   Nephrology  Infectious disease Cardiothoracic surgery Interventional radiology  Procedures:   Right-sided chest tube, 06/27/2020  Antimicrobials:   Vancomycin  Cefepime   Subjective: Seen and examined.  Reports improvement in breathing  Objective: Vitals:   07/02/20 1100 07/02/20 1115 07/02/20 1130 07/02/20 1145  BP: 114/77 112/73 105/75 107/68  Pulse: 98 97 98 95  Resp: _0 Temp:      TempSrc:      SpO2: 95% 95% 95% 96%  Weight:      Height:       No intake or output data in the 24 hours  ending 07/02/20 1237 Filed Weights   06/16/20 2228  Weight: 60.8 kg    Examination:  General exam: No acute distress Respiratory system: Decreased breath sounds on the right.  Air entry improving.  Right-sided pigtail catheter.  Normal work of breathing.  Room air Cardiovascular system: S1 & S2 heard,  RRR. No JVD, murmurs, rubs, gallops or clicks. No pedal edema. Gastrointestinal system: Abdomen is nondistended, soft and nontender. No organomegaly or masses felt. Normal bowel sounds heard. Central nervous system: Alert and oriented. No focal neurological deficits. Extremities: Symmetric 5 x 5 power. Skin: No rashes, lesions or ulcers Psychiatry: Judgement and insight appear normal. Mood & affect appropriate.     Data Reviewed: I have personally reviewed following labs and imaging studies  CBC: Recent Labs  Lab 06/26/20 1012 06/26/20 1012 06/27/20 0630 06/27/20 0630 06/27/20 1015 06/28/20 0409 06/29/20 0434 06/29/20 0857 06/30/20 0544  WBC 11.1*   < > 11.0*   < > 8.7 11.6* 10.0 10.2 7.6  NEUTROABS 8.4*  --  7.9*  --   --  8.6* 7.2  --  5.4  HGB 7.7*   < > 7.6*   < > 7.7* 7.8* 7.7* 8.0* 7.9*  HCT 23.4*   < > 23.1*   < > 23.5* 23.5* 23.6* 23.9* 23.8*  MCV 84.2   < > 84.9   < > 83.6 83.3 86.1 83.0 83.8  PLT 372   < > 420*   < > 407* 448* 440* 457* 353   < > = values in this interval not displayed.   Basic Metabolic Panel: Recent Labs  Lab 06/27/20 1015 06/27/20 1015 06/28/20 0409 06/29/20 0434 06/30/20 0544 07/01/20 0351 07/02/20 0455  NA 130*   < > 133* 130* 131* 131* 132*  K 4.4   < > 5.4* 5.3* 4.8 4.6 5.2*  CL 93*   < > 95* 92* 95* 92* 93*  CO2 24   < > _0 GLUCOSE 94   < > 95 91 105* 86 85  BUN 46*   < > 38* 52* 35* 47* 60*  CREATININE 8.17*   < > 7.85* 10.64* 8.04* 10.64* 12.99*  CALCIUM 8.4*   < > 8.3* 8.3* 8.4* 8.6* 8.7*  PHOS 6.2*  --   --   --   --   --   --    < > = values in this interval not displayed.   GFR: Estimated Creatinine Clearance: 7.2 mL/min (A) (by C-G formula based on SCr of 12.99 mg/dL (H)). Liver Function Tests: Recent Labs  Lab 06/26/20 1012 06/27/20 1015  AST 32  --   ALT 29  --   ALKPHOS 70  --   BILITOT 0.6  --   PROT 7.1  --   ALBUMIN 2.1* 2.1*   No results for input(s): LIPASE, AMYLASE in the last 168  hours. No results for input(s): AMMONIA in the last 168 hours. Coagulation Profile: No results for input(s): INR, PROTIME in the last 168 hours. Cardiac Enzymes: No results for input(s): CKTOTAL, CKMB, CKMBINDEX, TROPONINI in the last 168 hours. BNP (last 3 results) No results for input(s): PROBNP in the last 8760 hours. HbA1C: No results for input(s): HGBA1C in the last 72 hours. CBG: No results for input(s): GLUCAP in the last 168 hours. Lipid Profile: No results for input(s): CHOL, HDL, LDLCALC, TRIG, CHOLHDL, LDLDIRECT in the last 72 hours. Thyroid Function Tests: No results for input(s): TSH, T4TOTAL, FREET4, T3FREE, THYROIDAB  in the last 72 hours. Anemia Panel: No results for input(s): VITAMINB12, FOLATE, FERRITIN, TIBC, IRON, RETICCTPCT in the last 72 hours. Sepsis Labs: Recent Labs  Lab 06/25/20 1911 06/25/20 1927 06/26/20 0518 06/27/20 0630  PROCALCITON 11.77  --  9.18 <0.10  LATICACIDVEN  --  0.8  --   --     Recent Results (from the past 240 hour(s))  CULTURE, BLOOD (ROUTINE X 2) w Reflex to ID Panel     Status: None   Collection Time: 06/25/20  7:13 PM   Specimen: BLOOD  Result Value Ref Range Status   Specimen Description BLOOD LEFT ANTECUBITAL  Final   Special Requests   Final    BOTTLES DRAWN AEROBIC AND ANAEROBIC Blood Culture adequate volume   Culture   Final    NO GROWTH 5 DAYS Performed at [facility], [address] [place], [place] [address]    Report Status 06/30/2020 FINAL  Final  CULTURE, BLOOD (ROUTINE X 2) w Reflex to ID Panel     Status: None   Collection Time: 06/25/20  7:27 PM   Specimen: BLOOD  Result Value Ref Range Status   Specimen Description BLOOD BLOOD LEFT HAND  Final   Special Requests   Final    BOTTLES DRAWN AEROBIC AND ANAEROBIC Blood Culture adequate volume   Culture   Final    NO GROWTH 5 DAYS Performed at [facility], [address] [place], [place] [address]    Report Status 06/30/2020 FINAL   Final  MRSA PCR Screening     Status: None   Collection Time: 06/26/20  8:50 PM   Specimen: Nasal Mucosa; Nasopharyngeal  Result Value Ref Range Status   MRSA by PCR NEGATIVE NEGATIVE Final    Comment:        The GeneXpert MRSA Assay (FDA approved for NASAL specimens only), is one component of a comprehensive MRSA colonization surveillance program. It is not intended to diagnose MRSA infection nor to guide or monitor treatment for MRSA infections. Performed at [facility], [address] [place], [place] [address]   Aerobic/Anaerobic Culture (surgical/deep wound)     Status: None   Collection Time: 06/27/20  4:47 PM   Specimen: Pleural Fluid  Result Value Ref Range Status   Specimen Description   Final    PLEURAL Performed at [facility], [address] [place], [place] [address]    Special Requests   Final    PLEURAL FLUID CHEST Performed at [facility], [address] [place], [place] [address]    Gram Stain   Final    RARE WBC PRESENT,BOTH PMN AND MONONUCLEAR NO ORGANISMS SEEN    Culture   Final    No growth aerobically or anaerobically. Performed at [facility], [place] [address], [place], [place] [address]    Report Status 07/02/2020 FINAL  Final         Radiology Studies: DG Chest Port 1 View  Result Date: 07/01/2020 CLINICAL DATA:  Pleural effusion follow-up. EXAM: PORTABLE CHEST 1 VIEW COMPARISON:  June 29, 2020 FINDINGS: Injectable port and right-sided drainage catheter in stable position. The cardiac silhouette is enlarged. Mediastinal contours appear intact. Mild improvement in loculated appearing right pleural effusion. Patchy airspace consolidation in the left upper lobe and right upper lobe not significantly changed. Osseous structures are without acute abnormality. Soft tissues are grossly normal. IMPRESSION: 1. Mild improvement in loculated appearing right pleural effusion. 2. Patchy airspace consolidation in  the left upper lobe and right upper lobe not significantly changed. Electronically Signed   By: Fidela Salisbury M.D.   On: 07/01/2020 10:40        Scheduled Meds: . alteplase (tPA) 22m in NS 463mfor Dr.Oaks (intrapleural administration/[facility])   Intrapleural Once  . amLODipine  10 mg Oral Daily  . carvedilol  25 mg Oral BID WC  . Chlorhexidine Gluconate Cloth  6 each Topical Q0600  . Darunavir-Cobicisctat-Emtricitabine-Tenofovir Alafenamide  1 tablet Oral Q supper  . epoetin (EPOGEN/PROCRIT) injection  10,000 Units Intravenous Q M,W,F-HD  . feeding supplement (NEPRO CARB STEADY)  237 mL Oral BID BM  . hydrALAZINE  50 mg Oral Q8H  . multivitamin  1 tablet Oral QHS  . sevelamer carbonate  1,600 mg Oral TID WC  . sulfamethoxazole-trimethoprim  1 tablet Oral Daily   Continuous Infusions: . sodium chloride 250 mL (07/01/20 2118)  . cefTRIAXone (ROCEPHIN)  IV 2 g (07/01/20 2122)     LOS: 16 days    Time spent: 15 minutes    SuSidney AceMD [facility] Pager [phone]  If 7PM-7AM, please contact night-coverage 07/02/2020, 12:37 PM

## 2020-07-02 NOTE — Progress Notes (Signed)
HD treatment completed 1.5 L removed, remained alert and oriented no s/s of distress to note

## 2020-07-02 NOTE — Progress Notes (Signed)
Progress Note Patient's chest tube was disconnected from suction and luer lock was cleaned with alcohol wipe x2. 10 mg of tPA in 50 ml of NS was instilled via the chest tube. Chest tube was reconnected and clamped. Patient tolerated well.   Plan -- Leave chest tube clamped x 4 hours -- At 1900, reconnect to suction and monitor output -- Morning CXR  -- Edison Simon, PA-C [facility] 07/02/2020, 3:03 PM [phone] M-F: 7am - 4pm

## 2020-07-02 NOTE — Progress Notes (Signed)
Referring Physician(s): Dr. Jefm Miles  Supervising Physician: Aletta Edouard  Patient Status:  [facility] - In-pt  Chief Complaint: Parapneumonic effusion secondary to pneumonia. IR placed a right sided chest tube on 9.23.21 performed by Dr. Keturah Barre. Serafina Royals   Subjective: Patient in dialysis session. Alert and laying in bed, calm and comfortable. Patient reports an improvement in breathing. No new complaints.   Allergies: Patient has no known allergies.  Medications: Prior to Admission medications   Medication Sig Start Date End Date Taking? Authorizing Provider  losartan (COZAAR) 100 MG tablet Take 100 mg by mouth daily. 05/10/20  Yes [provider]     Vital Signs: BP 116/75 (BP Location: Left Arm)   Pulse 87   Temp 98 F (36.7 C) (Oral)   Resp 20   Ht 5\' 6"  (1.676 m)   Wt 134 lb (60.8 kg)   SpO2 97%   BMI 21.63 kg/m   Physical Exam Vitals and nursing note reviewed.  Constitutional:      Appearance: He is well-developed.  HENT:     Head: Normocephalic.  Pulmonary:     Effort: Pulmonary effort is normal.     Comments: Right sided chest tube present. No air leak noted. 780 ml of output noted in the atrium.  Musculoskeletal:        General: Normal range of motion.     Cervical back: Normal range of motion.  Skin:    General: Skin is dry.  Neurological:     Mental Status: He is alert and oriented to person, place, and time.     Imaging: DG Chest Port 1 View  Result Date: 07/01/2020 CLINICAL DATA:  Pleural effusion follow-up. EXAM: PORTABLE CHEST 1 VIEW COMPARISON:  June 29, 2020 FINDINGS: Injectable port and right-sided drainage catheter in stable position. The cardiac silhouette is enlarged. Mediastinal contours appear intact. Mild improvement in loculated appearing right pleural effusion. Patchy airspace consolidation in the left upper lobe and right upper lobe not significantly changed. Osseous structures are without acute abnormality. Soft tissues are  grossly normal. IMPRESSION: 1. Mild improvement in loculated appearing right pleural effusion. 2. Patchy airspace consolidation in the left upper lobe and right upper lobe not significantly changed. Electronically Signed   By: Fidela Salisbury M.D.   On: 07/01/2020 10:40   DG Chest Port 1 View  Result Date: 06/29/2020 CLINICAL DATA:  Pleural effusion. EXAM: PORTABLE CHEST 1 VIEW COMPARISON:  06/27/2020.  CT 06/27/2020. FINDINGS: Dual-lumen catheter right chest tube in stable position. Mediastinum hilar structures normal. Stable cardiomegaly. Bilateral pulmonary infiltrates are again noted. Interim partial resolution of right pleural effusion from prior exam. No pneumothorax IMPRESSION: 1. Right chest tube in stable position. Interim partial resolution of right pleural effusion from prior exam. 2. Bilateral pulmonary infiltrates again noted without interim change. 3.  Stable cardiomegaly. Electronically Signed   By: Marcello Moores  Register   On: 06/29/2020 14:17    Labs:  CBC: Recent Labs    06/28/20 0409 06/29/20 0434 06/29/20 0857 06/30/20 0544  WBC 11.6* 10.0 10.2 7.6  HGB 7.8* 7.7* 8.0* 7.9*  HCT 23.5* 23.6* 23.9* 23.8*  PLT 448* 440* 457* 353    COAGS: Recent Labs    06/23/20 0032  INR 1.1  APTT 41*    BMP: Recent Labs    06/29/20 0434 06/30/20 0544 07/01/20 0351 07/02/20 0455  NA 130* 131* 131* 132*  K 5.3* 4.8 4.6 5.2*  CL 92* 95* 92* 93*  CO2 23 25 24  25  GLUCOSE 91 105* 86 85  BUN 52* 35* 47* 60*  CALCIUM 8.3* 8.4* 8.6* 8.7*  CREATININE 10.64* 8.04* 10.64* 12.99*  GFRNONAA 6* 8* 6* 5*  GFRAA 7* 9* 7* 5*    LIVER FUNCTION TESTS: Recent Labs    06/16/20 0832 06/23/20 0911 06/26/20 1012 06/27/20 1015  BILITOT 0.7  --  0.6  --   AST 15  --  32  --   ALT 30  --  29  --   ALKPHOS 63  --  70  --   PROT 8.5*  --  7.1  --   ALBUMIN 3.2* 2.2* 2.1* 2.1*    Assessment and Plan:  29 y.o. male inpatient. Smoker history of HTN, HIV. Presented to the ED at [facility]  with shob, chest pain and right sided back pain found to be in ESRD with a right sided parapneumonic effusion secondary to pneumonia. IR placed a right sided chest tube on 9.23.21. Chest xray from 9.26.21 reads Mild improvement in loculated appearing right pleural effusion  Patient is being followed by CT surgery. Per noted from Dr. Jefm Miles dated 9.27.27 plan is for intrapleural thrombolytics.  780 ml of output noted in the atrium device. 10 ml noted in Epic in the last 24 ours. No air leak noted.   Cultures from pleural fluid show no growth in 4 days. BUN 60 , Cr 12.99, Potassium 5.2.  IR will continue to follow along - plans per CT surgery.  Electronically Signed: Jacqualine Mau, NP 07/02/2020, 9:39 AM   I spent a total of 15 Minutes at the patient's bedside AND on the patient's hospital floor or unit, greater than 50% of which was counseling/coordinating care for right sided chest tube placement.

## 2020-07-02 NOTE — Progress Notes (Signed)
Daniel Combs Follow Up Note  Patient ID: Daniel Combs, male   DOB: 1991/01/22, 29 y.o.   MRN: [id number]  HISTORY: No new problems today.  He states that he actually feels a little bit better.  He is less short of breath.    Vitals:   07/01/20 2030 07/02/20 0607  BP: 111/73 116/75  Pulse: 91 87  Resp: 20 20  Temp: 99 F (37.2 C) 98 F (36.7 C)  SpO2: 96% 97%     EXAM:  Resp: Lungs are clear bilaterally.  No respiratory distress, normal effort. Heart:  Regular without murmurs Abd:  Abdomen is soft, non distended and non tender. No masses are palpable.  There is no rebound and no guarding.  Neurological: Alert and oriented to person, place, and time. Coordination normal.  Skin: Skin is warm and dry. No rash noted. No diaphoretic. No erythema. No pallor.  Psychiatric: Normal mood and affect. Normal behavior. Judgment and thought content normal.   I have independently reviewed his chest x-ray.  I think there has been some improvement in the pleural effusion on the right.   ASSESSMENT: I reviewed with him again the indications and risks of intrapleural thrombolytics.  I believe we should attempt this again today.   PLAN:   We will attempt intrapleural thrombolytics.  He understands that if this fails to completely resolve his pleural effusion that surgery may be required.    Nestor Lewandowsky, MDPatient ID: Daniel Combs, male   DOB: [birth year], 29 y.o.   MRN: [id number]

## 2020-07-02 NOTE — Progress Notes (Signed)
Pt arrived to dialysis unit alert and oriented no s/s of distress \. Goal to remove 1.5L for 3.5 hr treatment

## 2020-07-02 NOTE — Progress Notes (Addendum)
Date of Admission:  06/16/2020      ID: Daniel Combs is a 29 y.o. male Principal Problem:   Sepsis ([place]) Active Problems:   CAP (community acquired pneumonia)   Hypertension   ESRD (end stage renal disease) ([place])   Hyponatremia   Hyperkalemia   Elevated troponin   Anemia in ESRD (end-stage renal disease) ([place])   Bacteremia due to Streptococcus pneumoniae   Pleural effusion on right    Subjective: Says he is feeling better Cough and chest pain better   Medications:  . alteplase (tPA) 10mg  in NS 50mL for Dr.Oaks (intrapleural administration/[facility])   Intrapleural Once  . amLODipine  10 mg Oral Daily  . carvedilol  25 mg Oral BID WC  . Chlorhexidine Gluconate Cloth  6 each Topical Q0600  . Darunavir-Cobicisctat-Emtricitabine-Tenofovir Alafenamide  1 tablet Oral Q supper  . epoetin (EPOGEN/PROCRIT) injection  10,000 Units Intravenous Q M,W,F-HD  . feeding supplement (NEPRO CARB STEADY)  237 mL Oral BID BM  . hydrALAZINE  50 mg Oral Q8H  . multivitamin  1 tablet Oral QHS  . sevelamer carbonate  1,600 mg Oral TID WC  . sulfamethoxazole-trimethoprim  1 tablet Oral Daily    Objective: Vital signs in last 24 hours: Temp:  [98 F (36.7 C)-99 F (37.2 C)] 98.6 F (37 C) (09/27 0930) Pulse Rate:  [87-95] 95 (09/27 1015) Resp:  [18-20] 18 (09/27 1030) BP: (111-120)/(72-81) 114/77 (09/27 1015) SpO2:  [94 %-97 %] 94 % (09/27 1015)  PHYSICAL EXAM:  General: Alert, cooperative, no distress, appears stated age. Getting dialysis Head: Normocephalic,  ENT did not examine Neck: Supple,   Lungs: b/l air entry- decreased rt base Rt chest drain Heart: Regular rate and rhythm, no murmur, rub or gallop. Abdomen: Soft, non-tender,not distended. Bowel sounds normal. No masses Extremities: atraumatic, no cyanosis. No edema. No clubbing Skin: No rashes or lesions. Or bruising Lymph: Cervical, supraclavicular normal. Neurologic: Grossly non-focal  Lab Results Recent Labs     06/30/20 0544 06/30/20 0544 07/01/20 0351 07/02/20 0455  WBC 7.6  --   --   --   HGB 7.9*  --   --   --   HCT 23.8*  --   --   --   NA 131*   < > 131* 132*  K 4.8   < > 4.6 5.2*  CL 95*   < > 92* 93*  CO2 25   < > 24 25  BUN 35*   < > 47* 60*  CREATININE 8.04*   < > 10.64* 12.99*   < > = values in this interval not displayed.   Liver Panel No results for input(s): PROT, ALBUMIN, AST, ALT, ALKPHOS, BILITOT, BILIDIR, IBILI in the last 72 hours. Sedimentation Rate No results for input(s): ESRSEDRATE in the last 72 hours. C-Reactive Protein No results for input(s): CRP in the last 72 hours.  Microbiology: Microbiology: 06/16/20 BC- Strep pneumo 9/20 BC- NG 9/23 Fluid culture NG Studies/Results: DG Chest Port 1 View  Result Date: 07/01/2020 CLINICAL DATA:  Pleural effusion follow-up. EXAM: PORTABLE CHEST 1 VIEW COMPARISON:  June 29, 2020 FINDINGS: Injectable port and right-sided drainage catheter in stable position. The cardiac silhouette is enlarged. Mediastinal contours appear intact. Mild improvement in loculated appearing right pleural effusion. Patchy airspace consolidation in the left upper lobe and right upper lobe not significantly changed. Osseous structures are without acute abnormality. Soft tissues are grossly normal. IMPRESSION: 1. Mild improvement in loculated appearing right pleural effusion. 2. Patchy  airspace consolidation in the left upper lobe and right upper lobe not significantly changed. Electronically Signed   By: Fidela Salisbury M.D.   On: 07/01/2020 10:40     Assessment/Plan: Strep pneumo bacteremia Pneumonia and rt empyema-haschest drain  pleural fluid remains neg  -on ceftriaxone. Day 16 of antibiotic. Because of empyema will need longer treatment-  Accelerated HTN on 4 meds  CKD- stage V- started dialysis this admission  AIDS-on Symtuza andon bactrim for PCP prophylaxis False positive 1:1 rpr( neg TPA) Discussed with care team

## 2020-07-03 ENCOUNTER — Inpatient Hospital Stay: Payer: Medicaid Other

## 2020-07-03 NOTE — Progress Notes (Signed)
ID Pt frustrated with staying in the hospital Says he is okay Patient Vitals for the past 24 hrs:  BP Temp Temp src Pulse Resp SpO2  07/03/20 1144 104/67 98.5 F (36.9 C) Oral 87 -- 95 %  07/03/20 0752 109/62 -- -- 92 -- --  07/03/20 0550 103/66 99.1 F (37.3 C) Oral 96 18 94 %  07/02/20 2105 123/83 (!) 100.4 F (38 C) Oral (!) 109 20 96 %    Had a temp of 100.4 last night but he says he did not have any fever Some nausea  Chest b/l air entry crepts both sides Rt chest drain Rt permacath abd soft CNS non focal   Labs CBC Latest Ref Rng & Units 06/30/2020 06/29/2020 06/29/2020  WBC 4.0 - 10.5 K/uL 7.6 10.2 10.0  Hemoglobin 13.0 - 17.0 g/dL 7.9(L) 8.0(L) 7.7(L)  Hematocrit 39 - 52 % 23.8(L) 23.9(L) 23.6(L)  Platelets 150 - 400 K/uL 353 457(H) 440(H)    CMP Latest Ref Rng & Units 07/02/2020 07/01/2020 06/30/2020  Glucose 70 - 99 mg/dL 85 86 105(H)  BUN 6 - 20 mg/dL 60(H) 47(H) 35(H)  Creatinine 0.61 - 1.24 mg/dL 12.99(H) 10.64(H) 8.04(H)  Sodium 135 - 145 mmol/L 132(L) 131(L) 131(L)  Potassium 3.5 - 5.1 mmol/L 5.2(H) 4.6 4.8  Chloride 98 - 111 mmol/L 93(L) 92(L) 95(L)  CO2 22 - 32 mmol/L 25 24 25   Calcium 8.9 - 10.3 mg/dL 8.7(L) 8.6(L) 8.4(L)  Total Protein 6.5 - 8.1 g/dL - - -  Total Bilirubin 0.3 - 1.2 mg/dL - - -  Alkaline Phos 38 - 126 U/L - - -  AST 15 - 41 U/L - - -  ALT 0 - 44 U/L - - -    Micro BC 9/11- strep pneumo 9/20 BC -NG 9/22- pleural fluid NG    Improved bilateral pulmonary infiltrates. Persistent small bilateral pleural effusions.    Impression/recommendation  Pneumococcus bacteremia with pneumonia b/l with rt pleural effusion which was empyema- Chest drain inserted on 06/27/20 Dr.Oaks wanted him to have another Ct chest because of loculated effusion- He had refused- spoke to him the rationale for a repeat CT and that it is a better study than CXR . Even if the CXR looked better, he has a loculated effusion and before the drain is removed a CT is  helpful. He is amenable to it HE does not want any surgery like decortication. Will discuss with Dr.Oaks tomorrow.  Continue ceftriaxone  Uncontrolled HTN- now on 4 drugs  ESRD- started dialysis this admission  HIV/AIDS_ non compliant- cd4 was 84- he is now on symtuza ( TAF+FTC+Darunavir and cobi)  Also on bactrim for PCP prophylaxis Discussed the management with patient in great detail

## 2020-07-03 NOTE — Progress Notes (Signed)
[facility] [place] 07/03/20  Subjective:   LOS: 17  Patient resting in bed,in no acute distress. Denies SOB, nausea or vomiting.    Objective:  Vital signs in last 24 hours:  Temp:  [98.5 F (36.9 C)-100.4 F (38 C)] 98.5 F (36.9 C) (09/28 1144) Pulse Rate:  [87-109] 87 (09/28 1144) Resp:  [18-20] 18 (09/28 0550) BP: (103-123)/(62-83) 104/67 (09/28 1144) SpO2:  [94 %-96 %] 95 % (09/28 1144)  Weight change:  Filed Weights   06/16/20 2228  Weight: 60.8 kg    Intake/Output:    Intake/Output Summary (Last 24 hours) at 07/03/2020 1419 Last data filed at 07/03/2020 1228 Gross per 24 hour  Intake 0 ml  Output 400 ml  Net -400 ml    Physical Exam: General:  Lying in bed, no acute distress  HEENT  anicteric, moist oral mucous membrane  Pulm/lungs   lungs are clear to auscultation,Rt chest tube intact  CVS/Heart  S1S2 +, regular rhythm, no rub or gallop  Abdomen:   Soft, non tender,non distended  Extremities:  No peripheral edema  Neurologic:  Alert, oriented  Skin:  No acute rashes,chest tube site with dressing clean,dry and intact  Right IJ PermCath Right chest tube in place    Basic Metabolic Panel:  Recent Labs  Lab 06/27/20 1015 06/27/20 1015 06/28/20 0409 06/28/20 0409 06/29/20 0434 06/29/20 0434 06/30/20 0544 07/01/20 0351 07/02/20 0455  NA 130*   < > 133*  --  130*  --  131* 131* 132*  K 4.4   < > 5.4*  --  5.3*  --  4.8 4.6 5.2*  CL 93*   < > 95*  --  92*  --  95* 92* 93*  CO2 24   < > 25  --  23  --  25 24 25   GLUCOSE 94   < > 95  --  91  --  105* 86 85  BUN 46*   < > 38*  --  52*  --  35* 47* 60*  CREATININE 8.17*   < > 7.85*  --  10.64*  --  8.04* 10.64* 12.99*  CALCIUM 8.4*   < > 8.3*   < > 8.3*   < > 8.4* 8.6* 8.7*  PHOS 6.2*  --   --   --   --   --   --   --   --    < > = values in this interval not displayed.     CBC: Recent Labs  Lab 06/27/20 0630 06/27/20 0630 06/27/20 1015 06/28/20 0409  06/29/20 0434 06/29/20 0857 06/30/20 0544  WBC 11.0*   < > 8.7 11.6* 10.0 10.2 7.6  NEUTROABS 7.9*  --   --  8.6* 7.2  --  5.4  HGB 7.6*   < > 7.7* 7.8* 7.7* 8.0* 7.9*  HCT 23.1*   < > 23.5* 23.5* 23.6* 23.9* 23.8*  MCV 84.9   < > 83.6 83.3 86.1 83.0 83.8  PLT 420*   < > 407* 448* 440* 457* 353   < > = values in this interval not displayed.      Lab Results  Component Value Date   HEPBSAG NON REACTIVE 06/17/2020   HEPBIGM NON REACTIVE 06/17/2020      Microbiology:  Recent Results (from the past 240 hour(s))  CULTURE, BLOOD (ROUTINE X 2) w Reflex to ID Panel     Status: None   Collection Time: 06/25/20  7:13  PM   Specimen: BLOOD  Result Value Ref Range Status   Specimen Description BLOOD LEFT ANTECUBITAL  Final   Special Requests   Final    BOTTLES DRAWN AEROBIC AND ANAEROBIC Blood Culture adequate volume   Culture   Final    NO GROWTH 5 DAYS Performed at [facility], [address] [place], [place] [address]    Report Status 06/30/2020 FINAL  Final  CULTURE, BLOOD (ROUTINE X 2) w Reflex to ID Panel     Status: None   Collection Time: 06/25/20  7:27 PM   Specimen: BLOOD  Result Value Ref Range Status   Specimen Description BLOOD BLOOD LEFT HAND  Final   Special Requests   Final    BOTTLES DRAWN AEROBIC AND ANAEROBIC Blood Culture adequate volume   Culture   Final    NO GROWTH 5 DAYS Performed at [facility], [address], [place], [place] [address]    Report Status 06/30/2020 FINAL  Final  MRSA PCR Screening     Status: None   Collection Time: 06/26/20  8:50 PM   Specimen: Nasal Mucosa; Nasopharyngeal  Result Value Ref Range Status   MRSA by PCR NEGATIVE NEGATIVE Final    Comment:        The GeneXpert MRSA Assay (FDA approved for NASAL specimens only), is one component of a comprehensive MRSA colonization surveillance program. It is not intended to diagnose MRSA infection nor to guide or monitor treatment for MRSA  infections. Performed at [facility], [address] [place], [place] [address]   Aerobic/Anaerobic Culture (surgical/deep wound)     Status: None   Collection Time: 06/27/20  4:47 PM   Specimen: Pleural Fluid  Result Value Ref Range Status   Specimen Description   Final    PLEURAL Performed at [facility], [address], [place], [place] [address]    Special Requests   Final    PLEURAL FLUID CHEST Performed at [facility], [address] [place], [place] [address]    Gram Stain   Final    RARE WBC PRESENT,BOTH PMN AND MONONUCLEAR NO ORGANISMS SEEN    Culture   Final    No growth aerobically or anaerobically. Performed at [facility], [place] [address] [place], [place] [address]    Report Status 07/02/2020 FINAL  Final    Coagulation Studies: No results for input(s): LABPROT, INR in the last 72 hours.  Urinalysis: No results for input(s): COLORURINE, LABSPEC, PHURINE, GLUCOSEU, HGBUR, BILIRUBINUR, KETONESUR, PROTEINUR, UROBILINOGEN, NITRITE, LEUKOCYTESUR in the last 72 hours.  Invalid input(s): APPERANCEUR    Imaging: DG Chest 2 View  Result Date: 07/03/2020 CLINICAL DATA:  Right chest tube. EXAM: CHEST - 2 VIEW COMPARISON:  07/01/2020. FINDINGS: Dual-lumen central catheter and right chest tube in stable position. No pneumothorax. Stable cardiomegaly. Bilateral pulmonary infiltrates again, improved from prior exam. Stable small bilateral pleural effusions. IMPRESSION: 1. Dual-lumen central catheter and right chest tube in stable position. No pneumothorax. 2. Improved bilateral pulmonary infiltrates. Persistent small bilateral pleural effusions. Electronically Signed   By: Marcello Moores  Register   On: 07/03/2020 07:48     Medications:   . sodium chloride 250 mL (07/01/20 2118)  . cefTRIAXone (ROCEPHIN)  IV 2 g (07/02/20 2111)   . amLODipine  10 mg Oral Daily  . carvedilol  25 mg Oral BID WC  . Chlorhexidine Gluconate Cloth  6 each  Topical Q0600  . Darunavir-Cobicisctat-Emtricitabine-Tenofovir Alafenamide  1 tablet  Oral Q supper  . epoetin (EPOGEN/PROCRIT) injection  10,000 Units Intravenous Q M,W,F-HD  . feeding supplement (NEPRO CARB STEADY)  237 mL Oral BID BM  . hydrALAZINE  50 mg Oral Q8H  . multivitamin  1 tablet Oral QHS  . sevelamer carbonate  1,600 mg Oral TID WC  . sulfamethoxazole-trimethoprim  1 tablet Oral Daily   sodium chloride, acetaminophen, chlorpheniramine-HYDROcodone, guaiFENesin, hydrALAZINE, labetalol, ondansetron (ZOFRAN) IV, oxyCODONE-acetaminophen, promethazine  Assessment/ Plan:  29 y.o. male with longstanding hypertension, chronic kidney disease, anemia of chronic kidney disease, secondary hyperparathyroidism,HIV  was admitted on 06/16/2020 for  Principal Problem:   Sepsis ([place]) Active Problems:   CAP (community acquired pneumonia)   Hypertension   ESRD (end stage renal disease) ([place])   Hyponatremia   Hyperkalemia   Elevated troponin   Anemia in ESRD (end-stage renal disease) ([place])   Bacteremia due to Streptococcus pneumoniae   Pleural effusion on right  Hyperkalemia [E87.5] Acute renal insufficiency [N28.9] CAP (community acquired pneumonia) [J18.9] Hypertension, unspecified type [I10] Community acquired pneumonia, unspecified laterality [J18.9]  #. ESRD Unclear underlying cause but most likely longstanding hypertension Other differential includes HIV-associated nephropathy as patient had untreated HIV Will continue MWF schedule for dialysis  Outpatient discharge planning for [facility] is in progress,possibly after chest tube removal  #. Anemia of CKD  Lab Results  Component Value Date   HGB 7.9 (L) 06/30/2020   Epogen 10,000 units with HD,MWF  #. Secondary hyperparathyroidism of renal origin N 25.81      Component Value Date/Time   PTH 654 (H) 06/17/2020 2250   Lab Results  Component Value Date   PHOS 6.2 (H) 06/27/2020   Monitor calcium and phos  level during this admission Patient is on Phosphate binder Renvela with meals   #.  Sepsis Secondary to streptococcal pneumonia bacteremia, complex loculated right pleural effusion Underwent TPA instillation on September 23 Followed by general surgery and thoracic surgery He is on Bactrim,started on 06/19/20    LOS: 17 Daniel Combs 9/28/20212:19 [place], [place]

## 2020-07-03 NOTE — Progress Notes (Signed)
PROGRESS NOTE    Daniel Combs  YME:[id number] DOB: 01-Jan-1991 DOA: 06/16/2020 PCP: Pcp, No    Brief Narrative:  29 year old male with history of untreated HIV, hypertension, untreated chronic kidney disease progressing to ESRD who was initially admitted on 06/16/2020 with cough and back pain of 1 week duration.  Has not been adherent to antihypertensive or HIV treatment.  Blood cultures found to be positive for Streptococcus pneumonia.  Associated large right-sided empyema.  Infectious disease, nephrology, cardiothoracic surgery, interventional radiology on consult.  Initially met for sepsis secondary to community-acquired pneumonia.  Completed 10 days of IV Rocephin.  This is when the repeat chest CT showed loculated right pleural effusion.  Antibiotics expanded.  Nephrology consulted for ESRD.  Vascular surgery placed tunneled hemodialysis catheter 06/18/2020.  New start hemodialysis this admission.  Patient status post placement of an image guided right-sided pigtail catheter 06/27/2020.  Cardiothoracic surgery following for intrapleural TPA instillation.  Patient reported some back pain at the chest tube insertion site after first TPA instillation.  Reports his breathing is actually little better.  Infiltrates improving however chest x-rays continue to show loculated pleural effusions despite intrapleural TPA instillation x2.  The patient was seen by cardiothoracic surgery this morning however seem to be unwilling to listen.  The patient is unwilling to discuss the possibility of repeat percutaneous drainage versus surgical intervention.  Cardiothoracic surgery continue to follow however patient is unwilling to listen to their recommendations are options may be limited.   Assessment & Plan:   Principal Problem:   Sepsis ([place]) Active Problems:   CAP (community acquired pneumonia)   Hypertension   ESRD (end stage renal disease) ([place])   Hyponatremia   Hyperkalemia   Elevated troponin    Anemia in ESRD (end-stage renal disease) ([place])   Bacteremia due to Streptococcus pneumoniae   Pleural effusion on right  Sepsis secondary to Streptococcus pneumonia bacteremia Complex loculated right pleural effusion secondary to above Plan: Cardiothoracic surgery following Recommend IR guided placement of right-sided pigtail catheter Status post right-sided pigtail catheter 06/27/2020 CTS following, status post pleural TPA instillation 06/28/2020 Repeat TPA instillation 2020-07-02 On broad-spectrum IV antibiotics, ID following Effusions improving however still demonstrating loculations despite TPA instillation x2.  Patient was unwilling to listen to cardiothoracic surgery trying to explain today. Plan: Continue chest tube.  Suction when he is in room.  Waterseal for ambulation and dialysis. Surgery following for chest tube management   AIDS Patient has been nonadherent to his HIV treatment started on Symtuza during this admission On Bactrim for P JP prophylaxis Infectious disease following  Accelerated hypertension Reasonable control on 4 medications  End-stage renal disease on hemodialysis New HD start this admission Nephrology consult following for HD needs Outpatient HD chair found [place] Monday Wednesday Friday 6:30 AM   DVT prophylaxis: Subcu heparin Code Status: Full Family Communication: None today Disposition Plan: Status is: Inpatient  Remains inpatient appropriate because:Inpatient level of care appropriate due to severity of illness   Dispo: The patient is from: Home              Anticipated d/c is to: Home              Anticipated d/c date is: > 3 days              Patient currently is not medically stable to d/c.  Chest tube in place.  Surgery following her chest tube management.  Disposition plan pending   Consultants:   Nephrology  Infectious disease Cardiothoracic surgery Interventional radiology  Procedures:   Right-sided  chest tube, 06/27/2020  Antimicrobials:   Vancomycin  Cefepime   Subjective: Seen and examined.  Reports improvement in breathing  Objective: Vitals:   07/02/20 2105 07/03/20 0550 07/03/20 0752 07/03/20 1144  BP: 123/83 103/66 109/62 104/67  Pulse: (!) 109 96 92 87  Resp: 20 18    Temp: (!) 100.4 F (38 C) 99.1 F (37.3 C)  98.5 F (36.9 C)  TempSrc: Oral Oral  Oral  SpO2: 96% 94%  95%  Weight:      Height:        Intake/Output Summary (Last 24 hours) at 07/03/2020 1246 Last data filed at 07/03/2020 1228 Gross per 24 hour  Intake 0 ml  Output 1900 ml  Net -1900 ml   Filed Weights   06/16/20 2228  Weight: 60.8 kg    Examination:  General exam: No acute distress Respiratory system: Decreased breath sounds on the right.  Air entry improving.  Right-sided pigtail catheter.  Normal work of breathing.  Room air Cardiovascular system: S1 & S2 heard, RRR. No JVD, murmurs, rubs, gallops or clicks. No pedal edema. Gastrointestinal system: Abdomen is nondistended, soft and nontender. No organomegaly or masses felt. Normal bowel sounds heard. Central nervous system: Alert and oriented. No focal neurological deficits. Extremities: Symmetric 5 x 5 power. Skin: No rashes, lesions or ulcers Psychiatry: Judgement and insight appear normal. Mood & affect appropriate.     Data Reviewed: I have personally reviewed following labs and imaging studies  CBC: Recent Labs  Lab 06/27/20 0630 06/27/20 0630 06/27/20 1015 06/28/20 0409 06/29/20 0434 06/29/20 0857 06/30/20 0544  WBC 11.0*   < > 8.7 11.6* 10.0 10.2 7.6  NEUTROABS 7.9*  --   --  8.6* 7.2  --  5.4  HGB 7.6*   < > 7.7* 7.8* 7.7* 8.0* 7.9*  HCT 23.1*   < > 23.5* 23.5* 23.6* 23.9* 23.8*  MCV 84.9   < > 83.6 83.3 86.1 83.0 83.8  PLT 420*   < > 407* 448* 440* 457* 353   < > = values in this interval not displayed.   Basic Metabolic Panel: Recent Labs  Lab 06/27/20 1015 06/27/20 1015 06/28/20 0409 06/29/20 0434  06/30/20 0544 07/01/20 0351 07/02/20 0455  NA 130*   < > 133* 130* 131* 131* 132*  K 4.4   < > 5.4* 5.3* 4.8 4.6 5.2*  CL 93*   < > 95* 92* 95* 92* 93*  CO2 24   < > '25 23 25 24 25  ' GLUCOSE 94   < > 95 91 105* 86 85  BUN 46*   < > 38* 52* 35* 47* 60*  CREATININE 8.17*   < > 7.85* 10.64* 8.04* 10.64* 12.99*  CALCIUM 8.4*   < > 8.3* 8.3* 8.4* 8.6* 8.7*  PHOS 6.2*  --   --   --   --   --   --    < > = values in this interval not displayed.   GFR: Estimated Creatinine Clearance: 7.2 mL/min (A) (by C-G formula based on SCr of 12.99 mg/dL (H)). Liver Function Tests: Recent Labs  Lab 06/27/20 1015  ALBUMIN 2.1*   No results for input(s): LIPASE, AMYLASE in the last 168 hours. No results for input(s): AMMONIA in the last 168 hours. Coagulation Profile: No results for input(s): INR, PROTIME in the last 168 hours. Cardiac Enzymes: No results for input(s): CKTOTAL, CKMB, CKMBINDEX, TROPONINI  in the last 168 hours. BNP (last 3 results) No results for input(s): PROBNP in the last 8760 hours. HbA1C: No results for input(s): HGBA1C in the last 72 hours. CBG: No results for input(s): GLUCAP in the last 168 hours. Lipid Profile: No results for input(s): CHOL, HDL, LDLCALC, TRIG, CHOLHDL, LDLDIRECT in the last 72 hours. Thyroid Function Tests: No results for input(s): TSH, T4TOTAL, FREET4, T3FREE, THYROIDAB in the last 72 hours. Anemia Panel: No results for input(s): VITAMINB12, FOLATE, FERRITIN, TIBC, IRON, RETICCTPCT in the last 72 hours. Sepsis Labs: Recent Labs  Lab 06/27/20 0630  PROCALCITON <0.10    Recent Results (from the past 240 hour(s))  CULTURE, BLOOD (ROUTINE X 2) w Reflex to ID Panel     Status: None   Collection Time: 06/25/20  7:13 PM   Specimen: BLOOD  Result Value Ref Range Status   Specimen Description BLOOD LEFT ANTECUBITAL  Final   Special Requests   Final    BOTTLES DRAWN AEROBIC AND ANAEROBIC Blood Culture adequate volume   Culture   Final    NO GROWTH 5  DAYS Performed at [facility], [address] [place], [place] [address]    Report Status 06/30/2020 FINAL  Final  CULTURE, BLOOD (ROUTINE X 2) w Reflex to ID Panel     Status: None   Collection Time: 06/25/20  7:27 PM   Specimen: BLOOD  Result Value Ref Range Status   Specimen Description BLOOD BLOOD LEFT HAND  Final   Special Requests   Final    BOTTLES DRAWN AEROBIC AND ANAEROBIC Blood Culture adequate volume   Culture   Final    NO GROWTH 5 DAYS Performed at [facility], [address] [place], [place] [address]    Report Status 06/30/2020 FINAL  Final  MRSA PCR Screening     Status: None   Collection Time: 06/26/20  8:50 PM   Specimen: Nasal Mucosa; Nasopharyngeal  Result Value Ref Range Status   MRSA by PCR NEGATIVE NEGATIVE Final    Comment:        The GeneXpert MRSA Assay (FDA approved for NASAL specimens only), is one component of a comprehensive MRSA colonization surveillance program. It is not intended to diagnose MRSA infection nor to guide or monitor treatment for MRSA infections. Performed at [facility], [address] [place], [place] [address]   Aerobic/Anaerobic Culture (surgical/deep wound)     Status: None   Collection Time: 06/27/20  4:47 PM   Specimen: Pleural Fluid  Result Value Ref Range Status   Specimen Description   Final    PLEURAL Performed at [facility], [address] [place], [place] [address]    Special Requests   Final    PLEURAL FLUID CHEST Performed at [facility], [address] [place], [place] [address]    Gram Stain   Final    RARE WBC PRESENT,BOTH PMN AND MONONUCLEAR NO ORGANISMS SEEN    Culture   Final    No growth aerobically or anaerobically. Performed at [facility], [place] [address] [place], [place] [address]    Report Status 07/02/2020 FINAL  Final         Radiology Studies: DG Chest 2 View  Result Date: 07/03/2020 CLINICAL DATA:  Right chest  tube. EXAM: CHEST - 2 VIEW COMPARISON:  07/01/2020. FINDINGS: Dual-lumen central catheter and right chest tube in stable position. No pneumothorax. Stable cardiomegaly. Bilateral pulmonary infiltrates again, improved from prior exam.  Stable small bilateral pleural effusions. IMPRESSION: 1. Dual-lumen central catheter and right chest tube in stable position. No pneumothorax. 2. Improved bilateral pulmonary infiltrates. Persistent small bilateral pleural effusions. Electronically Signed   By: Marcello Moores  Register   On: 07/03/2020 07:48        Scheduled Meds: . amLODipine  10 mg Oral Daily  . carvedilol  25 mg Oral BID WC  . Chlorhexidine Gluconate Cloth  6 each Topical Q0600  . Darunavir-Cobicisctat-Emtricitabine-Tenofovir Alafenamide  1 tablet Oral Q supper  . epoetin (EPOGEN/PROCRIT) injection  10,000 Units Intravenous Q M,W,F-HD  . feeding supplement (NEPRO CARB STEADY)  237 mL Oral BID BM  . hydrALAZINE  50 mg Oral Q8H  . multivitamin  1 tablet Oral QHS  . sevelamer carbonate  1,600 mg Oral TID WC  . sulfamethoxazole-trimethoprim  1 tablet Oral Daily   Continuous Infusions: . sodium chloride 250 mL (07/01/20 2118)  . cefTRIAXone (ROCEPHIN)  IV 2 g (07/02/20 2111)     LOS: 17 days    Time spent: 15 minutes    Sidney Ace, MD [facility] Pager [phone]  If 7PM-7AM, please contact night-coverage 07/03/2020, 12:46 PM

## 2020-07-03 NOTE — Progress Notes (Signed)
Referring Physician(s): Oaks,T  Supervising Physician: Suttle,D  Patient Status:  [facility] - In-pt  Chief Complaint: Complex right pleural effusion   Subjective: Pt with very flat effect; denies worsening dyspnea/cough/CP   Allergies: Patient has no known allergies.  Medications: Prior to Admission medications   Medication Sig Start Date End Date Taking? Authorizing Provider  losartan (COZAAR) 100 MG tablet Take 100 mg by mouth daily. 05/10/20  Yes [provider]     Vital Signs: BP 109/62   Pulse 92   Temp 99.1 F (37.3 C) (Oral)   Resp 18   Ht 5\' 6"  (1.676 m)   Wt 134 lb (60.8 kg)   SpO2 94%   BMI 21.63 kg/m   Physical Exam awake/alert; rt chest drain intact, insertion site ok, not sig tender, to pleuravac, no obvious air leak, OP 250 cc blood-tinged fluid  Imaging: DG Chest 2 View  Result Date: 07/03/2020 CLINICAL DATA:  Right chest tube. EXAM: CHEST - 2 VIEW COMPARISON:  07/01/2020. FINDINGS: Dual-lumen central catheter and right chest tube in stable position. No pneumothorax. Stable cardiomegaly. Bilateral pulmonary infiltrates again, improved from prior exam. Stable small bilateral pleural effusions. IMPRESSION: 1. Dual-lumen central catheter and right chest tube in stable position. No pneumothorax. 2. Improved bilateral pulmonary infiltrates. Persistent small bilateral pleural effusions. Electronically Signed   By: Marcello Moores  Register   On: 07/03/2020 07:48   DG Chest Port 1 View  Result Date: 07/01/2020 CLINICAL DATA:  Pleural effusion follow-up. EXAM: PORTABLE CHEST 1 VIEW COMPARISON:  June 29, 2020 FINDINGS: Injectable port and right-sided drainage catheter in stable position. The cardiac silhouette is enlarged. Mediastinal contours appear intact. Mild improvement in loculated appearing right pleural effusion. Patchy airspace consolidation in the left upper lobe and right upper lobe not significantly changed. Osseous structures are without acute  abnormality. Soft tissues are grossly normal. IMPRESSION: 1. Mild improvement in loculated appearing right pleural effusion. 2. Patchy airspace consolidation in the left upper lobe and right upper lobe not significantly changed. Electronically Signed   By: Fidela Salisbury M.D.   On: 07/01/2020 10:40   DG Chest Port 1 View  Result Date: 06/29/2020 CLINICAL DATA:  Pleural effusion. EXAM: PORTABLE CHEST 1 VIEW COMPARISON:  06/27/2020.  CT 06/27/2020. FINDINGS: Dual-lumen catheter right chest tube in stable position. Mediastinum hilar structures normal. Stable cardiomegaly. Bilateral pulmonary infiltrates are again noted. Interim partial resolution of right pleural effusion from prior exam. No pneumothorax IMPRESSION: 1. Right chest tube in stable position. Interim partial resolution of right pleural effusion from prior exam. 2. Bilateral pulmonary infiltrates again noted without interim change. 3.  Stable cardiomegaly. Electronically Signed   By: Marcello Moores  Register   On: 06/29/2020 14:17    Labs:  CBC: Recent Labs    06/28/20 0409 06/29/20 0434 06/29/20 0857 06/30/20 0544  WBC 11.6* 10.0 10.2 7.6  HGB 7.8* 7.7* 8.0* 7.9*  HCT 23.5* 23.6* 23.9* 23.8*  PLT 448* 440* 457* 353    COAGS: Recent Labs    06/23/20 0032  INR 1.1  APTT 41*    BMP: Recent Labs    06/29/20 0434 06/30/20 0544 07/01/20 0351 07/02/20 0455  NA 130* 131* 131* 132*  K 5.3* 4.8 4.6 5.2*  CL 92* 95* 92* 93*  CO2 23 25 24 25   GLUCOSE 91 105* 86 85  BUN 52* 35* 47* 60*  CALCIUM 8.3* 8.4* 8.6* 8.7*  CREATININE 10.64* 8.04* 10.64* 12.99*  GFRNONAA 6* 8* 6* 5*  GFRAA 7* 9* 7*  5*    LIVER FUNCTION TESTS: Recent Labs    06/16/20 7353 06/23/20 0911 06/26/20 1012 06/27/20 1015  BILITOT 0.7  --  0.6  --   AST 15  --  32  --   ALT 30  --  29  --   ALKPHOS 63  --  70  --   PROT 8.5*  --  7.1  --   ALBUMIN 3.2* 2.2* 2.1* 2.1*    Assessment and Plan: Pt with hx rt parapneumonic pleural effusion, s/p rt  chest drain placement 9/22; temp 99.1, no new labs today; prev pleural fl cx neg; CXR today with stable positioning of rt chest drain, no ptx; improved bilateral pulmonary infiltrates with persistent small bilateral pleural effusions; received TPA dwell via chest drain yesterday; plans as per thoracic surgery; consider f/u CT to better assess adequacy of drainage once OP minimal   Electronically Signed: D. Rowe Robert, PA-C 07/03/2020, 11:09 AM   I spent a total of 15 minutes at the the patient's bedside AND on the patient's hospital floor or unit, greater than 50% of which was counseling/coordinating care for right chest drain    Patient ID: Daniel Combs, male   DOB: 04/09/1991, 29 y.o.   MRN: [id number]

## 2020-07-03 NOTE — Progress Notes (Signed)
Patient ID: Daniel Combs, male   DOB: 09/10/1991, 29 y.o.   MRN: [id number]  I had the opportunity to see Mr. Daniel Combs today.  He did undergo intrapleural thrombolytics again yesterday.  There was only 250 cc recorded of chest tube drainage after the TPA was instilled  My interactions with him today were unfruitful.  When I asked to turn the volume down on the television he declined stating that he did not need to abide by my wishes.  I attempted to explain to him that his chest x-ray from today which I have independently reviewed shows continued loculated pleural effusions.  He told me that he had been told by other physicians that his x-rays were improving and that he declined to believe my interpretation of the films.  I explained to him that one option would be to obtain a CT scan with continued attempts at percutaneous drainage or surgical intervention.  He again stressed that other physicians have told him that he is improving and he declined to discuss these topics.  Presently it is difficult to know to provide him with adequate surgical consultative services when I am unable to provide him with a full description of the options.  I will continue to follow.  I would recommend that the chest tube be placed to suction when he is in his room but may be placed to waterseal for ambulation and dialysis.  We will continue to follow although our input is somewhat limited.

## 2020-07-04 ENCOUNTER — Inpatient Hospital Stay: Payer: Medicaid Other

## 2020-07-04 LAB — BASIC METABOLIC PANEL
Anion gap: 13 (ref 5–15)
BUN: 47 mg/dL — ABNORMAL HIGH (ref 6–20)
CO2: 26 mmol/L (ref 22–32)
Calcium: 8.7 mg/dL — ABNORMAL LOW (ref 8.9–10.3)
Chloride: 92 mmol/L — ABNORMAL LOW (ref 98–111)
Creatinine, Ser: 11.18 mg/dL — ABNORMAL HIGH (ref 0.61–1.24)
GFR calc Af Amer: 6 mL/min — ABNORMAL LOW (ref >60)
GFR calc non Af Amer: 5 mL/min — ABNORMAL LOW (ref >60)
Glucose, Bld: 83 mg/dL (ref 70–99)
Potassium: 5.2 mmol/L — ABNORMAL HIGH (ref 3.5–5.1)
Sodium: 131 mmol/L — ABNORMAL LOW (ref 135–145)

## 2020-07-04 LAB — CBC WITH DIFFERENTIAL/PLATELET
Abs Immature Granulocytes: 0.07 10*3/uL (ref 0.00–0.07)
Basophils Absolute: 0 10*3/uL (ref 0.0–0.1)
Basophils Relative: 0 %
Eosinophils Absolute: 0.4 10*3/uL (ref 0.0–0.5)
Eosinophils Relative: 5 %
HCT: 23 % — ABNORMAL LOW (ref 39.0–52.0)
Hemoglobin: 7.6 g/dL — ABNORMAL LOW (ref 13.0–17.0)
Immature Granulocytes: 1 %
Lymphocytes Relative: 13 %
Lymphs Abs: 0.9 10*3/uL (ref 0.7–4.0)
MCH: 27.3 pg (ref 26.0–34.0)
MCHC: 33 g/dL (ref 30.0–36.0)
MCV: 82.7 fL (ref 80.0–100.0)
Monocytes Absolute: 1.2 10*3/uL — ABNORMAL HIGH (ref 0.1–1.0)
Monocytes Relative: 17 %
Neutro Abs: 4.4 10*3/uL (ref 1.7–7.7)
Neutrophils Relative %: 64 %
Platelets: 327 10*3/uL (ref 150–400)
RBC: 2.78 MIL/uL — ABNORMAL LOW (ref 4.22–5.81)
RDW: 14.6 % (ref 11.5–15.5)
WBC: 6.9 10*3/uL (ref 4.0–10.5)
nRBC: 0 % (ref 0.0–0.2)

## 2020-07-04 NOTE — Progress Notes (Signed)
PROGRESS NOTE    Daniel Combs  QVZ:[id number] DOB: Dec 08, 1990 DOA: 06/16/2020 PCP: Pcp, No    Brief Narrative:  29 year old male with history of untreated HIV, hypertension, untreated chronic kidney disease progressing to ESRD who was initially admitted on 06/16/2020 with cough and back pain of 1 week duration.  Has not been adherent to antihypertensive or HIV treatment.  Blood cultures found to be positive for Streptococcus pneumonia.  Associated large right-sided empyema.  Infectious disease, nephrology, cardiothoracic surgery, interventional radiology on consult.  Initially met for sepsis secondary to community-acquired pneumonia.  Completed 10 days of IV Rocephin.  This is when the repeat chest CT showed loculated right pleural effusion.  Antibiotics expanded.  Nephrology consulted for ESRD.  Vascular surgery placed tunneled hemodialysis catheter 06/18/2020.  New start hemodialysis this admission.  Patient status post placement of an image guided right-sided pigtail catheter 06/27/2020.  Cardiothoracic surgery following for intrapleural TPA instillation.  Patient reported some back pain at the chest tube insertion site after first TPA instillation.  Reports his breathing is actually little better.  Infiltrates improving however chest x-rays continue to show loculated pleural effusions despite intrapleural TPA instillation x2.  The patient was seen by cardiothoracic surgery this morning however seem to be unwilling to listen.  The patient is unwilling to discuss the possibility of repeat percutaneous drainage versus surgical intervention.  Cardiothoracic surgery continue to follow however patient is unwilling to listen to their recommendations are options may be limited.   Assessment & Plan:   Principal Problem:   Sepsis ([place]) Active Problems:   CAP (community acquired pneumonia)   Hypertension   ESRD (end stage renal disease) ([place])   Hyponatremia   Hyperkalemia   Elevated troponin    Anemia in ESRD (end-stage renal disease) ([place])   Bacteremia due to Streptococcus pneumoniae   Pleural effusion on right  Sepsis secondary to Streptococcus pneumonia bacteremia Complex loculated right pleural effusion secondary to above Status post right-sided pigtail catheter 06/27/2020 CTS following, status post pleural TPA instillation 06/28/2020 Repeat TPA instillation 2020-07-02 On broad-spectrum IV antibiotics, ID following Effusions improving however still demonstrating loculations despite TPA instillation x2.   Plan: --Continue chest tube.  Suction when he is in room.  Waterseal for ambulation and dialysis. --CT Surgery following for chest tube management --CT chest today  --continue ceftriaxone  AIDS Patient has been nonadherent to his HIV treatment started on Symtuza during this admission and Bactrim for P JP prophylaxis Infectious disease following PLAN: --continue Symtuza and Bactrim --TOC and pharm helping to figure out how to order his HIV meds at discharge  Accelerated hypertension --continue amlodipine, coreg, hydralazine  End-stage renal disease on hemodialysis New HD start this admission Outpatient HD chair found [facility] [place] Monday Wednesday Friday 6:30 AM PLAN: --continue iHD per nephrology  Anemia of chronic disease and iron def --anemia workup showed iron def --Epo with dialysis --will need iron supplement   DVT prophylaxis: Heparin SQ Code Status: Full code  Family Communication:  Status is: inpatient Dispo:   The patient is from: home Anticipated d/c is to: home Anticipated d/c date is: 2-3 days Patient currently is not medically stable to d/c due to: on IV abx for empyema, still has chest tube.    Consultants:   Nephrology  Infectious disease Cardiothoracic surgery Interventional radiology  Procedures:   Right-sided chest tube, 06/27/2020  Antimicrobials:   Vancomycin  Cefepime   Subjective: Pt had no complaints,  just wanted to go home.  Little chest tube  output.  Pt agreeable to CT chest today.   Objective: Vitals:   07/04/20 1530 07/04/20 1545 07/04/20 1600 07/04/20 1608  BP: 126/90 128/88 125/88   Pulse:      Resp: _0 (!) 21  Temp:      TempSrc:      SpO2:      Weight:      Height:        Intake/Output Summary (Last 24 hours) at 07/04/2020 1901 Last data filed at 07/04/2020 1608 Gross per 24 hour  Intake 0 ml  Output 1000 ml  Net -1000 ml   Filed Weights   06/16/20 2228  Weight: 60.8 kg    Examination:  Constitutional: NAD, AAOx3 HEENT: conjunctivae and lids normal, EOMI CV: No cyanosis.   RESP: normal respiratory effort, on RA.  Chest tube present Extremities: No effusions, edema in BLE SKIN: warm, dry and intact Neuro: II - XII grossly intact.     Data Reviewed: I have personally reviewed following labs and imaging studies  CBC: Recent Labs  Lab 06/28/20 0409 06/29/20 0434 06/29/20 0857 06/30/20 0544 07/04/20 0404  WBC 11.6* 10.0 10.2 7.6 6.9  NEUTROABS 8.6* 7.2  --  5.4 4.4  HGB 7.8* 7.7* 8.0* 7.9* 7.6*  HCT 23.5* 23.6* 23.9* 23.8* 23.0*  MCV 83.3 86.1 83.0 83.8 82.7  PLT 448* 440* 457* 353 818   Basic Metabolic Panel: Recent Labs  Lab 06/29/20 0434 06/30/20 0544 07/01/20 0351 07/02/20 0455 07/04/20 0404  NA 130* 131* 131* 132* 131*  K 5.3* 4.8 4.6 5.2* 5.2*  CL 92* 95* 92* 93* 92*  CO2 _1 GLUCOSE 91 105* 86 85 83  BUN 52* 35* 47* 60* 47*  CREATININE 10.64* 8.04* 10.64* 12.99* 11.18*  CALCIUM 8.3* 8.4* 8.6* 8.7* 8.7*   GFR: Estimated Creatinine Clearance: 8.3 mL/min (A) (by C-G formula based on SCr of 11.18 mg/dL (H)). Liver Function Tests: No results for input(s): AST, ALT, ALKPHOS, BILITOT, PROT, ALBUMIN in the last 168 hours. No results for input(s): LIPASE, AMYLASE in the last 168 hours. No results for input(s): AMMONIA in the last 168 hours. Coagulation Profile: No results for input(s): INR, PROTIME in the last 168  hours. Cardiac Enzymes: No results for input(s): CKTOTAL, CKMB, CKMBINDEX, TROPONINI in the last 168 hours. BNP (last 3 results) No results for input(s): PROBNP in the last 8760 hours. HbA1C: No results for input(s): HGBA1C in the last 72 hours. CBG: No results for input(s): GLUCAP in the last 168 hours. Lipid Profile: No results for input(s): CHOL, HDL, LDLCALC, TRIG, CHOLHDL, LDLDIRECT in the last 72 hours. Thyroid Function Tests: No results for input(s): TSH, T4TOTAL, FREET4, T3FREE, THYROIDAB in the last 72 hours. Anemia Panel: No results for input(s): VITAMINB12, FOLATE, FERRITIN, TIBC, IRON, RETICCTPCT in the last 72 hours. Sepsis Labs: No results for input(s): PROCALCITON, LATICACIDVEN in the last 168 hours.  Recent Results (from the past 240 hour(s))  CULTURE, BLOOD (ROUTINE X 2) w Reflex to ID Panel     Status: None   Collection Time: 06/25/20  7:13 PM   Specimen: BLOOD  Result Value Ref Range Status   Specimen Description BLOOD LEFT ANTECUBITAL  Final   Special Requests   Final    BOTTLES DRAWN AEROBIC AND ANAEROBIC Blood Culture adequate volume   Culture   Final    NO GROWTH 5 DAYS Performed at [facility], [address], [place], [place] [address]    Report Status  06/30/2020 FINAL  Final  CULTURE, BLOOD (ROUTINE X 2) w Reflex to ID Panel     Status: None   Collection Time: 06/25/20  7:27 PM   Specimen: BLOOD  Result Value Ref Range Status   Specimen Description BLOOD BLOOD LEFT HAND  Final   Special Requests   Final    BOTTLES DRAWN AEROBIC AND ANAEROBIC Blood Culture adequate volume   Culture   Final    NO GROWTH 5 DAYS Performed at [facility], [address] [place], [place] [address]    Report Status 06/30/2020 FINAL  Final  MRSA PCR Screening     Status: None   Collection Time: 06/26/20  8:50 PM   Specimen: Nasal Mucosa; Nasopharyngeal  Result Value Ref Range Status   MRSA by PCR NEGATIVE NEGATIVE Final    Comment:        The  GeneXpert MRSA Assay (FDA approved for NASAL specimens only), is one component of a comprehensive MRSA colonization surveillance program. It is not intended to diagnose MRSA infection nor to guide or monitor treatment for MRSA infections. Performed at [facility], [address] [place], [place] [address]   Aerobic/Anaerobic Culture (surgical/deep wound)     Status: None   Collection Time: 06/27/20  4:47 PM   Specimen: Pleural Fluid  Result Value Ref Range Status   Specimen Description   Final    PLEURAL Performed at [facility], [address], [place], [place] [address]    Special Requests   Final    PLEURAL FLUID CHEST Performed at [facility], [address] [place], [place] [address]    Gram Stain   Final    RARE WBC PRESENT,BOTH PMN AND MONONUCLEAR NO ORGANISMS SEEN    Culture   Final    No growth aerobically or anaerobically. Performed at [facility], [place] [address] [place], [place] [address]    Report Status 07/02/2020 FINAL  Final         Radiology Studies: DG Chest 2 View  Result Date: 07/03/2020 CLINICAL DATA:  Right chest tube. EXAM: CHEST - 2 VIEW COMPARISON:  07/01/2020. FINDINGS: Dual-lumen central catheter and right chest tube in stable position. No pneumothorax. Stable cardiomegaly. Bilateral pulmonary infiltrates again, improved from prior exam. Stable small bilateral pleural effusions. IMPRESSION: 1. Dual-lumen central catheter and right chest tube in stable position. No pneumothorax. 2. Improved bilateral pulmonary infiltrates. Persistent small bilateral pleural effusions. Electronically Signed   By: Marcello Moores  Register   On: 07/03/2020 07:48        Scheduled Meds: . amLODipine  10 mg Oral Daily  . carvedilol  25 mg Oral BID WC  . Chlorhexidine Gluconate Cloth  6 each Topical Q0600  . Darunavir-Cobicisctat-Emtricitabine-Tenofovir Alafenamide  1 tablet Oral Q supper  . epoetin (EPOGEN/PROCRIT) injection   10,000 Units Intravenous Q M,W,F-HD  . feeding supplement (NEPRO CARB STEADY)  237 mL Oral BID BM  . hydrALAZINE  50 mg Oral Q8H  . multivitamin  1 tablet Oral QHS  . sevelamer carbonate  1,600 mg Oral TID WC  . sulfamethoxazole-trimethoprim  1 tablet Oral Daily   Continuous Infusions: . sodium chloride 250 mL (07/01/20 2118)  . cefTRIAXone (ROCEPHIN)  IV 2 g (07/03/20 2110)     LOS: 18 days     Enzo Bi, MD [facility] Pager [phone]  If 7PM-7AM, please contact night-coverage 07/04/2020, 7:01 PM

## 2020-07-04 NOTE — Progress Notes (Signed)
[facility], [place] 07/04/20  Subjective:   LOS: 18  Patient is getting dialysis today. He still has the chest tube in place, no acute respiratory distress noted.Outpatient dialysis planning in progress.  Objective:  Vital signs in last 24 hours:  Temp:  [98 F (36.7 C)-98.6 F (37 C)] 98 F (36.7 C) (09/29 1203) Pulse Rate:  [88-92] 92 (09/29 1203) Resp:  [16-20] 16 (09/29 1203) BP: (111-117)/(72-80) 117/80 (09/29 1203) SpO2:  [96 %-97 %] 96 % (09/29 1203)  Weight change:  Filed Weights   06/16/20 2228  Weight: 60.8 kg    Intake/Output:    Intake/Output Summary (Last 24 hours) at 07/04/2020 1305 Last data filed at 07/03/2020 2100 Gross per 24 hour  Intake 0 ml  Output 0 ml  Net 0 ml    Physical Exam: General:  Resting in bed, flat affect  HEENT  Normocephalic,atraumatic  Pulm/lungs   lungs clear to auscultation,diminished at the right side  CVS/Heart  Regular rhythm, no rub or gallop  Abdomen:   Soft, non tender,non distended  Extremities:  No peripheral edema  Neurologic:  Alert, oriented  Skin:  No acute rashes  Right IJ PermCath Right chest tube in place    Basic Metabolic Panel:  Recent Labs  Lab 06/29/20 0434 06/29/20 0434 06/30/20 0544 06/30/20 0544 07/01/20 0351 07/02/20 0455 07/04/20 0404  NA 130*  --  131*  --  131* 132* 131*  K 5.3*  --  4.8  --  4.6 5.2* 5.2*  CL 92*  --  95*  --  92* 93* 92*  CO2 23  --  25  --  24 25 26   GLUCOSE 91  --  105*  --  86 85 83  BUN 52*  --  35*  --  47* 60* 47*  CREATININE 10.64*  --  8.04*  --  10.64* 12.99* 11.18*  CALCIUM 8.3*   < > 8.4*   < > 8.6* 8.7* 8.7*   < > = values in this interval not displayed.     CBC: Recent Labs  Lab 06/28/20 0409 06/29/20 0434 06/29/20 0857 06/30/20 0544 07/04/20 0404  WBC 11.6* 10.0 10.2 7.6 6.9  NEUTROABS 8.6* 7.2  --  5.4 4.4  HGB 7.8* 7.7* 8.0* 7.9* 7.6*  HCT 23.5* 23.6* 23.9* 23.8* 23.0*  MCV 83.3 86.1 83.0 83.8 82.7  PLT  448* 440* 457* 353 327      Lab Results  Component Value Date   HEPBSAG NON REACTIVE 06/17/2020   HEPBIGM NON REACTIVE 06/17/2020      Microbiology:  Recent Results (from the past 240 hour(s))  CULTURE, BLOOD (ROUTINE X 2) w Reflex to ID Panel     Status: None   Collection Time: 06/25/20  7:13 PM   Specimen: BLOOD  Result Value Ref Range Status   Specimen Description BLOOD LEFT ANTECUBITAL  Final   Special Requests   Final    BOTTLES DRAWN AEROBIC AND ANAEROBIC Blood Culture adequate volume   Culture   Final    NO GROWTH 5 DAYS Performed at [facility], [address] [place], [place] [address]    Report Status 06/30/2020 FINAL  Final  CULTURE, BLOOD (ROUTINE X 2) w Reflex to ID Panel     Status: None   Collection Time: 06/25/20  7:27 PM   Specimen: BLOOD  Result Value Ref Range Status   Specimen Description BLOOD BLOOD LEFT HAND  Final   Special Requests  Final    BOTTLES DRAWN AEROBIC AND ANAEROBIC Blood Culture adequate volume   Culture   Final    NO GROWTH 5 DAYS Performed at [facility], [address] [place], [place] [address]    Report Status 06/30/2020 FINAL  Final  MRSA PCR Screening     Status: None   Collection Time: 06/26/20  8:50 PM   Specimen: Nasal Mucosa; Nasopharyngeal  Result Value Ref Range Status   MRSA by PCR NEGATIVE NEGATIVE Final    Comment:        The GeneXpert MRSA Assay (FDA approved for NASAL specimens only), is one component of a comprehensive MRSA colonization surveillance program. It is not intended to diagnose MRSA infection nor to guide or monitor treatment for MRSA infections. Performed at [facility], [address] [place], [place] [address]   Aerobic/Anaerobic Culture (surgical/deep wound)     Status: None   Collection Time: 06/27/20  4:47 PM   Specimen: Pleural Fluid  Result Value Ref Range Status   Specimen Description   Final    PLEURAL Performed at [facility], [address] [place], [place] [address]    Special Requests   Final    PLEURAL FLUID CHEST Performed at [facility], [address] [place], [place] [address]    Gram Stain   Final    RARE WBC PRESENT,BOTH PMN AND MONONUCLEAR NO ORGANISMS SEEN    Culture   Final    No growth aerobically or anaerobically. Performed at [facility], [place] [address], [place], [place] [address]    Report Status 07/02/2020 FINAL  Final    Coagulation Studies: No results for input(s): LABPROT, INR in the last 72 hours.  Urinalysis: No results for input(s): COLORURINE, LABSPEC, PHURINE, GLUCOSEU, HGBUR, BILIRUBINUR, KETONESUR, PROTEINUR, UROBILINOGEN, NITRITE, LEUKOCYTESUR in the last 72 hours.  Invalid input(s): APPERANCEUR    Imaging: DG Chest 2 View  Result Date: 07/03/2020 CLINICAL DATA:  Right chest tube. EXAM: CHEST - 2 VIEW COMPARISON:  07/01/2020. FINDINGS: Dual-lumen central catheter and right chest tube in stable position. No pneumothorax. Stable cardiomegaly. Bilateral pulmonary infiltrates again, improved from prior exam. Stable small bilateral pleural effusions. IMPRESSION: 1. Dual-lumen central catheter and right chest tube in stable position. No pneumothorax. 2. Improved bilateral pulmonary infiltrates. Persistent small bilateral pleural effusions. Electronically Signed   By: Marcello Moores  Register   On: 07/03/2020 07:48     Medications:   . sodium chloride 250 mL (07/01/20 2118)  . cefTRIAXone (ROCEPHIN)  IV 2 g (07/03/20 2110)   . amLODipine  10 mg Oral Daily  . carvedilol  25 mg Oral BID WC  . Chlorhexidine Gluconate Cloth  6 each Topical Q0600  . Darunavir-Cobicisctat-Emtricitabine-Tenofovir Alafenamide  1 tablet Oral Q supper  . epoetin (EPOGEN/PROCRIT) injection  10,000 Units Intravenous Q M,W,F-HD  . feeding supplement (NEPRO CARB STEADY)  237 mL Oral BID BM  . hydrALAZINE  50 mg Oral Q8H  . multivitamin  1 tablet Oral QHS  . sevelamer carbonate  1,600 mg Oral  TID WC  . sulfamethoxazole-trimethoprim  1 tablet Oral Daily   sodium chloride, acetaminophen, chlorpheniramine-HYDROcodone, guaiFENesin, hydrALAZINE, labetalol, ondansetron (ZOFRAN) IV, oxyCODONE-acetaminophen, promethazine  Assessment/ Plan:  29 y.o. male with longstanding hypertension, chronic kidney disease, anemia of chronic kidney disease, secondary hyperparathyroidism,HIV  was admitted on 06/16/2020 for  Principal Problem:   Sepsis [facility]) Active Problems:   CAP (community acquired pneumonia)   Hypertension   ESRD (end stage  renal disease) ([place])   Hyponatremia   Hyperkalemia   Elevated troponin   Anemia in ESRD (end-stage renal disease) ([place])   Bacteremia due to Streptococcus pneumoniae   Pleural effusion on right  Hyperkalemia [E87.5] Acute renal insufficiency [N28.9] CAP (community acquired pneumonia) [J18.9] Hypertension, unspecified type [I10] Community acquired pneumonia, unspecified laterality [J18.9]  #. ESRD Unclear underlying cause but most likely longstanding hypertension Other differential includes HIV-associated nephropathy as patient had untreated HIV  Receiving dialysis today Will continue MWF schedule for dialysis  Outpatient discharge planning for [facility] is in progress  #. Anemia of CKD  Lab Results  Component Value Date   HGB 7.6 (L) 07/04/2020   Continue Epogen   #. Secondary hyperparathyroidism of renal origin N 25.81      Component Value Date/Time   PTH 654 (H) 06/17/2020 2250   Lab Results  Component Value Date   PHOS 6.2 (H) 06/27/2020   Patient is on Phosphate binder Renvela with meals Will continue monitoring lab values  #.  Sepsis Secondary to streptococcal pneumonia bacteremia, complex loculated right pleural effusion Underwent TPA instillation on September 23 Followed by general surgery and thoracic surgery  Continue antibiotic regimen per ID team    LOS: [address] 9/29/20211:05 PM  [place], [place]

## 2020-07-05 ENCOUNTER — Inpatient Hospital Stay: Payer: Medicaid Other

## 2020-07-05 LAB — BASIC METABOLIC PANEL
Anion gap: 11 (ref 5–15)
BUN: 29 mg/dL — ABNORMAL HIGH (ref 6–20)
CO2: 29 mmol/L (ref 22–32)
Calcium: 8.4 mg/dL — ABNORMAL LOW (ref 8.9–10.3)
Chloride: 94 mmol/L — ABNORMAL LOW (ref 98–111)
Creatinine, Ser: 7.54 mg/dL — ABNORMAL HIGH (ref 0.61–1.24)
GFR calc Af Amer: 10 mL/min — ABNORMAL LOW (ref >60)
GFR calc non Af Amer: 9 mL/min — ABNORMAL LOW (ref >60)
Glucose, Bld: 90 mg/dL (ref 70–99)
Potassium: 4.3 mmol/L (ref 3.5–5.1)
Sodium: 134 mmol/L — ABNORMAL LOW (ref 135–145)

## 2020-07-05 LAB — CBC
HCT: 21.8 % — ABNORMAL LOW (ref 39.0–52.0)
Hemoglobin: 7.4 g/dL — ABNORMAL LOW (ref 13.0–17.0)
MCH: 28.1 pg (ref 26.0–34.0)
MCHC: 33.9 g/dL (ref 30.0–36.0)
MCV: 82.9 fL (ref 80.0–100.0)
Platelets: 298 10*3/uL (ref 150–400)
RBC: 2.63 MIL/uL — ABNORMAL LOW (ref 4.22–5.81)
RDW: 14.6 % (ref 11.5–15.5)
WBC: 6.7 10*3/uL (ref 4.0–10.5)
nRBC: 0 % (ref 0.0–0.2)

## 2020-07-05 LAB — GENOSURE PRIME (GSPRIL)

## 2020-07-05 LAB — MAGNESIUM: Magnesium: 1.8 mg/dL (ref 1.7–2.4)

## 2020-07-05 MED ORDER — LEVOFLOXACIN IN D5W 500 MG/100ML IV SOLN: 500.0000 mg | INTRAVENOUS | Status: DC

## 2020-07-05 MED ORDER — LEVOFLOXACIN IN D5W 750 MG/150ML IV SOLN
750.0000 mg | Freq: Once | INTRAVENOUS | Status: AC
  Administered 2020-07-05: 750 mg via INTRAVENOUS
  Filled 2020-07-05: qty 150

## 2020-07-05 NOTE — Progress Notes (Signed)
[facility], [place] 07/05/20  Subjective:   LOS: 19  Patient resting in bed, denies SOB, nausea or vomiting. Plan to d/c his chest tube possible today by cardiothoracic team.Patient has outpatient dialysis set up if he is getting discharged today.  Objective:  Vital signs in last 24 hours:  Temp:  [98.8 F (37.1 C)-99.3 F (37.4 C)] 98.8 F (37.1 C) (09/30 0951) Pulse Rate:  [93-97] 96 (09/30 0951) Resp:  [15-21] 20 (09/30 0412) BP: (111-129)/(69-90) 111/69 (09/30 0951) SpO2:  [95 %-98 %] 98 % (09/30 0951)  Weight change:  Filed Weights   06/16/20 2228  Weight: 60.8 kg    Intake/Output:    Intake/Output Summary (Last 24 hours) at 07/05/2020 1348 Last data filed at 07/05/2020 0405 Gross per 24 hour  Intake 390 ml  Output 1000 ml  Net -610 ml    Physical Exam: General:  In no acute distress  HEENT  Normocephalic,atraumatic  Pulm/lungs   Normal and symmetrical respiratory effort, lungs clear  CVS/Heart  S1S2 ,HR in 90's  Abdomen:   Soft, non tender,non distended  Extremities:  No peripheral edema  Neurologic:  Alert, oriented x3  Skin:  No acute rashes or lesions  Right IJ PermCath Right chest tube in place    Basic Metabolic Panel:  Recent Labs  Lab 06/30/20 0544 06/30/20 0544 07/01/20 0351 07/01/20 0351 07/02/20 0455 07/04/20 0404 07/05/20 0510  NA 131*  --  131*  --  132* 131* 134*  K 4.8  --  4.6  --  5.2* 5.2* 4.3  CL 95*  --  92*  --  93* 92* 94*  CO2 25  --  24  --  25 26 29   GLUCOSE 105*  --  86  --  85 83 90  BUN 35*  --  47*  --  60* 47* 29*  CREATININE 8.04*  --  10.64*  --  12.99* 11.18* 7.54*  CALCIUM 8.4*   < > 8.6*   < > 8.7* 8.7* 8.4*  MG  --   --   --   --   --   --  1.8   < > = values in this interval not displayed.     CBC: Recent Labs  Lab 06/29/20 0434 06/29/20 0857 06/30/20 0544 07/04/20 0404 07/05/20 0510  WBC 10.0 10.2 7.6 6.9 6.7  NEUTROABS 7.2  --  5.4 4.4  --   HGB 7.7* 8.0* 7.9*  7.6* 7.4*  HCT 23.6* 23.9* 23.8* 23.0* 21.8*  MCV 86.1 83.0 83.8 82.7 82.9  PLT 440* 457* 353 327 298      Lab Results  Component Value Date   HEPBSAG NON REACTIVE 06/17/2020   HEPBIGM NON REACTIVE 06/17/2020      Microbiology:  Recent Results (from the past 240 hour(s))  CULTURE, BLOOD (ROUTINE X 2) w Reflex to ID Panel     Status: None   Collection Time: 06/25/20  7:13 PM   Specimen: BLOOD  Result Value Ref Range Status   Specimen Description BLOOD LEFT ANTECUBITAL  Final   Special Requests   Final    BOTTLES DRAWN AEROBIC AND ANAEROBIC Blood Culture adequate volume   Culture   Final    NO GROWTH 5 DAYS Performed at [facility], [address] [place], [place] [address]    Report Status 06/30/2020 FINAL  Final  CULTURE, BLOOD (ROUTINE X 2) w Reflex to ID Panel     Status: None  Collection Time: 06/25/20  7:27 PM   Specimen: BLOOD  Result Value Ref Range Status   Specimen Description BLOOD BLOOD LEFT HAND  Final   Special Requests   Final    BOTTLES DRAWN AEROBIC AND ANAEROBIC Blood Culture adequate volume   Culture   Final    NO GROWTH 5 DAYS Performed at [facility], [address] [place], [place] [address]    Report Status 06/30/2020 FINAL  Final  MRSA PCR Screening     Status: None   Collection Time: 06/26/20  8:50 PM   Specimen: Nasal Mucosa; Nasopharyngeal  Result Value Ref Range Status   MRSA by PCR NEGATIVE NEGATIVE Final    Comment:        The GeneXpert MRSA Assay (FDA approved for NASAL specimens only), is one component of a comprehensive MRSA colonization surveillance program. It is not intended to diagnose MRSA infection nor to guide or monitor treatment for MRSA infections. Performed at [facility], [address] [place], [place] [address]   Aerobic/Anaerobic Culture (surgical/deep wound)     Status: None   Collection Time: 06/27/20  4:47 PM   Specimen: Pleural Fluid  Result Value Ref Range Status    Specimen Description   Final    PLEURAL Performed at [facility], [address] [place], [place] [address]    Special Requests   Final    PLEURAL FLUID CHEST Performed at [facility], [address] [place], [place] [address]    Gram Stain   Final    RARE WBC PRESENT,BOTH PMN AND MONONUCLEAR NO ORGANISMS SEEN    Culture   Final    No growth aerobically or anaerobically. Performed at [facility], [place] [address], [place], [place] [address]    Report Status 07/02/2020 FINAL  Final    Coagulation Studies: No results for input(s): LABPROT, INR in the last 72 hours.  Urinalysis: No results for input(s): COLORURINE, LABSPEC, PHURINE, GLUCOSEU, HGBUR, BILIRUBINUR, KETONESUR, PROTEINUR, UROBILINOGEN, NITRITE, LEUKOCYTESUR in the last 72 hours.  Invalid input(s): APPERANCEUR    Imaging: CT CHEST WO CONTRAST  Result Date: 07/04/2020 CLINICAL DATA:  Pneumonia, effusion. EXAM: CT CHEST WITHOUT CONTRAST TECHNIQUE: Multidetector CT imaging of the chest was performed following the standard protocol without IV contrast. COMPARISON:  CT chest 06/25/2020 FINDINGS: Cardiovascular: Right IJ central venous catheter with the tip in the right atrium. Similar cardiomegaly. No evidence of a pericardial effusion. Mediastinum/Nodes: Limited evaluation without IV contrast without evidence of adenopathy. Lungs/Pleura: Slightly improved, but persistent consolidation and ground-glass opacification within the left upper lobe. Slightly improved ground-glass opacities in the right upper lobe. No pneumothorax. There is a pigtail drain within the right posterior pleural space with associated decrease in size of a now small right pleural effusion. There is fluid tracking along the right major fissure. There is also loculated fluid extending more superiorly. New small to moderate left pleural effusion with overlying atelectasis. Upper Abdomen: Hyperdensity within the gallbladder, possibly  sludge. No evidence of acute abnormality in the visualized upper abdomen. Musculoskeletal: No acute osseous abnormality. IMPRESSION: 1. Slightly improved, but persistent multifocal consolidative and ground-glass opacities compatible with multifocal pneumonia. 2. Decrased size of a now small loculated right pleural effusion after placement of a pleural drain, as above. 3. New small to moderate left pleural effusion. Electronically Signed   By: Margaretha Sheffield MD   On: 07/04/2020 20:35     Medications:   . sodium chloride 250 mL (07/01/20  2118)  . cefTRIAXone (ROCEPHIN)  IV Stopped (07/04/20 2127)   . amLODipine  10 mg Oral Daily  . carvedilol  25 mg Oral BID WC  . Chlorhexidine Gluconate Cloth  6 each Topical Q0600  . Darunavir-Cobicisctat-Emtricitabine-Tenofovir Alafenamide  1 tablet Oral Q supper  . epoetin (EPOGEN/PROCRIT) injection  10,000 Units Intravenous Q M,W,F-HD  . feeding supplement (NEPRO CARB STEADY)  237 mL Oral BID BM  . hydrALAZINE  50 mg Oral Q8H  . multivitamin  1 tablet Oral QHS  . sevelamer carbonate  1,600 mg Oral TID WC  . sulfamethoxazole-trimethoprim  1 tablet Oral Daily   sodium chloride, acetaminophen, chlorpheniramine-HYDROcodone, guaiFENesin, hydrALAZINE, labetalol, ondansetron (ZOFRAN) IV, oxyCODONE-acetaminophen, promethazine  Assessment/ Plan:  29 y.o. male with longstanding hypertension, chronic kidney disease, anemia of chronic kidney disease, secondary hyperparathyroidism,HIV  was admitted on 06/16/2020 for  Principal Problem:   Sepsis ([place]) Active Problems:   CAP (community acquired pneumonia)   Hypertension   ESRD (end stage renal disease) ([place])   Hyponatremia   Hyperkalemia   Elevated troponin   Anemia in ESRD (end-stage renal disease) ([place])   Bacteremia due to Streptococcus pneumoniae   Pleural effusion on right  Hyperkalemia [E87.5] Acute renal insufficiency [N28.9] CAP (community acquired pneumonia) [J18.9] Hypertension, unspecified  type [I10] Community acquired pneumonia, unspecified laterality [J18.9]  #. ESRD Unclear underlying cause but most likely longstanding hypertension Other differential includes HIV-associated nephropathy as patient had untreated HIV  Electrolytes and volume status acceptable, no need for additional dialysis Will continue MWF schedule for dialysis  Outpatient dialysis set up in  [facility].  #. Anemia of CKD  Lab Results  Component Value Date   HGB 7.4 (L) 07/05/2020   Continue Epogen with dialysis  #. Secondary hyperparathyroidism of renal origin N 25.81      Component Value Date/Time   PTH 654 (H) 06/17/2020 2250   Lab Results  Component Value Date   PHOS 6.2 (H) 06/27/2020   Patient is on Phosphate binder Renvela with meals   #.  Sepsis Secondary to streptococcal pneumonia bacteremia, complex loculated right pleural effusion Underwent TPA instillation on September 23 Followed by general surgery and thoracic surgery  Chest tube with minimal drainage. Thoracic surgery team planning for repeat C-Xray today and possible chest tube removal.    LOS: [address] 9/30/20211:48 PM  [place], [place]

## 2020-07-05 NOTE — Progress Notes (Signed)
Patient was offered a sponge bath and bed linen change but refused per nurse tech [name].  Daniel Combs 07/05/2020  4:41 AM

## 2020-07-05 NOTE — Progress Notes (Signed)
Patient ID: Daniel Combs, male   DOB: [birth year], 29 y.o.   MRN: [id number]  I had the opportunity today to review his CT scan from yesterday.  It shows continued bilateral pulmonary infiltrates.  There is improvement in the right pleural space fluid.  There is some loculated fluid within the major fissure and there is some fluid laterally.  The present chest tube is not in communication with those pockets and he has had 2 intrapleural treatments of TPA.  He has declined surgical intervention and I believe that are nonoperative approaches are limited at this point.  Therefore I will place his chest tube to waterseal.  We will repeat the x-ray this afternoon and if unchanged we will remove the chest tube.  He can continue to be followed clinically and if any further problems arise we can then address them at that point.  Today he has no evidence of an air leak.  There is been minimal drainage from the tube.

## 2020-07-05 NOTE — Progress Notes (Addendum)
MEDICATION RELATED NOTE  Patient Measurements: Height: 5\' 6"  (167.6 cm) Weight: 60.8 kg (134 lb) IBW/kg (Calculated) : 63.8  Vital Signs: Temp: 98.8 F (37.1 C) (09/30 0951) Temp Source: Oral (09/30 0951) BP: 111/69 (09/30 0951) Pulse Rate: 84 (09/30 1402) Intake/Output from previous day: 09/29 0701 - 09/30 0700 In: 390 [P.O.:60; IV Piggyback:330] Out: 1000  Intake/Output from this shift: No intake/output data recorded.  Labs: Recent Labs    07/04/20 0404 07/05/20 0510  WBC 6.9 6.7  HGB 7.6* 7.4*  HCT 23.0* 21.8*  PLT 327 298  CREATININE 11.18* 7.54*  MG  --  1.8   Estimated Creatinine Clearance: 12.3 mL/min (A) (by C-G formula based on SCr of 7.54 mg/dL (H)).   Microbiology: Recent Results (from the past 720 hour(s))  SARS Coronavirus 2 by RT PCR (hospital order, performed in [facility] hospital lab) Nasopharyngeal Nasopharyngeal Swab     Status: None   Collection Time: 06/16/20  8:32 AM   Specimen: Nasopharyngeal Swab  Result Value Ref Range Status   SARS Coronavirus 2 NEGATIVE NEGATIVE Final    Comment: (NOTE) SARS-CoV-2 target nucleic acids are NOT DETECTED.  The SARS-CoV-2 RNA is generally detectable in upper and lower respiratory specimens during the acute phase of infection. The lowest concentration of SARS-CoV-2 viral copies this assay can detect is 250 copies / mL. A negative result does not preclude SARS-CoV-2 infection and should not be used as the sole basis for treatment or other patient management decisions.  A negative result may occur with improper specimen collection / handling, submission of specimen other than nasopharyngeal swab, presence of viral mutation(s) within the areas targeted by this assay, and inadequate number of viral copies (<250 copies / mL). A negative result must be combined with clinical observations, patient history, and epidemiological information.  Fact Sheet for Patients:    StrictlyIdeas.no  Fact Sheet for Healthcare Providers: BankingDealers.co.za  This test is not yet approved or  cleared by the [place] FDA and has been authorized for detection and/or diagnosis of SARS-CoV-2 by FDA under an Emergency Use Authorization (EUA).  This EUA will remain in effect (meaning this test can be used) for the duration of the COVID-19 declaration under Section 564(b)(1) of the Act, 21 U.S.C. section 360bbb-3(b)(1), unless the authorization is terminated or revoked sooner.  Performed at [facility], [address] [place], [place] [address]   Blood Culture (routine x 2)     Status: Abnormal   Collection Time: 06/16/20  8:32 AM   Specimen: BLOOD  Result Value Ref Range Status   Specimen Description   Final    BLOOD LEFT FORE ARM Performed at [facility], [address], [place], [place] [address]    Special Requests   Final    BOTTLES DRAWN AEROBIC AND ANAEROBIC Blood Culture results may not be optimal due to an inadequate volume of blood received in culture bottles Performed at [facility], [address], [place], [place] [address]    Culture  Setup Time   Final    GRAM POSITIVE COCCI IN BOTH AEROBIC AND ANAEROBIC BOTTLES CRITICAL VALUE NOTED.  VALUE IS CONSISTENT WITH PREVIOUSLY REPORTED AND CALLED VALUE. Performed at [facility], [address] [place], [place] [address]    Culture (A)  Final    STREPTOCOCCUS PNEUMONIAE SUSCEPTIBILITIES PERFORMED ON PREVIOUS CULTURE WITHIN THE LAST 5 DAYS. Performed at [facility], [place] [address] [place], [place] [address]    Report Status  06/19/2020 FINAL  Final  Blood Culture (routine x 2)     Status: Abnormal   Collection Time: 06/16/20  8:32 AM   Specimen: BLOOD  Result Value Ref Range Status   Specimen Description   Final    BLOOD LEFT FORE ARM Performed at [facility], [address]  [place], [place] [address]    Special Requests   Final    BOTTLES DRAWN AEROBIC AND ANAEROBIC Blood Culture results may not be optimal due to an inadequate volume of blood received in culture bottles Performed at [facility], [address] [place], [place] [address]    Culture  Setup Time   Final    Organism ID to follow [place] CRITICAL RESULT CALLED TO, READ BACK BY AND VERIFIED WITH: [place] 06/16/20 AT 2148 HS Performed at [facility], [place] [address] [place], [place] [address]    Culture STREPTOCOCCUS PNEUMONIAE (A)  Final   Report Status 06/19/2020 FINAL  Final   Organism ID, Bacteria STREPTOCOCCUS PNEUMONIAE  Final      Susceptibility   Streptococcus pneumoniae - MIC*    ERYTHROMYCIN >=8 RESISTANT Resistant     LEVOFLOXACIN 0.5 SENSITIVE Sensitive     VANCOMYCIN 0.5 SENSITIVE Sensitive     PENICILLIN (meningitis) 1 RESISTANT Resistant     PENO - penicillin 1      PENICILLIN (non-meningitis) 1 SENSITIVE Sensitive     PENICILLIN (oral) 1 INTERMEDIATE Intermediate     CEFTRIAXONE (non-meningitis) 1 SENSITIVE Sensitive     CEFTRIAXONE (meningitis) 1 INTERMEDIATE Intermediate     * STREPTOCOCCUS PNEUMONIAE  Blood Culture ID Panel (Reflexed)     Status: Abnormal   Collection Time: 06/16/20  8:32 AM  Result Value Ref Range Status   Enterococcus faecalis NOT DETECTED NOT DETECTED Final   Enterococcus Faecium NOT DETECTED NOT DETECTED Final   Listeria monocytogenes NOT DETECTED NOT DETECTED Final   Staphylococcus species NOT DETECTED NOT DETECTED Final   Staphylococcus aureus (BCID) NOT DETECTED NOT DETECTED Final   Staphylococcus epidermidis NOT DETECTED NOT DETECTED Final   Staphylococcus lugdunensis NOT DETECTED NOT DETECTED Final   Streptococcus species DETECTED (A) NOT DETECTED Final    Comment: CRITICAL RESULT CALLED TO, READ BACK BY AND VERIFIED WITH: DAVID BESANTI 06/16/20 AT 2148 HS    Streptococcus  agalactiae NOT DETECTED NOT DETECTED Final   Streptococcus pneumoniae DETECTED (A) NOT DETECTED Final    Comment: CRITICAL RESULT CALLED TO, READ BACK BY AND VERIFIED WITH: DAVID BESANTI 06/16/20 AT 2148 HS    Streptococcus pyogenes NOT DETECTED NOT DETECTED Final   A.calcoaceticus-baumannii NOT DETECTED NOT DETECTED Final   Bacteroides fragilis NOT DETECTED NOT DETECTED Final   Enterobacterales NOT DETECTED NOT DETECTED Final   Enterobacter cloacae complex NOT DETECTED NOT DETECTED Final   Escherichia coli NOT DETECTED NOT DETECTED Final   Klebsiella aerogenes NOT DETECTED NOT DETECTED Final   Klebsiella oxytoca NOT DETECTED NOT DETECTED Final   Klebsiella pneumoniae NOT DETECTED NOT DETECTED Final   Proteus species NOT DETECTED NOT DETECTED Final   Salmonella species NOT DETECTED NOT DETECTED Final   Serratia marcescens NOT DETECTED NOT DETECTED Final   Haemophilus influenzae NOT DETECTED NOT DETECTED Final   Neisseria meningitidis NOT DETECTED NOT DETECTED Final   Pseudomonas aeruginosa NOT DETECTED NOT DETECTED Final   Stenotrophomonas maltophilia NOT DETECTED NOT DETECTED Final   Candida albicans NOT DETECTED NOT DETECTED Final   Candida auris  NOT DETECTED NOT DETECTED Final   Candida glabrata NOT DETECTED NOT DETECTED Final   Candida krusei NOT DETECTED NOT DETECTED Final   Candida parapsilosis NOT DETECTED NOT DETECTED Final   Candida tropicalis NOT DETECTED NOT DETECTED Final   Cryptococcus neoformans/gattii NOT DETECTED NOT DETECTED Final    Comment: Performed at [facility], [address] [place], [place] [address]  CULTURE, BLOOD (ROUTINE X 2) w Reflex to ID Panel     Status: None   Collection Time: 06/25/20  7:13 PM   Specimen: BLOOD  Result Value Ref Range Status   Specimen Description BLOOD LEFT ANTECUBITAL  Final   Special Requests   Final    BOTTLES DRAWN AEROBIC AND ANAEROBIC Blood Culture adequate volume   Culture   Final    NO GROWTH 5  DAYS Performed at [facility], [address] [place], [place] [address]    Report Status 06/30/2020 FINAL  Final  CULTURE, BLOOD (ROUTINE X 2) w Reflex to ID Panel     Status: None   Collection Time: 06/25/20  7:27 PM   Specimen: BLOOD  Result Value Ref Range Status   Specimen Description BLOOD BLOOD LEFT HAND  Final   Special Requests   Final    BOTTLES DRAWN AEROBIC AND ANAEROBIC Blood Culture adequate volume   Culture   Final    NO GROWTH 5 DAYS Performed at [facility], [address] [place], [place] [address]    Report Status 06/30/2020 FINAL  Final  MRSA PCR Screening     Status: None   Collection Time: 06/26/20  8:50 PM   Specimen: Nasal Mucosa; Nasopharyngeal  Result Value Ref Range Status   MRSA by PCR NEGATIVE NEGATIVE Final    Comment:        The GeneXpert MRSA Assay (FDA approved for NASAL specimens only), is one component of a comprehensive MRSA colonization surveillance program. It is not intended to diagnose MRSA infection nor to guide or monitor treatment for MRSA infections. Performed at [facility], [address] [place], [place] [address]   Aerobic/Anaerobic Culture (surgical/deep wound)     Status: None   Collection Time: 06/27/20  4:47 PM   Specimen: Pleural Fluid  Result Value Ref Range Status   Specimen Description   Final    PLEURAL Performed at [facility], [address], [place], [place] [address]    Special Requests   Final    PLEURAL FLUID CHEST Performed at [facility], [address] [place], [place] [address]    Gram Stain   Final    RARE WBC PRESENT,BOTH PMN AND MONONUCLEAR NO ORGANISMS SEEN    Culture   Final    No growth aerobically or anaerobically. Performed at [facility], [place] [address] [place], [place] [address]    Report Status 07/02/2020 FINAL  Final    Medical History: Past Medical History:  Diagnosis Date  . Hypertension     Medications:   Scheduled:  . amLODipine  10 mg Oral Daily  . carvedilol  25 mg Oral BID WC  . Chlorhexidine Gluconate Cloth  6 each Topical Q0600  . Darunavir-Cobicisctat-Emtricitabine-Tenofovir Alafenamide  1 tablet Oral Q supper  . epoetin (EPOGEN/PROCRIT) injection  10,000 Units Intravenous Q M,W,F-HD  . feeding supplement (NEPRO CARB STEADY)  237 mL Oral BID BM  . hydrALAZINE  50 mg Oral Q8H  . multivitamin  1 tablet Oral QHS  . sevelamer carbonate  1,600 mg Oral TID WC  . sulfamethoxazole-trimethoprim  1 tablet Oral Daily    Assessment: Patient is here from [place] staying with his mother in [place].  Patient has been set up with outpatient HD in [place]. Due to patient having out of state Medicaid he does not qualify for assistance for Symtuza.    Plan:   A prescription has been called in to [facility] home delivery services:  called to [place], [place] at [phone]  [facility] information on file with [facility]  Rx: Symtuza, #30, 1 tablet once daily with supper, no additional refills  The patient's mobile number on file with [facility] for updates on delivery  Estimated delivery of 07/12/20  Delivery address: [address] [place] [address] (confirmed with patient)  I spoke with the patient and informed him of the above information  I have supplied Mr Ord with an 8 day supply of Symtuza to take until the above supply is delivered  Dallie Piles 07/05/2020,2:40 PM

## 2020-07-05 NOTE — TOC Initial Note (Signed)
Transition of Care [facility]) - Initial/Assessment Note    Patient Details  Name: Tania Perrott MRN: [id number] Date of Birth: [birth year]  [facility]) CM/SW Contact:    Beverly Sessions, RN Phone Number: 07/05/2020, 2:27 PM  Clinical Narrative:                 Patient from [place].  Staying with his mother in [place].  Patient has been set up with outpatient HD in [place].  Patient has limited resources for transportation.  Patient states that his mothers house is on the link transport bus route.  [facility] HD liaison has confirmed the [facility] bus link will take the patient to his outpatient HD. This information has been provided to patient, and bus link pamphlet.   Due to patient having out of state Medicaid   He does not qualify for assistance for Symtuza.  Pharmacist has called a prescription in for the [place] to [place] who approximates delivery to his mom's address here in [place] by 10/7 or sooner.  If patient requires Bactrim at discharge will provide goodrx coupon or attempt to obtain from Medication Management         Patient Goals and CMS Choice        Expected Discharge Plan and Services                                                Prior Living Arrangements/Services                       Activities of Daily Living Home Assistive Devices/Equipment: None ADL Screening (condition at time of admission) Patient's cognitive ability adequate to safely complete daily activities?: Yes Is the patient deaf or have difficulty hearing?: No Does the patient have difficulty seeing, even when wearing glasses/contacts?: No Does the patient have difficulty concentrating, remembering, or making decisions?: No Patient able to express need for assistance with ADLs?: Yes Does the patient have difficulty dressing or bathing?: No Independently performs ADLs?: Yes (appropriate for developmental age) Does the patient have difficulty walking or  climbing stairs?: No Weakness of Legs: None Weakness of Arms/Hands: None  Permission Sought/Granted                  Emotional Assessment              Admission diagnosis:  Hyperkalemia [E87.5] Acute renal insufficiency [N28.9] CAP (community acquired pneumonia) [J18.9] Hypertension, unspecified type [I10] Community acquired pneumonia, unspecified laterality [J18.9] Patient Active Problem List   Diagnosis Date Noted  . Pleural effusion on right 06/26/2020  . Bacteremia due to Streptococcus pneumoniae 06/21/2020  . CAP (community acquired pneumonia) 06/16/2020  . ESRD (end stage renal disease) ([place]) 06/16/2020  . Sepsis ([place]) 06/16/2020  . Hyponatremia 06/16/2020  . Hyperkalemia 06/16/2020  . Elevated troponin 06/16/2020  . Anemia in ESRD (end-stage renal disease) ([place]) 06/16/2020  . Hypertension    PCP:  Pcp, No Pharmacy:  No Pharmacies Listed    Social Determinants of Health (SDOH) Interventions    Readmission Risk Interventions No flowsheet data found.

## 2020-07-05 NOTE — Progress Notes (Signed)
ID Pt is waiting to go home Cough loosening  No chest tightness No fever Patient Vitals for the past 24 hrs:  BP Temp Temp src Pulse Resp SpO2  07/05/20 1402 -- -- -- 84 -- 99 %  07/05/20 0951 111/69 98.8 F (37.1 C) Oral 96 -- 98 %  07/05/20 0625 -- 99.3 F (37.4 C) Oral -- -- --  07/05/20 0412 120/81 99.3 F (37.4 C) Oral 97 20 97 %  07/04/20 1955 113/74 98.9 F (37.2 C) Oral 93 20 95 %  07/04/20 1608 -- -- -- -- (!) 21 --  07/04/20 1600 125/88 -- -- -- 18 --  07/04/20 1545 128/88 -- -- -- 18 --  07/04/20 1530 126/90 -- -- -- 17 --  07/04/20 1515 129/88 -- -- -- 17 --  07/04/20 1500 121/86 -- -- -- 18 --  07/04/20 1445 124/81 -- -- -- 16 --   Awake and alert Flat affect Chest b/l air entry Crepts+ HS s1s2 Decreased bases Rt chest drain CNS non focal  labs  CBC Latest Ref Rng & Units 07/05/2020 07/04/2020 06/30/2020  WBC 4.0 - 10.5 K/uL 6.7 6.9 7.6  Hemoglobin 13.0 - 17.0 g/dL 7.4(L) 7.6(L) 7.9(L)  Hematocrit 39 - 52 % 21.8(L) 23.0(L) 23.8(L)  Platelets 150 - 400 K/uL 298 327 353    CMP Latest Ref Rng & Units 07/05/2020 07/04/2020 07/02/2020  Glucose 70 - 99 mg/dL 90 83 85  BUN 6 - 20 mg/dL 29(H) 47(H) 60(H)  Creatinine 0.61 - 1.24 mg/dL 7.54(H) 11.18(H) 12.99(H)  Sodium 135 - 145 mmol/L 134(L) 131(L) 132(L)  Potassium 3.5 - 5.1 mmol/L 4.3 5.2(H) 5.2(H)  Chloride 98 - 111 mmol/L 94(L) 92(L) 93(L)  CO2 22 - 32 mmol/L 29 26 25   Calcium 8.9 - 10.3 mg/dL 8.4(L) 8.7(L) 8.7(L)  Total Protein 6.5 - 8.1 g/dL - - -  Total Bilirubin 0.3 - 1.2 mg/dL - - -  Alkaline Phos 38 - 126 U/L - - -  AST 15 - 41 U/L - - -  ALT 0 - 44 U/L - - -       Impression/recommendation Strep pneumo bacteremia and pneumonia b/l on ceftriaxone -day19  Rt empyema- s/p chest drain- repeat Ct small loculated fluid in minor fissure and some loculated pleural effusion- pt does not want any further surgeries. He had gotten TPA thru tube Plan is to remove drain tomorrow Change ceftriaxone to Po  levaquin for another 8 days  AIDS- was non compliant-followed in [place] -was taking triumeq- cd4 was 74  and Vl 15K. Because of ESRD and now on dialysis changed to [place] was done on 06/22/20 and no resistance to the classes of HAART An integrase inhibitor regimen is an option in the future if there is concern for drug interactions with Cobicistat in symtuza Currently on bactrim for PCP prophylaxis- if Cd4 > 200 it can be discontinued  Accelerated HTN -management as per primary team  On   RPR 1:1 but TPA neg - so this is false positive and he does not have syphilis   ESRD -started hemodialysis this admission Discussed the management with the patient As long as patient in [place] will follow him as OP Will give him a follow up  appt in 3 weeks

## 2020-07-05 NOTE — Consult Note (Signed)
Pharmacy Antibiotic Note  Daniel Combs is a 29 y.o. male admitted on 06/16/2020 with CAP/empyema.  Pharmacy has been consulted for Levofloxacin dosing. Patient has a PMH significant for ESRD and started HD during this admission.   Patient was previously on Ceftriaxone  Plan: Levofloxacin IV 750 mg x1 dose, followed by 500 mg Q48H. Per Dr. Gwenevere Ghazi note, patient will be on levofloxacin for 8 days.   Height: 5\' 6"  (167.6 cm) Weight: 60.8 kg (134 lb) IBW/kg (Calculated) : 63.8  Temp (24hrs), Avg:98.9 F (37.2 C), Min:98 F (36.7 C), Max:99.3 F (37.4 C)  Recent Labs  Lab 06/29/20 0434 06/29/20 0434 06/29/20 0857 06/30/20 0544 07/01/20 0351 07/02/20 0455 07/04/20 0404 07/05/20 0510  WBC 10.0  --  10.2 7.6  --   --  6.9 6.7  CREATININE 10.64*   < >  --  8.04* 10.64* 12.99* 11.18* 7.54*   < > = values in this interval not displayed.    Estimated Creatinine Clearance: 12.3 mL/min (A) (by C-G formula based on SCr of 7.54 mg/dL (H)).    No Known Allergies  Antimicrobials this admission: 9/30 Levofloxacin  Dose adjustments this admission:   Microbiology results: BCx:  UCx:  Sputum:   MRSA PCR:   Thank you for allowing pharmacy to be a part of this patient's care.  Rowland Lathe 07/05/2020 5:56 PM

## 2020-07-05 NOTE — Progress Notes (Signed)
Patient also refused CHG bath.  Daniel Combs

## 2020-07-05 NOTE — Progress Notes (Signed)
PROGRESS NOTE    Daniel Combs  QMG:[id number] DOB: 1990/10/12 DOA: 06/16/2020 PCP: Pcp, No    Brief Narrative:  29 year old male with history of untreated HIV, hypertension, untreated chronic kidney disease progressing to ESRD who was initially admitted on 06/16/2020 with cough and back pain of 1 week duration.  Has not been adherent to antihypertensive or HIV treatment.  Blood cultures found to be positive for Streptococcus pneumonia.  Associated large right-sided empyema.  Infectious disease, nephrology, cardiothoracic surgery, interventional radiology on consult.  Initially met for sepsis secondary to community-acquired pneumonia.  Completed 10 days of IV Rocephin.  This is when the repeat chest CT showed loculated right pleural effusion.  Antibiotics expanded.  Nephrology consulted for ESRD.  Vascular surgery placed tunneled hemodialysis catheter 06/18/2020.  New start hemodialysis this admission.  Patient status post placement of an image guided right-sided pigtail catheter 06/27/2020.  Cardiothoracic surgery following for intrapleural TPA instillation.  Patient reported some back pain at the chest tube insertion site after first TPA instillation.  Reports his breathing is actually little better.  Infiltrates improving however chest x-rays continue to show loculated pleural effusions despite intrapleural TPA instillation x2.  The patient was seen by cardiothoracic surgery this morning however seem to be unwilling to listen.  The patient is unwilling to discuss the possibility of repeat percutaneous drainage versus surgical intervention.  Cardiothoracic surgery continue to follow however patient is unwilling to listen to their recommendations are options may be limited.   Assessment & Plan:   Principal Problem:   Sepsis ([place]) Active Problems:   CAP (community acquired pneumonia)   Hypertension   ESRD (end stage renal disease) ([place])   Hyponatremia   Hyperkalemia   Elevated troponin    Anemia in ESRD (end-stage renal disease) ([place])   Bacteremia due to Streptococcus pneumoniae   Pleural effusion on right  Sepsis secondary to Streptococcus pneumonia bacteremia Complex loculated right pleural effusion secondary to above Status post right-sided pigtail catheter 06/27/2020 CTS following, status post pleural TPA instillation 06/28/2020 Repeat TPA instillation 2020-07-02 On broad-spectrum IV antibiotics, ID following Effusions improving however still demonstrating loculations despite TPA instillation x2.   Plan: --chest tube on water seal for planned removal --switch abx from ceftriaxone to IV levaquin in preparation for discharge, per ID rec --Pt does not want surgical intervention  AIDS Patient has been nonadherent to his HIV treatment started on Symtuza during this admission and Bactrim for P JP prophylaxis Infectious disease following PLAN: --continue Symtuza and Bactrim --Meds ordered to [place] and to be shipped to [place] around 10/7 --TOC and pharm helping to figure out how to order his HIV meds for the gap  Accelerated hypertension --started on amlodipine, coreg, hydralazine --continue current BP regimen  End-stage renal disease on hemodialysis New HD start this admission Outpatient HD chair found [place] Monday Wednesday Friday 6:30 AM PLAN: --continue iHD per nephrology --continue phos binder  Anemia of chronic disease and iron def --anemia workup showed iron def --Epo with dialysis --discharge on iron supplement   DVT prophylaxis: Heparin SQ Code Status: Full code  Family Communication:  Status is: inpatient Dispo:   The patient is from: home Anticipated d/c is to: home Anticipated d/c date is: tomorrow Patient currently is not medically stable to d/c due to: need to remove chest tube and order interim HIV meds.   Consultants:   Nephrology  Infectious disease Cardiothoracic surgery Interventional radiology  Procedures:     Right-sided chest tube, 06/27/2020  Antimicrobials:  Vancomycin  Cefepime   Subjective: Pt again wanted to go home.  Chest tube on water seal for planned removal.   Objective: Vitals:   07/05/20 0951 07/05/20 1402 07/05/20 1608 07/05/20 2043  BP: 111/69  120/71 (!) 108/54  Pulse: 96 84 88 92  Resp:   16 14  Temp: 98.8 F (37.1 C)  98 F (36.7 C) 98.4 F (36.9 C)  TempSrc: Oral  Oral Oral  SpO2: 98% 99% 98% 97%  Weight:      Height:        Intake/Output Summary (Last 24 hours) at 07/06/2020 0421 Last data filed at 07/05/2020 1854 Gross per 24 hour  Intake 120 ml  Output --  Net 120 ml   Filed Weights   06/16/20 2228  Weight: 60.8 kg    Examination:  Constitutional: NAD, AAOx3 HEENT: conjunctivae and lids normal, EOMI CV: No cyanosis.   RESP: normal respiratory effort, on RA, chest tube present SKIN: warm, dry and intact Neuro: II - XII grossly intact.   Psych: irritated mood and affect.     Data Reviewed: I have personally reviewed following labs and imaging studies  CBC: Recent Labs  Lab 06/29/20 0434 06/29/20 0857 06/30/20 0544 07/04/20 0404 07/05/20 0510  WBC 10.0 10.2 7.6 6.9 6.7  NEUTROABS 7.2  --  5.4 4.4  --   HGB 7.7* 8.0* 7.9* 7.6* 7.4*  HCT 23.6* 23.9* 23.8* 23.0* 21.8*  MCV 86.1 83.0 83.8 82.7 82.9  PLT 440* 457* 353 327 409   Basic Metabolic Panel: Recent Labs  Lab 06/30/20 0544 07/01/20 0351 07/02/20 0455 07/04/20 0404 07/05/20 0510  NA 131* 131* 132* 131* 134*  K 4.8 4.6 5.2* 5.2* 4.3  CL 95* 92* 93* 92* 94*  CO2 '25 24 25 26 29  ' GLUCOSE 105* 86 85 83 90  BUN 35* 47* 60* 47* 29*  CREATININE 8.04* 10.64* 12.99* 11.18* 7.54*  CALCIUM 8.4* 8.6* 8.7* 8.7* 8.4*  MG  --   --   --   --  1.8   GFR: Estimated Creatinine Clearance: 12.3 mL/min (A) (by C-G formula based on SCr of 7.54 mg/dL (H)). Liver Function Tests: No results for input(s): AST, ALT, ALKPHOS, BILITOT, PROT, ALBUMIN in the last 168 hours. No results for  input(s): LIPASE, AMYLASE in the last 168 hours. No results for input(s): AMMONIA in the last 168 hours. Coagulation Profile: No results for input(s): INR, PROTIME in the last 168 hours. Cardiac Enzymes: No results for input(s): CKTOTAL, CKMB, CKMBINDEX, TROPONINI in the last 168 hours. BNP (last 3 results) No results for input(s): PROBNP in the last 8760 hours. HbA1C: No results for input(s): HGBA1C in the last 72 hours. CBG: No results for input(s): GLUCAP in the last 168 hours. Lipid Profile: No results for input(s): CHOL, HDL, LDLCALC, TRIG, CHOLHDL, LDLDIRECT in the last 72 hours. Thyroid Function Tests: No results for input(s): TSH, T4TOTAL, FREET4, T3FREE, THYROIDAB in the last 72 hours. Anemia Panel: No results for input(s): VITAMINB12, FOLATE, FERRITIN, TIBC, IRON, RETICCTPCT in the last 72 hours. Sepsis Labs: No results for input(s): PROCALCITON, LATICACIDVEN in the last 168 hours.  Recent Results (from the past 240 hour(s))  MRSA PCR Screening     Status: None   Collection Time: 06/26/20  8:50 PM   Specimen: Nasal Mucosa; Nasopharyngeal  Result Value Ref Range Status   MRSA by PCR NEGATIVE NEGATIVE Final    Comment:        The GeneXpert MRSA Assay (FDA approved  for NASAL specimens only), is one component of a comprehensive MRSA colonization surveillance program. It is not intended to diagnose MRSA infection nor to guide or monitor treatment for MRSA infections. Performed at [facility], [address] [place], [place] [address]   Aerobic/Anaerobic Culture (surgical/deep wound)     Status: None   Collection Time: 06/27/20  4:47 PM   Specimen: Pleural Fluid  Result Value Ref Range Status   Specimen Description   Final    PLEURAL Performed at [facility], [address] [place], [place] [address]    Special Requests   Final    PLEURAL FLUID CHEST Performed at [facility], [address] [place], [place] [address]    Gram  Stain   Final    RARE WBC PRESENT,BOTH PMN AND MONONUCLEAR NO ORGANISMS SEEN    Culture   Final    No growth aerobically or anaerobically. Performed at [facility], [place] [address], [place], [place] [address]    Report Status 07/02/2020 FINAL  Final         Radiology Studies: CT CHEST WO CONTRAST  Result Date: 07/04/2020 CLINICAL DATA:  Pneumonia, effusion. EXAM: CT CHEST WITHOUT CONTRAST TECHNIQUE: Multidetector CT imaging of the chest was performed following the standard protocol without IV contrast. COMPARISON:  CT chest 06/25/2020 FINDINGS: Cardiovascular: Right IJ central venous catheter with the tip in the right atrium. Similar cardiomegaly. No evidence of a pericardial effusion. Mediastinum/Nodes: Limited evaluation without IV contrast without evidence of adenopathy. Lungs/Pleura: Slightly improved, but persistent consolidation and ground-glass opacification within the left upper lobe. Slightly improved ground-glass opacities in the right upper lobe. No pneumothorax. There is a pigtail drain within the right posterior pleural space with associated decrease in size of a now small right pleural effusion. There is fluid tracking along the right major fissure. There is also loculated fluid extending more superiorly. New small to moderate left pleural effusion with overlying atelectasis. Upper Abdomen: Hyperdensity within the gallbladder, possibly sludge. No evidence of acute abnormality in the visualized upper abdomen. Musculoskeletal: No acute osseous abnormality. IMPRESSION: 1. Slightly improved, but persistent multifocal consolidative and ground-glass opacities compatible with multifocal pneumonia. 2. Decrased size of a now small loculated right pleural effusion after placement of a pleural drain, as above. 3. New small to moderate left pleural effusion. Electronically Signed   By: Margaretha Sheffield MD   On: 07/04/2020 20:35   DG Chest Port 1 View  Result Date: 07/05/2020 CLINICAL  DATA:  Follow-up chest tube EXAM: PORTABLE CHEST 1 VIEW COMPARISON:  07/04/2020 FINDINGS: Cardiac shadow is enlarged but stable. Dialysis catheter is again noted and stable. Bilateral effusions are noted with a pigtail catheter seen in the right lung base. No pneumothorax is noted. Loculated fluid along the right apex laterally is noted. Patchy airspace opacity is noted particularly in the inferior aspect of the left upper lobe similar to that seen on prior CT. IMPRESSION: No significant interval change from the prior exam. Electronically Signed   By: Inez Catalina M.D.   On: 07/05/2020 14:19        Scheduled Meds: . amLODipine  10 mg Oral Daily  . carvedilol  25 mg Oral BID WC  . Chlorhexidine Gluconate Cloth  6 each Topical Q0600  . Darunavir-Cobicisctat-Emtricitabine-Tenofovir Alafenamide  1 tablet Oral Q supper  . epoetin (EPOGEN/PROCRIT) injection  10,000 Units Intravenous Q M,W,F-HD  . feeding supplement (NEPRO CARB STEADY)  237 mL Oral BID BM  . hydrALAZINE  50 mg Oral Q8H  . multivitamin  1 tablet Oral QHS  . sevelamer carbonate  1,600 mg Oral TID WC  . sulfamethoxazole-trimethoprim  1 tablet Oral Daily   Continuous Infusions: . sodium chloride 250 mL (07/01/20 2118)  . [START ON 07/07/2020] levofloxacin (LEVAQUIN) IV       LOS: 20 days     Enzo Bi, MD [facility] Pager [phone]  If 7PM-7AM, please contact night-coverage 07/06/2020, 4:21 AM

## 2020-07-06 LAB — MAGNESIUM: Magnesium: 2.1 mg/dL (ref 1.7–2.4)

## 2020-07-06 LAB — CBC
HCT: 25.7 % — ABNORMAL LOW (ref 39.0–52.0)
Hemoglobin: 8 g/dL — ABNORMAL LOW (ref 13.0–17.0)
MCH: 27.2 pg (ref 26.0–34.0)
MCHC: 31.1 g/dL (ref 30.0–36.0)
MCV: 87.4 fL (ref 80.0–100.0)
Platelets: 356 10*3/uL (ref 150–400)
RBC: 2.94 MIL/uL — ABNORMAL LOW (ref 4.22–5.81)
RDW: 15 % (ref 11.5–15.5)
WBC: 8.1 10*3/uL (ref 4.0–10.5)
nRBC: 0 % (ref 0.0–0.2)

## 2020-07-06 LAB — BASIC METABOLIC PANEL
Anion gap: 14 (ref 5–15)
BUN: 43 mg/dL — ABNORMAL HIGH (ref 6–20)
CO2: 26 mmol/L (ref 22–32)
Calcium: 8.7 mg/dL — ABNORMAL LOW (ref 8.9–10.3)
Chloride: 91 mmol/L — ABNORMAL LOW (ref 98–111)
Creatinine, Ser: 9.88 mg/dL — ABNORMAL HIGH (ref 0.61–1.24)
GFR calc Af Amer: 7 mL/min — ABNORMAL LOW (ref >60)
GFR calc non Af Amer: 6 mL/min — ABNORMAL LOW (ref >60)
Glucose, Bld: 81 mg/dL (ref 70–99)
Potassium: 4.7 mmol/L (ref 3.5–5.1)
Sodium: 131 mmol/L — ABNORMAL LOW (ref 135–145)

## 2020-07-06 MED ORDER — AMLODIPINE BESYLATE 10 MG PO TABS: 10.0000 mg | ORAL_TABLET | Freq: Every day | ORAL | 2 refills | Status: AC

## 2020-07-06 MED ORDER — OSCAL 500/200 D-3 500-200 MG-UNIT PO TABS: 1.0000 | ORAL_TABLET | Freq: Three times a day (TID) | ORAL | 2 refills | Status: AC

## 2020-07-06 MED ORDER — DARUN-COBIC-EMTRICIT-TENOFAF 800-150-200-10 MG PO TABS: 1.0000 | ORAL_TABLET | Freq: Every day | ORAL | Status: AC

## 2020-07-06 MED ORDER — SULFAMETHOXAZOLE-TRIMETHOPRIM 400-80 MG PO TABS: 1.0000 | ORAL_TABLET | Freq: Every day | ORAL | 2 refills | Status: DC

## 2020-07-06 MED ORDER — SEVELAMER CARBONATE 800 MG PO TABS: 1600.0000 mg | ORAL_TABLET | Freq: Three times a day (TID) | ORAL | 2 refills | Status: DC

## 2020-07-06 MED ORDER — RENA-VITE PO TABS: 1.0000 | ORAL_TABLET | Freq: Every day | ORAL | 0 refills | Status: AC

## 2020-07-06 MED ORDER — LEVOFLOXACIN 500 MG PO TABS: 500.0000 mg | ORAL_TABLET | ORAL | 0 refills | Status: DC

## 2020-07-06 MED ORDER — CARVEDILOL 25 MG PO TABS: 25.0000 mg | ORAL_TABLET | Freq: Two times a day (BID) | ORAL | 2 refills | Status: AC

## 2020-07-06 MED ORDER — LEVOFLOXACIN 500 MG PO TABS: 500.0000 mg | ORAL_TABLET | ORAL | Status: DC

## 2020-07-06 MED ORDER — EPOETIN ALFA 10000 UNIT/ML IJ SOLN
10000.0000 [IU] | INTRAMUSCULAR | Status: AC
Start: 2020-07-06 — End: ?

## 2020-07-06 MED ORDER — IRON 18 MG PO TBCR: 1.0000 | EXTENDED_RELEASE_TABLET | Freq: Every day | ORAL | 2 refills | Status: DC

## 2020-07-06 MED ORDER — HYDRALAZINE HCL 50 MG PO TABS: 50.0000 mg | ORAL_TABLET | Freq: Three times a day (TID) | ORAL | 2 refills | Status: DC

## 2020-07-06 MED ORDER — HYDROMORPHONE HCL 1 MG/ML IJ SOLN
1.0000 mg | Freq: Once | INTRAMUSCULAR | Status: AC
  Administered 2020-07-06: 1 mg via INTRAVENOUS
  Filled 2020-07-06: qty 1

## 2020-07-06 MED ORDER — LEVOFLOXACIN 500 MG PO TABS: 500.0000 mg | ORAL_TABLET | ORAL | 0 refills | Status: AC

## 2020-07-06 NOTE — Progress Notes (Signed)
[facility], [place] 07/06/20  Subjective:   LOS: 20  Patient seen during dialysis Tolerating well    HEMODIALYSIS FLOWSHEET:  Blood Flow Rate (mL/min): 200 mL/min Arterial Pressure (mmHg): -170 mmHg Venous Pressure (mmHg): 250 mmHg Transmembrane Pressure (mmHg): 60 mmHg Ultrafiltration Rate (mL/min): 340 mL/min Dialysate Flow Rate (mL/min): 600 ml/min Conductivity: Machine : 14.1 Conductivity: Machine : 14.1 Dialysis Fluid Bolus: Normal Saline Bolus Amount (mL): 250 mL    Objective:  Vital signs in last 24 hours:  Temp:  [98 F (36.7 C)-98.4 F (36.9 C)] 98.3 F (36.8 C) (10/01 1820) Pulse Rate:  [86-99] 99 (10/01 1820) Resp:  [14-20] 20 (10/01 1820) BP: (108-140)/(54-98) 140/98 (10/01 1820) SpO2:  [94 %-98 %] 98 % (10/01 1820)  Weight change:  Filed Weights   06/16/20 2228  Weight: 60.8 kg    Intake/Output:    Intake/Output Summary (Last 24 hours) at 07/06/2020 1900 Last data filed at 07/06/2020 1737 Gross per 24 hour  Intake --  Output 363 ml  Net -363 ml    Physical Exam: General:  In no acute distress  HEENT  Normocephalic,atraumatic  Pulm/lungs   Normal and symmetrical respiratory effort, lungs clear  CVS/Heart  regular  Abdomen:   Soft, non tender,non distended  Extremities:  No peripheral edema  Neurologic:  Alert, oriented x3  Skin:  No acute rashes or lesions  Right IJ PermCath     Basic Metabolic Panel:  Recent Labs  Lab 07/01/20 0351 07/01/20 0351 07/02/20 0455 07/02/20 0455 07/04/20 0404 07/05/20 0510 07/06/20 0433  NA 131*  --  132*  --  131* 134* 131*  K 4.6  --  5.2*  --  5.2* 4.3 4.7  CL 92*  --  93*  --  92* 94* 91*  CO2 24  --  25  --  26 29 26   GLUCOSE 86  --  85  --  83 90 81  BUN 47*  --  60*  --  47* 29* 43*  CREATININE 10.64*  --  12.99*  --  11.18* 7.54* 9.88*  CALCIUM 8.6*   < > 8.7*   < > 8.7* 8.4* 8.7*  MG  --   --   --   --   --  1.8 2.1   < > = values in this interval not  displayed.     CBC: Recent Labs  Lab 06/30/20 0544 07/04/20 0404 07/05/20 0510 07/06/20 0433  WBC 7.6 6.9 6.7 8.1  NEUTROABS 5.4 4.4  --   --   HGB 7.9* 7.6* 7.4* 8.0*  HCT 23.8* 23.0* 21.8* 25.7*  MCV 83.8 82.7 82.9 87.4  PLT 353 327 298 356      Lab Results  Component Value Date   HEPBSAG NON REACTIVE 06/17/2020   HEPBIGM NON REACTIVE 06/17/2020      Microbiology:  Recent Results (from the past 240 hour(s))  MRSA PCR Screening     Status: None   Collection Time: 06/26/20  8:50 PM   Specimen: Nasal Mucosa; Nasopharyngeal  Result Value Ref Range Status   MRSA by PCR NEGATIVE NEGATIVE Final    Comment:        The GeneXpert MRSA Assay (FDA approved for NASAL specimens only), is one component of a comprehensive MRSA colonization surveillance program. It is not intended to diagnose MRSA infection nor to guide or monitor treatment for MRSA infections. Performed at [facility], [address] [place], [place] [address]   Aerobic/Anaerobic  Culture (surgical/deep wound)     Status: None   Collection Time: 06/27/20  4:47 PM   Specimen: Pleural Fluid  Result Value Ref Range Status   Specimen Description   Final    PLEURAL Performed at [facility], [address] [place], [place] [address]    Special Requests   Final    PLEURAL FLUID CHEST Performed at [facility], [address] [place], [place] [address]    Gram Stain   Final    RARE WBC PRESENT,BOTH PMN AND MONONUCLEAR NO ORGANISMS SEEN    Culture   Final    No growth aerobically or anaerobically. Performed at [facility], [place] [address], [place], [place] [address]    Report Status 07/02/2020 FINAL  Final    Coagulation Studies: No results for input(s): LABPROT, INR in the last 72 hours.  Urinalysis: No results for input(s): COLORURINE, LABSPEC, PHURINE, GLUCOSEU, HGBUR, BILIRUBINUR, KETONESUR, PROTEINUR, UROBILINOGEN, NITRITE, LEUKOCYTESUR in the last 72  hours.  Invalid input(s): APPERANCEUR    Imaging: DG Chest Port 1 View  Result Date: 07/05/2020 CLINICAL DATA:  Follow-up chest tube EXAM: PORTABLE CHEST 1 VIEW COMPARISON:  07/04/2020 FINDINGS: Cardiac shadow is enlarged but stable. Dialysis catheter is again noted and stable. Bilateral effusions are noted with a pigtail catheter seen in the right lung base. No pneumothorax is noted. Loculated fluid along the right apex laterally is noted. Patchy airspace opacity is noted particularly in the inferior aspect of the left upper lobe similar to that seen on prior CT. IMPRESSION: No significant interval change from the prior exam. Electronically Signed   By: Inez Catalina M.D.   On: 07/05/2020 14:19     Medications:   . sodium chloride 250 mL (07/01/20 2118)   . amLODipine  10 mg Oral Daily  . carvedilol  25 mg Oral BID WC  . Chlorhexidine Gluconate Cloth  6 each Topical Q0600  . Darunavir-Cobicisctat-Emtricitabine-Tenofovir Alafenamide  1 tablet Oral Q supper  . epoetin (EPOGEN/PROCRIT) injection  10,000 Units Intravenous Q M,W,F-HD  . feeding supplement (NEPRO CARB STEADY)  237 mL Oral BID BM  . hydrALAZINE  50 mg Oral Q8H  . [START ON 07/07/2020] levofloxacin  500 mg Oral Q48H  . multivitamin  1 tablet Oral QHS  . sevelamer carbonate  1,600 mg Oral TID WC  . sulfamethoxazole-trimethoprim  1 tablet Oral Daily   sodium chloride, acetaminophen, chlorpheniramine-HYDROcodone, guaiFENesin, hydrALAZINE, labetalol, ondansetron (ZOFRAN) IV, oxyCODONE-acetaminophen, promethazine  Assessment/ Plan:  29 y.o. male with longstanding hypertension, chronic kidney disease, anemia of chronic kidney disease, secondary hyperparathyroidism,HIV  was admitted on 06/16/2020 for  Principal Problem:   Sepsis ([place]) Active Problems:   CAP (community acquired pneumonia)   Hypertension   ESRD (end stage renal disease) ([place])   Hyponatremia   Hyperkalemia   Elevated troponin   Anemia in ESRD (end-stage renal  disease) ([place])   Bacteremia due to Streptococcus pneumoniae   Pleural effusion on right  Hyperkalemia [E87.5] Acute renal insufficiency [N28.9] CAP (community acquired pneumonia) [J18.9] Hypertension, unspecified type [I10] Community acquired pneumonia, unspecified laterality [J18.9]  #. ESRD Unclear underlying cause but most likely longstanding hypertension Other differential includes HIV-associated nephropathy as patient had untreated HIV  Will continue MWF schedule for dialysis  [facility] dialysis set up in  [facility]. Can start monday  #. Anemia of CKD  Lab Results  Component Value Date   HGB 8.0 (L) 07/06/2020   Continue Epogen with dialysis  #. Secondary  hyperparathyroidism of renal origin N 25.81      Component Value Date/Time   PTH 654 (H) 06/17/2020 2250   Lab Results  Component Value Date   PHOS 6.2 (H) 06/27/2020   Patient is on Phosphate binder Renvela with meals   #.  Sepsis Secondary to streptococcal pneumonia bacteremia, complex loculated right pleural effusion Underwent TPA instillation on September 23 Followed by general surgery and thoracic surgery       LOS: [place] 10/1/20217:00 PM  [place], [place]

## 2020-07-06 NOTE — TOC Transition Note (Signed)
Transition of Care [facility]) - CM/SW Discharge Note   Patient Details  Name: Daniel Combs MRN: [id number] Date of Birth: [birth year]  [facility] CM/SW Contact:  Shade Flood, LCSW Phone Number: 07/06/2020, 2:25 PM   Clinical Narrative:     Pt stable for dc today per MD. Confirmed with Elvera Bicker that pt's outpatient HD arrangements are in place. Per Estill Bamberg, pt has been given all of the information/instructions in writing that he needs to follow up with HD as an outpatient.   DC medications provided through [facility]. Pt has informed TOC that he will be staying with his mother for one month. He is not eligible for any local PCP follow up based on his insurance coverage being through [place]. Pt is scheduled to follow up with ID in 3 weeks. Pt can present to the [facility] clinic for any other follow up needs. This has been added to pt's AVS.  There are no other TOC needs for dc.  Final next level of care: Home/Self Care Barriers to Discharge: Barriers Resolved   Patient Goals and CMS Choice        Discharge Placement                       Discharge Plan and Services   Discharge Planning Services: Medication Assistance                                 Social Determinants of Health (SDOH) Interventions     Readmission Risk Interventions No flowsheet data found.

## 2020-07-06 NOTE — Discharge Summary (Signed)
Physician Discharge Summary   Daniel Combs  male DOB: [birth year]  NKN:[id number]  PCP: Pcp, No  Admit date: 06/16/2020 Discharge date: 07/06/2020  Admitted From: home Disposition:  Home (to mother's home) Prior to discharge, all medication ordered and delivered to the pt.  CODE STATUS: Full code  Discharge Instructions    Discharge instructions   Complete by: As directed    We have provided you with your HIV medication, Symtuza, about 10 days worth, to go home with.  You should receive further prescription from [facility].    We have started you on many medications, which have all been prescribed to [facility].  Please take them as directed.  If you are still in [place] in the next month, please follow up with ID doctor in outpatient clinic in 3 weeks.  - -   No wound care   Complete by: As directed        Hospital Course:  For full details, please see H&P, progress notes, consult notes and ancillary notes.  Briefly,  Daniel Combs is a 29 year old male with history of untreated HIV due to medication non-compliance, hypertension, untreated chronic kidney disease progressing to ESRD who was initially admitted on 06/16/2020 with cough and back pain of 1 week duration.    # Sepsis secondary to Streptococcus pneumonia bacteremia # Bilateral PNA # Complex loculated right pleural effusion secondary to above Initially met for sepsis secondary to community-acquired pneumonia.  Completed 10 days of IV Rocephin.  Then repeat chest CT showed loculated right pleural effusion.  Antibiotics expanded.   CT surgery consulted.  Pt received right-sided pigtail catheter 06/27/2020, and pleural TPA instillation 06/28/2020 and repeat TPA instillation 2020-07-02.  Infiltrates improving however chest x-rays continued to show loculated pleural effusions despite intrapleural TPA instillation x2.  The patient was unwilling to discuss the possibility of repeat percutaneous drainage  versus surgical intervention.  Options therefore were limited.  Pt also just wanted the chest tube out so he could go home.  Chest tube removed on the day of discharge.  Pt received 19 days of ceftriaxone and was transitioned to Levaquin prior to discharge.  Pt will continue Levaquin every other day for another 8 days after discharge, per ID rec.    AIDS Patient was followed in [place] and had been nonadherent to his HIV treatment.  Pt was started on Symtuza during this admission and Bactrim for PCP prophylaxis.  Symtuza ordered to [place] and to be shipped to [place] around 10/7.  Inpatient pharm secrued 10 days of Symtuza for pt at discharge to cover the gap.  Pt will follow up with ID as outpatient in 3 weeks on 07/24/20.  Accelerated hypertension started on amlodipine, coreg, hydralazine.    End-stage renal disease on hemodialysis New HD start this admission.   Vascular surgery placed tunneled hemodialysis catheter 06/18/2020.  Outpatient HD chair found [facility] Monday Wednesday Friday 6:30 AM.    Anemia of chronic disease and iron def anemia workup showed iron def.  Pt discharged on iron supplement.  Epo with dialysis.   Discharge Diagnoses:  Principal Problem:   Sepsis ([place]) Active Problems:   CAP (community acquired pneumonia)   Hypertension   ESRD (end stage renal disease) ([place])   Hyponatremia   Hyperkalemia   Elevated troponin   Anemia in ESRD (end-stage renal disease) ([place])   Bacteremia due to Streptococcus pneumoniae   Pleural effusion on right    Discharge Instructions:  Allergies as of  07/06/2020   No Known Allergies     Medication List    STOP taking these medications   losartan 100 MG tablet Commonly known as: COZAAR     TAKE these medications   amLODipine 10 MG tablet Commonly known as: NORVASC Take 1 tablet (10 mg total) by mouth daily.   carvedilol 25 MG tablet Commonly known as: COREG Take 1 tablet (25 mg total) by mouth 2 (two) times  daily with a meal.   Darunavir-Cobicisctat-Emtricitabine-Tenofovir Alafenamide 800-150-200-10 MG Tabs Commonly known as: SYMTUZA Take 1 tablet by mouth daily with supper.   epoetin alfa 10000 UNIT/ML injection Commonly known as: EPOGEN Inject 1 mL (10,000 Units total) into the vein every Monday, Wednesday, and Friday with hemodialysis.   hydrALAZINE 50 MG tablet Commonly known as: APRESOLINE Take 1 tablet (50 mg total) by mouth every 8 (eight) hours.   Iron 18 MG Tbcr Take 1 tablet (18 mg total) by mouth daily. Can give any formulation of iron supplement.   levofloxacin 500 MG tablet Commonly known as: LEVAQUIN Take 1 tablet (500 mg total) by mouth every other day for 5 doses. Antibiotic. Start taking on: July 07, 2020   multivitamin Tabs tablet Take 1 tablet by mouth at bedtime.   Oscal 500/200 D-3 500-200 MG-UNIT tablet Generic drug: calcium-vitamin D Take 1 tablet by mouth 3 (three) times daily.   sulfamethoxazole-trimethoprim 400-80 MG tablet Commonly known as: BACTRIM Take 1 tablet by mouth daily.        Follow-up Information    Tsosie Billing, MD. Schedule an appointment as soon as possible for a visit in 3 weeks.   Specialty: Infectious Diseases Why: left a message they will contact the patient Contact information: [place] [address] [phone]        Nestor Lewandowsky, MD. Go on 07/20/2020.   Specialties: Cardiothoracic Surgery, General Surgery Why: 8:30am appointment Contact information: [address] [place] [address] [phone]        [place] Follow up.   Why: Follow up at the [facility] clinic if you have follow up needs prior to your appointment with Dr. Delaine Lame. Contact information: [place] [address] [phone]               No Known Allergies   The results of significant diagnostics from this hospitalization (including  imaging, microbiology, ancillary and laboratory) are listed below for reference.   Consultations:   Procedures/Studies: DG Chest 2 View  Result Date: 07/03/2020 CLINICAL DATA:  Right chest tube. EXAM: CHEST - 2 VIEW COMPARISON:  07/01/2020. FINDINGS: Dual-lumen central catheter and right chest tube in stable position. No pneumothorax. Stable cardiomegaly. Bilateral pulmonary infiltrates again, improved from prior exam. Stable small bilateral pleural effusions. IMPRESSION: 1. Dual-lumen central catheter and right chest tube in stable position. No pneumothorax. 2. Improved bilateral pulmonary infiltrates. Persistent small bilateral pleural effusions. Electronically Signed   By: Marcello Moores  Register   On: 07/03/2020 07:48   CT CHEST WO CONTRAST  Result Date: 07/04/2020 CLINICAL DATA:  Pneumonia, effusion. EXAM: CT CHEST WITHOUT CONTRAST TECHNIQUE: Multidetector CT imaging of the chest was performed following the standard protocol without IV contrast. COMPARISON:  CT chest 06/25/2020 FINDINGS: Cardiovascular: Right IJ central venous catheter with the tip in the right atrium. Similar cardiomegaly. No evidence of a pericardial effusion. Mediastinum/Nodes: Limited evaluation without IV contrast without evidence of adenopathy. Lungs/Pleura: Slightly improved, but persistent consolidation and ground-glass opacification within the left  upper lobe. Slightly improved ground-glass opacities in the right upper lobe. No pneumothorax. There is a pigtail drain within the right posterior pleural space with associated decrease in size of a now small right pleural effusion. There is fluid tracking along the right major fissure. There is also loculated fluid extending more superiorly. New small to moderate left pleural effusion with overlying atelectasis. Upper Abdomen: Hyperdensity within the gallbladder, possibly sludge. No evidence of acute abnormality in the visualized upper abdomen. Musculoskeletal: No acute osseous  abnormality. IMPRESSION: 1. Slightly improved, but persistent multifocal consolidative and ground-glass opacities compatible with multifocal pneumonia. 2. Decrased size of a now small loculated right pleural effusion after placement of a pleural drain, as above. 3. New small to moderate left pleural effusion. Electronically Signed   By: Margaretha [place] MD   On: 07/04/2020 20:35   CT ANGIO CHEST PE W OR WO CONTRAST  Result Date: 06/25/2020 CLINICAL DATA:  Tachycardia EXAM: CT ANGIOGRAPHY CHEST WITH CONTRAST TECHNIQUE: Multidetector CT imaging of the chest was performed using the standard protocol during bolus administration of intravenous contrast. Multiplanar CT image reconstructions and MIPs were obtained to evaluate the vascular anatomy. CONTRAST:  32m OMNIPAQUE IOHEXOL 350 MG/ML SOLN COMPARISON:  Chest x-ray from earlier in the same day. FINDINGS: Cardiovascular: Thoracic aorta and its branches are well visualize without aneurysmal dilatation or dissection. Heart is enlarged in size similar to that seen on prior plain film examination. The pulmonary artery is well visualized within normal branching pattern. No filling defects to suggest pulmonary emboli are noted. Mediastinum/Nodes: Thoracic inlet is within normal limits. No sizable hilar or mediastinal adenopathy is noted. The esophagus as visualized is within normal limits. Lungs/Pleura: Large partially loculated right-sided pleural effusion with right lower lobe consolidation. Some ground-glass opacity is noted within the right upper lobe as well. Loculated fluid in the minor fissure is seen. Patchy airspace opacity is noted within the left upper lobe consistent with that seen on recent chest x-ray consistent with multifocal pneumonia. No left-sided pleural effusion is seen. No pneumothorax is noted. Upper Abdomen: Visualized upper abdomen shows no acute abnormality. Musculoskeletal: No acute bony abnormality is seen. Review of the MIP images confirms  the above findings. IMPRESSION: Multifocal infiltrates in the upper lobes bilaterally. Large right-sided pleural effusion which is at least partially loculated with associated right lower lobe collapse. This is also felt to be related to underlying pneumonia. No evidence of pulmonary emboli. Electronically Signed   By: MInez CatalinaM.D.   On: 06/25/2020 22:11   UK[place]RENAL  Result Date: 06/17/2020 CLINICAL DATA:  End-stage renal disease EXAM: RENAL / URINARY TRACT ULTRASOUND COMPLETE COMPARISON:  None. FINDINGS: Right Kidney: Renal measurements: 8.1 x 4.3 x 3.9 cm = volume: 73 mL. The kidney is atrophic and severely echogenic. There is no hydronephrosis. Left Kidney: Renal measurements: 6.7 x 3.8 x 3.6 cm = volume: 49 mL. The kidney is atrophic and severely echogenic. There is no hydronephrosis. Bladder: Appears normal for degree of bladder distention. Other: None. IMPRESSION: Small, echogenic kidneys bilaterally consistent with a history of end-stage renal disease. There is no hydronephrosis. Electronically Signed   By: CConstance HolsterM.D.   On: 06/17/2020 16:06   PERIPHERAL VASCULAR CATHETERIZATION  Result Date: 06/18/2020 See op note  DG Chest Port 1 View  Result Date: 07/05/2020 CLINICAL DATA:  Follow-up chest tube EXAM: PORTABLE CHEST 1 VIEW COMPARISON:  07/04/2020 FINDINGS: Cardiac shadow is enlarged but stable. Dialysis catheter is again noted and stable. Bilateral effusions are  noted with a pigtail catheter seen in the right lung base. No pneumothorax is noted. Loculated fluid along the right apex laterally is noted. Patchy airspace opacity is noted particularly in the inferior aspect of the left upper lobe similar to that seen on prior CT. IMPRESSION: No significant interval change from the prior exam. Electronically Signed   By: Inez Catalina M.D.   On: 07/05/2020 14:19   DG Chest Port 1 View  Result Date: 07/01/2020 CLINICAL DATA:  Pleural effusion follow-up. EXAM: PORTABLE CHEST 1 VIEW  COMPARISON:  June 29, 2020 FINDINGS: Injectable port and right-sided drainage catheter in stable position. The cardiac silhouette is enlarged. Mediastinal contours appear intact. Mild improvement in loculated appearing right pleural effusion. Patchy airspace consolidation in the left upper lobe and right upper lobe not significantly changed. Osseous structures are without acute abnormality. Soft tissues are grossly normal. IMPRESSION: 1. Mild improvement in loculated appearing right pleural effusion. 2. Patchy airspace consolidation in the left upper lobe and right upper lobe not significantly changed. Electronically Signed   By: Fidela Salisbury M.D.   On: 07/01/2020 10:40   DG Chest Port 1 View  Result Date: 06/29/2020 CLINICAL DATA:  Pleural effusion. EXAM: PORTABLE CHEST 1 VIEW COMPARISON:  06/27/2020.  CT 06/27/2020. FINDINGS: Dual-lumen catheter right chest tube in stable position. Mediastinum hilar structures normal. Stable cardiomegaly. Bilateral pulmonary infiltrates are again noted. Interim partial resolution of right pleural effusion from prior exam. No pneumothorax IMPRESSION: 1. Right chest tube in stable position. Interim partial resolution of right pleural effusion from prior exam. 2. Bilateral pulmonary infiltrates again noted without interim change. 3.  Stable cardiomegaly. Electronically Signed   By: Marcello Moores  Register   On: 06/29/2020 14:17   DG Chest Port 1 View  Result Date: 06/27/2020 CLINICAL DATA:  Status post right-sided pigtail catheter drainage in the pleural space EXAM: PORTABLE CHEST 1 VIEW COMPARISON:  06/25/2020 FINDINGS: Cardiac shadow is enlarged but stable. Dialysis catheter is again seen. New pigtail catheter is noted on the right with decrease in the degree of pleural effusion valve repair with the prior exam. Some loculated components remain primarily laterally and in the minor fissure. Patchy airspace opacities are noted and stable. IMPRESSION: No pneumothorax  following drainage catheter placement. Reduction in right-sided pleural effusion is seen. Persistent airspace opacities are seen. Electronically Signed   By: Inez Catalina M.D.   On: 06/27/2020 19:14   DG Chest Port 1 View  Result Date: 06/25/2020 CLINICAL DATA:  Fever and cough. EXAM: PORTABLE CHEST 1 VIEW COMPARISON:  06/16/20 FINDINGS: There is a right chest wall dialysis catheter with tips at the cavoatrial junction. Cardiac enlargement. Interval development of moderate to large right pleural effusion which may be partially loculated with fluid accumulation along the minor fissure. Veil like opacification of the right midlung and right base noted. Patchy airspace densities are noted within the left mid lung and left lower lobe. IMPRESSION: 1. Interval development of moderate to large right pleural effusion which may be partially loculated with fluid accumulation along the minor fissure. 2. Left mid lung and left lower lobe airspace densities concerning for amultifocal infection. Electronically Signed   By: Kerby Moors M.D.   On: 06/25/2020 19:23   DG Chest Port 1 View  Result Date: 06/16/2020 CLINICAL DATA:  Shortness of breath with cough and chest pain EXAM: PORTABLE CHEST 1 VIEW COMPARISON:  None. FINDINGS: There is somewhat ill-defined airspace opacity in portions of the left mid lower lung regions. The right lung  is clear. Heart is upper normal in size with pulmonary vascularity normal. No adenopathy. No bone lesions. IMPRESSION: Ill-defined airspace opacity left mid and lower lung regions consistent with multifocal pneumonia. Question atypical organism pneumonia given this appearance. Lungs elsewhere clear. Heart upper normal in size. No evident adenopathy. Electronically Signed   By: Lowella Grip III M.D.   On: 06/16/2020 09:21   CT IMAGE GUIDED DRAINAGE BY PERCUTANEOUS CATHETER  Result Date: 06/28/2020 INDICATION: 30 year old male with history of parapneumonic effusion secondary to  pneumonia. Pigtail thoracostomy tube requested by Thoracic Surgery. EXAM: CT IMAGE GUIDED DRAINAGE BY PERCUTANEOUS CATHETER COMPARISON:  Chest CT from 06/25/2020 MEDICATIONS: The patient is currently admitted to the hospital and receiving intravenous antibiotics. The antibiotics were administered within an appropriate time frame prior to the initiation of the procedure. ANESTHESIA/SEDATION: Moderate (conscious) sedation was employed during this procedure. A total of Versed 2 mg and Fentanyl 100 mcg was administered intravenously. Moderate Sedation Time: 14 minutes. The patient's level of consciousness and vital signs were monitored continuously by radiology nursing throughout the procedure under my direct supervision. CONTRAST:  None COMPLICATIONS: None immediate. PROCEDURE: Informed written consent was obtained from the patient after a discussion of the risks, benefits and alternatives to treatment. The patient was placed supine, partially left lateral decubitus on the CT gantry and a pre procedural CT was performed re-demonstrating the known fluid collection within the right pleural space. The procedure was planned. A timeout was performed prior to the initiation of the procedure. The right posterolateral and inferior thorax was prepped and draped in the usual sterile fashion. The overlying soft tissues were anesthetized with 1% lidocaine with epinephrine. Appropriate trajectory was planned with the use of a 22 gauge spinal needle. An 18 gauge trocar needle was advanced into the pleural space and a short Amplatz super stiff wire was coiled within the collection. Appropriate positioning was confirmed with a limited CT scan. The tract was serially dilated allowing placement of a 14 French all-purpose drainage catheter. Appropriate positioning was confirmed with a limited postprocedural CT scan. A total of approximately 100 ml of translucent, slightly blood tinged straw-colored fluid was aspirated. Samples were sent  to the lab for fluid analysis. The tube was connected to a pleurovac and sutured in place. A dressing was placed. The patient tolerated the procedure well without immediate post procedural complication. IMPRESSION: Successful CT guided placement of a 29 French all purpose drain catheter into the posterolateral and inferior right pleural space with aspiration of approximately 100 mL translucent, slightly blood tinged straw-colored fluid. Samples were sent to the laboratory as requested by the ordering clinical team. Electronically Signed   By: Ruthann Cancer MD   On: 06/28/2020 07:47      Labs: BNP (last 3 results) Recent Labs    06/16/20 0832  BNP 003.4*   Basic Metabolic Panel: Recent Labs  Lab 07/01/20 0351 07/02/20 0455 07/04/20 0404 07/05/20 0510 07/06/20 0433  NA 131* 132* 131* 134* 131*  K 4.6 5.2* 5.2* 4.3 4.7  CL 92* 93* 92* 94* 91*  CO2 '24 25 26 29 26  ' GLUCOSE 86 85 83 90 81  BUN 47* 60* 47* 29* 43*  CREATININE 10.64* 12.99* 11.18* 7.54* 9.88*  CALCIUM 8.6* 8.7* 8.7* 8.4* 8.7*  MG  --   --   --  1.8 2.1   Liver Function Tests: No results for input(s): AST, ALT, ALKPHOS, BILITOT, PROT, ALBUMIN in the last 168 hours. No results for input(s): LIPASE, AMYLASE in the  last 168 hours. No results for input(s): AMMONIA in the last 168 hours. CBC: Recent Labs  Lab 06/30/20 0544 07/04/20 0404 07/05/20 0510 07/06/20 0433  WBC 7.6 6.9 6.7 8.1  NEUTROABS 5.4 4.4  --   --   HGB 7.9* 7.6* 7.4* 8.0*  HCT 23.8* 23.0* 21.8* 25.7*  MCV 83.8 82.7 82.9 87.4  PLT 353 327 298 356   Cardiac Enzymes: No results for input(s): CKTOTAL, CKMB, CKMBINDEX, TROPONINI in the last 168 hours. BNP: Invalid input(s): POCBNP CBG: No results for input(s): GLUCAP in the last 168 hours. D-Dimer No results for input(s): DDIMER in the last 72 hours. Hgb A1c No results for input(s): HGBA1C in the last 72 hours. Lipid Profile No results for input(s): CHOL, HDL, LDLCALC, TRIG, CHOLHDL, LDLDIRECT  in the last 72 hours. Thyroid function studies No results for input(s): TSH, T4TOTAL, T3FREE, THYROIDAB in the last 72 hours.  Invalid input(s): FREET3 Anemia work up No results for input(s): VITAMINB12, FOLATE, FERRITIN, TIBC, IRON, RETICCTPCT in the last 72 hours. Urinalysis No results found for: COLORURINE, APPEARANCEUR, [place], [place], [place], [place], [place], [place], PROTEINUR, UROBILINOGEN, NITRITE, LEUKOCYTESUR Sepsis Labs Invalid input(s): PROCALCITONIN,  WBC,  LACTICIDVEN Microbiology Recent Results (from the past 240 hour(s))  MRSA PCR Screening     Status: None   Collection Time: 06/26/20  8:50 PM   Specimen: Nasal Mucosa; Nasopharyngeal  Result Value Ref Range Status   MRSA by PCR NEGATIVE NEGATIVE Final    Comment:        The GeneXpert MRSA Assay (FDA approved for NASAL specimens only), is one component of a comprehensive MRSA colonization surveillance program. It is not intended to diagnose MRSA infection nor to guide or monitor treatment for MRSA infections. Performed at [facility], [address] [place], [place] [address]   Aerobic/Anaerobic Culture (surgical/deep wound)     Status: None   Collection Time: 06/27/20  4:47 PM   Specimen: Pleural Fluid  Result Value Ref Range Status   Specimen Description   Final    PLEURAL Performed at [facility], [address], [place], [place] [address]    Special Requests   Final    PLEURAL FLUID CHEST Performed at [facility] [place], [address] [place], [place] [address]    Gram Stain   Final    RARE WBC PRESENT,BOTH PMN AND MONONUCLEAR NO ORGANISMS SEEN    Culture   Final    No growth aerobically or anaerobically. Performed at [facility], [place] [address] [place], [place] [address]    Report Status 07/02/2020 FINAL  Final     Total time spend on discharging this patient, including the last patient exam, discussing the hospital stay, instructions for ongoing  care as it relates to all pertinent caregivers, as well as preparing the medical discharge records, prescriptions, and/or referrals as applicable, is 50 minutes.    Enzo Bi, MD  [facility] 07/06/2020, 4:05 PM  If 7PM-7AM, please contact night-coverage

## 2020-07-06 NOTE — Progress Notes (Signed)
ID Spoke to patient and told him that I will follow him as OP in 3 weeks- 07/24/20

## 2020-07-06 NOTE — Progress Notes (Signed)
Progress Note Reviewed CXR from 09/30, and is without PTX.  Chest tube removed at bedside this morning No further thoracic surgery issues, please call with questions  -- Edison Simon, PA-C [facility] 07/06/2020, 9:17 AM [phone] M-F: 7am - 4pm

## 2020-07-06 NOTE — Progress Notes (Signed)
Patient left via [employer] service at approx. 2010. Voucher was given to patient. Patient was wheeled downstairs and volunteer services wheeled him to the ED entrance where the taxi was picking him up. IV was taken out by day shift. Discharge packet was given to patient by day shift.

## 2020-07-07 LAB — T-HELPER CELLS CD4/CD8 %
% CD 4 Pos. Lymph.: 12.3 % — ABNORMAL LOW (ref 30.8–58.5)
Absolute CD 4 Helper: 123 /uL — ABNORMAL LOW (ref 359–1519)
Basophils Absolute: 0 10*3/uL (ref 0.0–0.2)
Basos: 0 %
CD3+CD4+ Cells/CD3+CD8+ Cells Bld: 0.17 — ABNORMAL LOW (ref 0.92–3.72)
CD3+CD8+ Cells # Bld: 703 /uL (ref 109–897)
CD3+CD8+ Cells NFr Bld: 70.3 % — ABNORMAL HIGH (ref 12.0–35.5)
EOS (ABSOLUTE): 0.3 10*3/uL (ref 0.0–0.4)
Eos: 4 %
Hematocrit: 26.3 % — ABNORMAL LOW (ref 37.5–51.0)
Hemoglobin: 8.2 g/dL — ABNORMAL LOW (ref 13.0–17.7)
Immature Grans (Abs): 0.1 10*3/uL (ref 0.0–0.1)
Immature Granulocytes: 1 %
Lymphocytes Absolute: 1 10*3/uL (ref 0.7–3.1)
Lymphs: 12 %
MCH: 27.2 pg (ref 26.6–33.0)
MCHC: 31.2 g/dL — ABNORMAL LOW (ref 31.5–35.7)
MCV: 87 fL (ref 79–97)
Monocytes Absolute: 0.9 10*3/uL (ref 0.1–0.9)
Monocytes: 11 %
Neutrophils Absolute: 5.9 10*3/uL (ref 1.4–7.0)
Neutrophils: 72 %
Platelets: 375 10*3/uL (ref 150–450)
RBC: 3.02 x10E6/uL — ABNORMAL LOW (ref 4.14–5.80)
RDW: 15.8 % — ABNORMAL HIGH (ref 11.6–15.4)
WBC: 8.2 10*3/uL (ref 3.4–10.8)

## 2020-07-07 LAB — HIV-1 RNA QUANT-NO REFLEX-BLD
HIV 1 RNA Quant: 1470 copies/mL
LOG10 HIV-1 RNA: 3.167 log10copy/mL

## 2020-07-16 ENCOUNTER — Other Ambulatory Visit: Payer: Self-pay

## 2020-07-16 DIAGNOSIS — J90 Pleural effusion, not elsewhere classified: Secondary | ICD-10-CM

## 2020-07-18 ENCOUNTER — Other Ambulatory Visit: Payer: Self-pay

## 2020-07-18 ENCOUNTER — Encounter: Payer: Self-pay | Admitting: Emergency Medicine

## 2020-07-18 DIAGNOSIS — N186 End stage renal disease: Secondary | ICD-10-CM | POA: Diagnosis present

## 2020-07-18 DIAGNOSIS — Z992 Dependence on renal dialysis: Secondary | ICD-10-CM

## 2020-07-18 DIAGNOSIS — Z8249 Family history of ischemic heart disease and other diseases of the circulatory system: Secondary | ICD-10-CM

## 2020-07-18 DIAGNOSIS — I160 Hypertensive urgency: Secondary | ICD-10-CM | POA: Diagnosis present

## 2020-07-18 DIAGNOSIS — A09 Infectious gastroenteritis and colitis, unspecified: Principal | ICD-10-CM | POA: Diagnosis present

## 2020-07-18 DIAGNOSIS — E875 Hyperkalemia: Secondary | ICD-10-CM | POA: Diagnosis present

## 2020-07-18 DIAGNOSIS — Z79899 Other long term (current) drug therapy: Secondary | ICD-10-CM

## 2020-07-18 DIAGNOSIS — Z833 Family history of diabetes mellitus: Secondary | ICD-10-CM

## 2020-07-18 DIAGNOSIS — F17210 Nicotine dependence, cigarettes, uncomplicated: Secondary | ICD-10-CM | POA: Diagnosis present

## 2020-07-18 DIAGNOSIS — Z20822 Contact with and (suspected) exposure to covid-19: Secondary | ICD-10-CM | POA: Diagnosis present

## 2020-07-18 DIAGNOSIS — D631 Anemia in chronic kidney disease: Secondary | ICD-10-CM | POA: Diagnosis present

## 2020-07-18 DIAGNOSIS — I120 Hypertensive chronic kidney disease with stage 5 chronic kidney disease or end stage renal disease: Secondary | ICD-10-CM | POA: Diagnosis present

## 2020-07-18 DIAGNOSIS — R1011 Right upper quadrant pain: Secondary | ICD-10-CM

## 2020-07-18 DIAGNOSIS — K8020 Calculus of gallbladder without cholecystitis without obstruction: Secondary | ICD-10-CM | POA: Diagnosis present

## 2020-07-18 DIAGNOSIS — D696 Thrombocytopenia, unspecified: Secondary | ICD-10-CM | POA: Diagnosis present

## 2020-07-18 DIAGNOSIS — B20 Human immunodeficiency virus [HIV] disease: Secondary | ICD-10-CM | POA: Diagnosis present

## 2020-07-18 LAB — COMPREHENSIVE METABOLIC PANEL
ALT: 74 U/L — ABNORMAL HIGH (ref 0–44)
AST: 65 U/L — ABNORMAL HIGH (ref 15–41)
Albumin: 2.7 g/dL — ABNORMAL LOW (ref 3.5–5.0)
Alkaline Phosphatase: 78 U/L (ref 38–126)
Anion gap: 11 (ref 5–15)
BUN: 26 mg/dL — ABNORMAL HIGH (ref 6–20)
CO2: 28 mmol/L (ref 22–32)
Calcium: 7.5 mg/dL — ABNORMAL LOW (ref 8.9–10.3)
Chloride: 98 mmol/L (ref 98–111)
Creatinine, Ser: 5 mg/dL — ABNORMAL HIGH (ref 0.61–1.24)
GFR, Estimated: 14 mL/min — ABNORMAL LOW (ref >60)
Glucose, Bld: 84 mg/dL (ref 70–99)
Potassium: 4.3 mmol/L (ref 3.5–5.1)
Sodium: 137 mmol/L (ref 135–145)
Total Bilirubin: 0.7 mg/dL (ref 0.3–1.2)
Total Protein: 7.6 g/dL (ref 6.5–8.1)

## 2020-07-18 LAB — CBC
HCT: 22 % — ABNORMAL LOW (ref 39.0–52.0)
Hemoglobin: 6.9 g/dL — ABNORMAL LOW (ref 13.0–17.0)
MCH: 27.9 pg (ref 26.0–34.0)
MCHC: 31.4 g/dL (ref 30.0–36.0)
MCV: 89.1 fL (ref 80.0–100.0)
Platelets: 83 10*3/uL — ABNORMAL LOW (ref 150–400)
RBC: 2.47 MIL/uL — ABNORMAL LOW (ref 4.22–5.81)
RDW: 19.8 % — ABNORMAL HIGH (ref 11.5–15.5)
WBC: 6.7 10*3/uL (ref 4.0–10.5)
nRBC: 0.3 % — ABNORMAL HIGH (ref 0.0–0.2)

## 2020-07-18 LAB — LIPASE, BLOOD: Lipase: 41 U/L (ref 11–51)

## 2020-07-18 NOTE — ED Triage Notes (Signed)
EMS brought pt in from home; c/o rt sided abd pain x 4 days after eating Daniel Combs; dialysis (last tx today)

## 2020-07-18 NOTE — ED Triage Notes (Signed)
Pt to ED via EMS from home c/o right side abd pain x4 days, pt is dialysis MWF and had it today. Diarrhea first two days but has subsided, pain is sharp and aching.  States came on after eating Brendolyn Patty.  Denies urinary changes.  Pt A&Ox4, chest rise even and unlabored, in NAD at this time.

## 2020-07-19 ENCOUNTER — Emergency Department: Payer: Medicaid Other

## 2020-07-19 ENCOUNTER — Inpatient Hospital Stay
Admission: EM | Admit: 2020-07-19 | Discharge: 2020-07-21 | DRG: 977 | Disposition: A | Discharge status: Home / Self Care | Payer: Medicaid Other | Attending: Internal Medicine | Admitting: Internal Medicine

## 2020-07-19 ENCOUNTER — Observation Stay: Payer: Medicaid Other

## 2020-07-19 ENCOUNTER — Encounter: Payer: Self-pay | Admitting: Internal Medicine

## 2020-07-19 ENCOUNTER — Other Ambulatory Visit: Payer: Self-pay

## 2020-07-19 DIAGNOSIS — K529 Noninfective gastroenteritis and colitis, unspecified: Secondary | ICD-10-CM | POA: Diagnosis not present

## 2020-07-19 DIAGNOSIS — N186 End stage renal disease: Secondary | ICD-10-CM

## 2020-07-19 DIAGNOSIS — D649 Anemia, unspecified: Secondary | ICD-10-CM

## 2020-07-19 DIAGNOSIS — I10 Essential (primary) hypertension: Secondary | ICD-10-CM | POA: Diagnosis present

## 2020-07-19 DIAGNOSIS — B20 Human immunodeficiency virus [HIV] disease: Secondary | ICD-10-CM | POA: Diagnosis not present

## 2020-07-19 DIAGNOSIS — K8020 Calculus of gallbladder without cholecystitis without obstruction: Secondary | ICD-10-CM | POA: Diagnosis not present

## 2020-07-19 DIAGNOSIS — D631 Anemia in chronic kidney disease: Secondary | ICD-10-CM | POA: Diagnosis present

## 2020-07-19 DIAGNOSIS — R1011 Right upper quadrant pain: Secondary | ICD-10-CM | POA: Diagnosis not present

## 2020-07-19 DIAGNOSIS — D696 Thrombocytopenia, unspecified: Secondary | ICD-10-CM | POA: Diagnosis present

## 2020-07-19 DIAGNOSIS — I160 Hypertensive urgency: Secondary | ICD-10-CM

## 2020-07-19 LAB — CBC WITH DIFFERENTIAL/PLATELET
Abs Immature Granulocytes: 0.03 10*3/uL (ref 0.00–0.07)
Basophils Absolute: 0 10*3/uL (ref 0.0–0.1)
Basophils Relative: 1 %
Eosinophils Absolute: 1.1 10*3/uL — ABNORMAL HIGH (ref 0.0–0.5)
Eosinophils Relative: 14 %
HCT: 25.9 % — ABNORMAL LOW (ref 39.0–52.0)
Hemoglobin: 8.3 g/dL — ABNORMAL LOW (ref 13.0–17.0)
Immature Granulocytes: 0 %
Lymphocytes Relative: 15 %
Lymphs Abs: 1.2 10*3/uL (ref 0.7–4.0)
MCH: 28.4 pg (ref 26.0–34.0)
MCHC: 32 g/dL (ref 30.0–36.0)
MCV: 88.7 fL (ref 80.0–100.0)
Monocytes Absolute: 1.1 10*3/uL — ABNORMAL HIGH (ref 0.1–1.0)
Monocytes Relative: 14 %
Neutro Abs: 4.3 10*3/uL (ref 1.7–7.7)
Neutrophils Relative %: 56 %
Platelets: 90 10*3/uL — ABNORMAL LOW (ref 150–400)
RBC: 2.92 MIL/uL — ABNORMAL LOW (ref 4.22–5.81)
RDW: 19 % — ABNORMAL HIGH (ref 11.5–15.5)
WBC: 7.6 10*3/uL (ref 4.0–10.5)
nRBC: 0 % (ref 0.0–0.2)

## 2020-07-19 LAB — URINALYSIS, COMPLETE (UACMP) WITH MICROSCOPIC
Bilirubin Urine: NEGATIVE
Glucose, UA: 50 mg/dL — AB
Hgb urine dipstick: NEGATIVE
Ketones, ur: NEGATIVE mg/dL
Leukocytes,Ua: NEGATIVE
Nitrite: NEGATIVE
Protein, ur: 100 mg/dL — AB
Specific Gravity, Urine: 1.006 (ref 1.005–1.030)
pH: 9 — ABNORMAL HIGH (ref 5.0–8.0)

## 2020-07-19 LAB — GLUCOSE, CAPILLARY: Glucose-Capillary: 112 mg/dL — ABNORMAL HIGH (ref 70–99)

## 2020-07-19 LAB — PREPARE RBC (CROSSMATCH)

## 2020-07-19 LAB — LACTATE DEHYDROGENASE: LDH: 282 U/L — ABNORMAL HIGH (ref 98–192)

## 2020-07-19 LAB — RESPIRATORY PANEL BY RT PCR (FLU A&B, COVID)
Influenza A by PCR: NEGATIVE
Influenza B by PCR: NEGATIVE
SARS Coronavirus 2 by RT PCR: NEGATIVE

## 2020-07-19 MED ORDER — ONDANSETRON HCL 4 MG/2ML IJ SOLN
4.0000 mg | Freq: Four times a day (QID) | INTRAMUSCULAR | Status: DC | PRN
  Administered 2020-07-19: 4 mg via INTRAVENOUS
  Filled 2020-07-19: qty 2

## 2020-07-19 MED ORDER — CALCIUM CARBONATE-VITAMIN D 500-200 MG-UNIT PO TABS
1.0000 | ORAL_TABLET | Freq: Three times a day (TID) | ORAL | Status: DC
  Administered 2020-07-19 – 2020-07-21 (×5): 1 via ORAL
  Filled 2020-07-19 (×7): qty 1

## 2020-07-19 MED ORDER — ONDANSETRON HCL 4 MG PO TABS: 4.0000 mg | ORAL_TABLET | Freq: Four times a day (QID) | ORAL | Status: DC | PRN

## 2020-07-19 MED ORDER — TECHNETIUM TC 99M MEBROFENIN IV KIT
5.3490 | PACK | Freq: Once | INTRAVENOUS | Status: AC | PRN
  Administered 2020-07-19: 5.349 via INTRAVENOUS

## 2020-07-19 MED ORDER — HYDRALAZINE HCL 20 MG/ML IJ SOLN
10.0000 mg | Freq: Four times a day (QID) | INTRAMUSCULAR | Status: DC | PRN
  Administered 2020-07-19: 10 mg via INTRAVENOUS
  Filled 2020-07-19: qty 1

## 2020-07-19 MED ORDER — HYDROMORPHONE HCL 1 MG/ML IJ SOLN
1.0000 mg | Freq: Once | INTRAMUSCULAR | Status: AC
  Administered 2020-07-19: 1 mg via INTRAVENOUS
  Filled 2020-07-19: qty 1

## 2020-07-19 MED ORDER — HYDROMORPHONE HCL 1 MG/ML IJ SOLN
1.0000 mg | INTRAMUSCULAR | Status: DC | PRN
  Administered 2020-07-19 (×2): 1 mg via INTRAVENOUS
  Filled 2020-07-19 (×2): qty 1

## 2020-07-19 MED ORDER — SODIUM CHLORIDE 0.9% IV SOLUTION
Freq: Once | INTRAVENOUS | Status: DC
  Filled 2020-07-19: qty 250

## 2020-07-19 MED ORDER — HYDRALAZINE HCL 50 MG PO TABS
100.0000 mg | ORAL_TABLET | Freq: Three times a day (TID) | ORAL | Status: DC
  Administered 2020-07-19 – 2020-07-21 (×4): 100 mg via ORAL
  Filled 2020-07-19 (×5): qty 2

## 2020-07-19 MED ORDER — RENA-VITE PO TABS
1.0000 | ORAL_TABLET | Freq: Every day | ORAL | Status: DC
  Administered 2020-07-19 – 2020-07-20 (×2): 1 via ORAL
  Filled 2020-07-19 (×3): qty 1

## 2020-07-19 MED ORDER — FERROUS SULFATE 75 (15 FE) MG/ML PO SOLN
18.0000 mg | Freq: Every day | ORAL | Status: DC
  Administered 2020-07-19 – 2020-07-21 (×2): 18 mg via ORAL
  Filled 2020-07-19 (×3): qty 1.2

## 2020-07-19 MED ORDER — LABETALOL HCL 5 MG/ML IV SOLN
10.0000 mg | Freq: Four times a day (QID) | INTRAVENOUS | Status: DC | PRN
  Administered 2020-07-19: 10 mg via INTRAVENOUS
  Filled 2020-07-19: qty 4

## 2020-07-19 MED ORDER — CARVEDILOL 25 MG PO TABS
25.0000 mg | ORAL_TABLET | Freq: Two times a day (BID) | ORAL | Status: DC
  Administered 2020-07-19 – 2020-07-21 (×3): 25 mg via ORAL
  Filled 2020-07-19 (×2): qty 1

## 2020-07-19 MED ORDER — DARUN-COBIC-EMTRICIT-TENOFAF 800-150-200-10 MG PO TABS
1.0000 | ORAL_TABLET | Freq: Every day | ORAL | Status: DC
  Administered 2020-07-20: 1 via ORAL
  Filled 2020-07-19 (×4): qty 1

## 2020-07-19 MED ORDER — SULFAMETHOXAZOLE-TRIMETHOPRIM 400-80 MG PO TABS
1.0000 | ORAL_TABLET | Freq: Every day | ORAL | Status: DC
  Administered 2020-07-19: 1 via ORAL
  Filled 2020-07-19: qty 1

## 2020-07-19 MED ORDER — MORPHINE SULFATE (PF) 4 MG/ML IV SOLN
4.0000 mg | Freq: Once | INTRAVENOUS | Status: AC
  Administered 2020-07-19: 4 mg via INTRAVENOUS
  Filled 2020-07-19: qty 1

## 2020-07-19 MED ORDER — ONDANSETRON HCL 4 MG/2ML IJ SOLN
4.0000 mg | Freq: Once | INTRAMUSCULAR | Status: AC
  Administered 2020-07-19: 4 mg via INTRAVENOUS
  Filled 2020-07-19: qty 2

## 2020-07-19 MED ORDER — HYDRALAZINE HCL 50 MG PO TABS
50.0000 mg | ORAL_TABLET | Freq: Three times a day (TID) | ORAL | Status: DC
  Administered 2020-07-19: 50 mg via ORAL
  Filled 2020-07-19: qty 1

## 2020-07-19 MED ORDER — HYDROMORPHONE HCL 1 MG/ML IJ SOLN
1.0000 mg | INTRAMUSCULAR | Status: DC | PRN
  Administered 2020-07-19 – 2020-07-21 (×10): 1 mg via INTRAVENOUS
  Filled 2020-07-19 (×10): qty 1

## 2020-07-19 MED ORDER — EPOETIN ALFA 10000 UNIT/ML IJ SOLN
10000.0000 [IU] | INTRAMUSCULAR | Status: DC
  Administered 2020-07-20: 10000 [IU] via INTRAVENOUS
  Filled 2020-07-19: qty 1

## 2020-07-19 MED ORDER — IRON 18 MG PO TBCR: 1.0000 | EXTENDED_RELEASE_TABLET | Freq: Every day | ORAL | Status: DC

## 2020-07-19 MED ORDER — DARBEPOETIN ALFA 25 MCG/0.42ML IJ SOSY: 25.0000 ug | PREFILLED_SYRINGE | INTRAMUSCULAR | Status: DC

## 2020-07-19 MED ORDER — AMLODIPINE BESYLATE 10 MG PO TABS
10.0000 mg | ORAL_TABLET | Freq: Every day | ORAL | Status: DC
  Administered 2020-07-19 – 2020-07-21 (×2): 10 mg via ORAL
  Filled 2020-07-19: qty 1
  Filled 2020-07-19: qty 2

## 2020-07-19 NOTE — ED Notes (Signed)
Patient transported to CT 

## 2020-07-19 NOTE — H&P (Signed)
History and Physical    Daniel Combs JIR:[id number] DOB: 24-Jul-1991 DOA: 07/19/2020  PCP: Pcp, No   Patient coming from: Home  I have personally briefly reviewed patient's old medical records in [place]  Chief Complaint: Abdominal pain  HPI: Daniel Combs is a 29 y.o. male with medical history significant for HIV/AIDS, end-stage renal disease on hemodialysis (dialysis days on Monday/Wednesday/Friday), history of hypertension who presents to the emergency room for evaluation of abdominal pain.  Onset of the pain was about 4 days prior to his admission, pain is mostly in the right upper quadrant and is described as severe, cramping pain associated with nausea.  Patient states his symptoms started after he had food from [facility].  He has also had some diarrhea with blood in it however that has resolved. Abdominal pain is associated with nausea but no vomiting, no fever, no chills, no urinary symptoms.  He denies having any chest pain, shortness of breath, cough, dizziness or lightheadedness. Labs show sodium 137, potassium 4.3, chloride 98, bicarb 28, BUN 26, creatinine 5.0, albumin 2.7, lipase 41, AST 65, ALT 74, white count 6.7, hemoglobin 6.9, hematocrit 22, MCV 89, RDW 19.8, platelet count 83 Respiratory viral panel is negative CT scan of abdomen and pelvis showed abnormal proximal small bowel loops, likely distal duodenum and proximal jejunum in the medial left upper quadrant demonstrate substantial circumferential wall thickening with perienteric edema.  Features compatible with infectious/inflammatory enteritis.  Cholelithiasis.  Atrophic kidneys. Right upper quadrant ultrasound shows cholelithiasis without sonographic evidence of acute cholecystitis.  Trace right pleural effusion and perihepatic ascites. Twelve-lead EKG reviewed by me shows sinus rhythm with nonspecific T wave abnormalities in the lateral leads.    ED Course: Patient is a 29 year old African-American  male with a history of HIV/ AIDS, end-stage renal disease on hemodialysis as well as hypertension who presents to the emergency room for evaluation of abdominal pain and bloody diarrhea.  Patient noted to have anemia and thrombocytopenia.  CT scan of abdomen and pelvis shows features compatible with infectious/inflammatory enteritis.  Right upper quadrant ultrasound shows cholelithiasis with no evidence of acute cholecystitis.  Patient will be admitted to the hospital for further evaluation.  Review of Systems: As per HPI otherwise 10 point review of systems negative.    Past Medical History:  Diagnosis Date  . Hypertension     Past Surgical History:  Procedure Laterality Date  . DIALYSIS/PERMA CATHETER INSERTION N/A 06/18/2020   Procedure: DIALYSIS/PERMA CATHETER INSERTION;  Surgeon: Algernon Huxley, MD;  Location: [facility];  Service: Cardiovascular;  Laterality: N/A;     reports that he has been smoking. He has never used smokeless tobacco. He reports previous alcohol use. He reports that he does not use drugs.  No Known Allergies  Family History  Problem Relation Age of Onset  . Diabetes Mellitus II Mother   . Hypertension Mother      Prior to Admission medications   Medication Sig Start Date End Date Taking? Authorizing Provider  amLODipine (NORVASC) 10 MG tablet Take 1 tablet (10 mg total) by mouth daily. 07/06/20 10/04/20 Yes Enzo Bi, MD  calcium-vitamin D (OSCAL 500/200 D-3) 500-200 MG-UNIT tablet Take 1 tablet by mouth 3 (three) times daily. 07/06/20 10/04/20 Yes Enzo Bi, MD  carvedilol (COREG) 25 MG tablet Take 1 tablet (25 mg total) by mouth 2 (two) times daily with a meal. 07/06/20 10/04/20 Yes Enzo Bi, MD  Darunavir-Cobicisctat-Emtricitabine-Tenofovir Alafenamide (SYMTUZA) 800-150-200-10 MG TABS Take 1 tablet by  mouth daily with supper. 07/06/20  Yes Enzo Bi, MD  epoetin alfa (EPOGEN) 10000 UNIT/ML injection Inject 1 mL (10,000 Units total) into the vein every  Monday, Wednesday, and Friday with hemodialysis. 07/06/20  Yes Enzo Bi, MD  hydrALAZINE (APRESOLINE) 50 MG tablet Take 1 tablet (50 mg total) by mouth every 8 (eight) hours. 07/06/20 10/04/20 Yes Enzo Bi, MD  multivitamin (RENA-VIT) TABS tablet Take 1 tablet by mouth at bedtime. 07/06/20  Yes Enzo Bi, MD  sulfamethoxazole-trimethoprim (BACTRIM) 400-80 MG tablet Take 1 tablet by mouth daily. 07/06/20 10/04/20 Yes Enzo Bi, MD  Ferrous Fumarate (IRON) 18 MG TBCR Take 1 tablet (18 mg total) by mouth daily. Can give any formulation of iron supplement. Patient not taking: Reported on 07/19/2020 07/06/20 10/04/20  Enzo Bi, MD    Physical Exam: Vitals:   07/18/20 2215 07/19/20 0119 07/19/20 0615 07/19/20 0823  BP:  (!) 177/100 (!) 173/131 (!) 162/119  Pulse:  99 94 89  Resp:  20 18 16   Temp:  99.2 F (37.3 C)  98 F (36.7 C)  TempSrc:  Oral  Oral  SpO2:  100% 99% 100%  Weight: 56.7 kg     Height: 5\' 6"  (1.676 m)        Vitals:   07/18/20 2215 07/19/20 0119 07/19/20 0615 07/19/20 0823  BP:  (!) 177/100 (!) 173/131 (!) 162/119  Pulse:  99 94 89  Resp:  20 18 16   Temp:  99.2 F (37.3 C)  98 F (36.7 C)  TempSrc:  Oral  Oral  SpO2:  100% 99% 100%  Weight: 56.7 kg     Height: 5\' 6"  (1.676 m)       Constitutional: NAD, alert and oriented x 3 Eyes: PERRL, lids and conjunctivae pallor ENMT: Mucous membranes are moist.  Neck: normal, supple, no masses, no thyromegaly Respiratory: clear to auscultation bilaterally, no wheezing, no crackles. Normal respiratory effort. No accessory muscle use.  Cardiovascular: Regular rate and rhythm, no murmurs / rubs / gallops. No extremity edema. 2+ pedal pulses. No carotid bruits.  Abdomen: RUQ tenderness, no masses palpated. No hepatosplenomegaly. Bowel sounds positive.  Musculoskeletal: no clubbing / cyanosis. No joint deformity upper and lower extremities.  Skin: no rashes, lesions, ulcers.  Neurologic: No gross focal neurologic  deficit. Psychiatric: Normal mood and affect.   Labs on Admission: I have personally reviewed following labs and imaging studies  CBC: Recent Labs  Lab 07/18/20 2216  WBC 6.7  HGB 6.9*  HCT 22.0*  MCV 89.1  PLT 83*   Basic Metabolic Panel: Recent Labs  Lab 07/18/20 2216  NA 137  K 4.3  CL 98  CO2 28  GLUCOSE 84  BUN 26*  CREATININE 5.00*  CALCIUM 7.5*   GFR: Estimated Creatinine Clearance: 17.3 mL/min (A) (by C-G formula based on SCr of 5 mg/dL (H)). Liver Function Tests: Recent Labs  Lab 07/18/20 2216  AST 65*  ALT 74*  ALKPHOS 78  BILITOT 0.7  PROT 7.6  ALBUMIN 2.7*   Recent Labs  Lab 07/18/20 2216  LIPASE 41   No results for input(s): AMMONIA in the last 168 hours. Coagulation Profile: No results for input(s): INR, PROTIME in the last 168 hours. Cardiac Enzymes: No results for input(s): CKTOTAL, CKMB, CKMBINDEX, TROPONINI in the last 168 hours. BNP (last 3 results) No results for input(s): PROBNP in the last 8760 hours. HbA1C: No results for input(s): HGBA1C in the last 72 hours. CBG: No results for input(s): GLUCAP in the  last 168 hours. Lipid Profile: No results for input(s): CHOL, HDL, LDLCALC, TRIG, CHOLHDL, LDLDIRECT in the last 72 hours. Thyroid Function Tests: No results for input(s): TSH, T4TOTAL, FREET4, T3FREE, THYROIDAB in the last 72 hours. Anemia Panel: No results for input(s): VITAMINB12, FOLATE, FERRITIN, TIBC, IRON, RETICCTPCT in the last 72 hours. Urine analysis: No results found for: COLORURINE, APPEARANCEUR, [place], [place], GLUCOSEU, HGBUR, BILIRUBINUR, KETONESUR, PROTEINUR, UROBILINOGEN, NITRITE, LEUKOCYTESUR  Radiological Exams on Admission: CT ABDOMEN PELVIS WO CONTRAST  Result Date: 07/19/2020 CLINICAL DATA:  Right-sided abdominal pain for 4 days. EXAM: CT ABDOMEN AND PELVIS WITHOUT CONTRAST TECHNIQUE: Multidetector CT imaging of the abdomen and pelvis was performed following the standard protocol without IV contrast.  COMPARISON:  None. FINDINGS: Lower chest: Small right pleural effusion. Heart is enlarged. Atelectasis noted at the right base. Catheter tip projects in the right atrium. Hepatobiliary: No focal abnormality in the liver on this study without intravenous contrast. Layering tiny calcified gallstones evident. No intrahepatic or extrahepatic biliary dilation. Pancreas: No focal mass lesion. No dilatation of the main duct. No intraparenchymal cyst. No peripancreatic edema. Spleen: No splenomegaly. No focal mass lesion. Adrenals/Urinary Tract: No adrenal nodule or mass. Kidneys appear atrophic. No evidence for hydroureter The urinary bladder appears normal for the degree of distention. Stomach/Bowel: Stomach is unremarkable. No gastric wall thickening. No evidence of outlet obstruction. Study is limited by lack of oral and intravenous contrast, but there appears to be a proximal small bowel loop, potentially distal duodenum and proximal jejunum in the medial left upper quadrant demonstrating substantial circumferential wall thickening and edema, likely with confluent perienteric edema (see axial 33/2 and coronal 38/5. No small bowel dilatation. The terminal ileum is normal. The appendix is normal. Prominent colonic stool volume. Vascular/Lymphatic: No abdominal aortic aneurysm. No evidence for abdominal lymphadenopathy on this noncontrast study. No pelvic sidewall lymphadenopathy. Reproductive: The prostate gland and seminal vesicles are unremarkable. Other: Small volume free fluid noted in the pelvis. Diffuse body wall edema evident. Musculoskeletal: No worrisome lytic or sclerotic osseous abnormality. IMPRESSION: 1. Limited study due to lack of oral and intravenous contrast. Abnormal proximal small bowel loops, likely distal duodenum and proximal jejunum in the medial left upper quadrant demonstrates substantial circumferential wall thickening with perienteric edema. Imaging features compatible with  infectious/inflammatory enteritis. Other etiologies such as ulcerative disease or ischemia are considered less likely, but not excluded. 2. Small volume free fluid in the pelvis with diffuse body wall edema. 3. Cholelithiasis. 4. Small right pleural effusion. 5. Atrophic kidneys. 6. Prominent colonic stool volume. Imaging features could be compatible with clinical constipation. Electronically Signed   By: Misty Stanley M.D.   On: 07/19/2020 05:17   US Abdomen Limited RUQ  Result Date: 07/19/2020 CLINICAL DATA:  Right upper quadrant abdominal pain EXAM: ULTRASOUND ABDOMEN LIMITED RIGHT UPPER QUADRANT COMPARISON:  None. FINDINGS: Gallbladder: The gallbladder contains numerous small, layering mobile gallstones and sludge. The gallbladder, however, is not distended, there is no gallbladder wall thickening, and no pericholecystic fluid is identified. The sonographic Percell Miller sign is reportedly negative. Common bile duct: Diameter: 4 mm in proximal diameter Liver: No focal lesion identified. Within normal limits in parenchymal echogenicity. Portal vein is patent on color Doppler imaging with normal direction of blood flow towards the liver. Other: Trace right pleural effusion and perihepatic free fluid is noted. The right kidney is markedly echogenic in keeping with changes of underlying medical renal disease. This is not fully assessed on this examination. IMPRESSION: Cholelithiasis without sonographic evidence of acute  cholecystitis. Trace right pleural effusion and perihepatic ascites Diffusely increased renal cortical echogenicity in keeping with underlying medical renal disease. Electronically Signed   By: Fidela Salisbury MD   On: 07/19/2020 01:05    EKG: Independently reviewed.  Sinus rhythm Nonspecific T wave abnormalities in the lateral leads  Assessment/Plan Principal Problem:   Enteritis Active Problems:   ESRD (end stage renal disease) (HCC)   Anemia in ESRD (end-stage renal disease) (HCC)   HIV  (human immunodeficiency virus infection) (HCC)   Cholelithiasis   Thrombocytopenia (HCC)     Acute enteritis Patient presents to the emergency room for evaluation of abdominal pain associated with episodes of bloody diarrhea Acute enteritis appears to be infectious but may also be inflammatory in origin Concern about infection with E. Coli Patient with acute thrombocytopenia and worsening anemia We will consult infectious disease Obtain LDH level and peripheral smear to rule out schistocytes   End-stage renal disease on hemodialysis Dialysis days are Monday/Wednesday/Friday We will request nephrology consult for renal replacement therapy during this admission    Cholelithiasis Patient presents for evaluation of right upper quadrant pain which started after a meal Abdominal ultrasound shows cholelithiasis without evidence of acute cholecystitis We will request surgical consult   Anemia and end-stage renal disease Patient has end-stage renal disease with anemia and baseline hemoglobin of 8g/dl He has worsening anemia today and his hemoglobin is 6.9g/dl Discussed with nephrology and will transfuse 1 unit of packed RBC    Acute thrombocytopenia Most likely secondary to an infectious etiology Monitor patient closely for bleeding   Hypertension Continue amlodipine, carvedilol and hydralazine   HIV/AIDS Continue antiretroviral therapy Continue prophylactic therapy with Bactrim   DVT prophylaxis: SCD Code Status: Full code Family Communication: Greater than 50% of time was spent discussing patient's condition and plan of care with him at the bedside.  All questions and concerns have been addressed.  He verbalizes understanding and agrees with the plan of care. Disposition Plan: Back to previous home environment Consults called: Nephrology/surgery/infectious disease    Jayon Matton MD [facility]     07/19/2020, 8:36 AM

## 2020-07-19 NOTE — Progress Notes (Signed)
Patient arrived to unit from ER in stable condition. 

## 2020-07-19 NOTE — ED Notes (Signed)
Pt refused to take all meds at once, says he wants to eat with meds.  Advised pt scan was scheduled for approx 1300 and they need pt to be NPO for 4 hours prior to exam.

## 2020-07-19 NOTE — Progress Notes (Signed)
Known from last admission, patient visiting mother from [place], new to hemodialysis. Established at [facility] MWF 6:30am, patient walks to treatments. Please contact me with any dialysis placement concerns.  Elvera Bicker Dialysis Coordinator [phone]

## 2020-07-19 NOTE — Consult Note (Signed)
Subjective:   CC: Abdominal pain  HPI:  Daniel Combs is a 29 y.o. male who is consulted by Agbata for evaluation of above cc.  Symptoms were first noted 6 days ago. Pain is located in right upper quadrant, sharp, constant, worsening, radiating to back.  Associated with nausea, exacerbated by nothing specific.  Patient has had 2 days worth of diarrhea with some blood, followed by some regular stooling.  Last bowel movement was yesterday.  Never had pain like this before.  No instigating factor.     Past Medical History:  has a past medical history of Hypertension., HIV, ESRD on HD  Past Surgical History:  has a past surgical history that includes DIALYSIS/PERMA CATHETER INSERTION (N/A, 06/18/2020).  Family History: family history includes Diabetes Mellitus II in his mother; Hypertension in his mother.  Social History:  reports that he has been smoking. He has never used smokeless tobacco. He reports previous alcohol use. He reports that he does not use drugs.  Current Medications:  Prior to Admission medications   Medication Sig Start Date End Date Taking? Authorizing Provider  amLODipine (NORVASC) 10 MG tablet Take 1 tablet (10 mg total) by mouth daily. 07/06/20 10/04/20 Yes Enzo Bi, MD  calcium-vitamin D (OSCAL 500/200 D-3) 500-200 MG-UNIT tablet Take 1 tablet by mouth 3 (three) times daily. 07/06/20 10/04/20 Yes Enzo Bi, MD  carvedilol (COREG) 25 MG tablet Take 1 tablet (25 mg total) by mouth 2 (two) times daily with a meal. 07/06/20 10/04/20 Yes Enzo Bi, MD  Darunavir-Cobicisctat-Emtricitabine-Tenofovir Alafenamide (SYMTUZA) 800-150-200-10 MG TABS Take 1 tablet by mouth daily with supper. 07/06/20  Yes Enzo Bi, MD  epoetin alfa (EPOGEN) 10000 UNIT/ML injection Inject 1 mL (10,000 Units total) into the vein every Monday, Wednesday, and Friday with hemodialysis. 07/06/20  Yes Enzo Bi, MD  hydrALAZINE (APRESOLINE) 50 MG tablet Take 1 tablet (50 mg total) by mouth every 8 (eight)  hours. 07/06/20 10/04/20 Yes Enzo Bi, MD  multivitamin (RENA-VIT) TABS tablet Take 1 tablet by mouth at bedtime. 07/06/20  Yes Enzo Bi, MD  sulfamethoxazole-trimethoprim (BACTRIM) 400-80 MG tablet Take 1 tablet by mouth daily. 07/06/20 10/04/20 Yes Enzo Bi, MD  Ferrous Fumarate (IRON) 18 MG TBCR Take 1 tablet (18 mg total) by mouth daily. Can give any formulation of iron supplement. Patient not taking: Reported on 07/19/2020 07/06/20 10/04/20  Enzo Bi, MD    Allergies:  Allergies as of 07/18/2020  . (No Known Allergies)    ROS:  General: Denies weight loss, weight gain, fatigue, fevers, chills, and night sweats. Eyes: Denies blurry vision, double vision, eye pain, itchy eyes, and tearing. Ears: Denies hearing loss, earache, and ringing in ears. Nose: Denies sinus pain, congestion, infections, runny nose, and nosebleeds. Mouth/throat: Denies hoarseness, sore throat, bleeding gums, and difficulty swallowing. Heart: Denies chest pain, palpitations, racing heart, irregular heartbeat, leg pain or swelling, and decreased activity tolerance. Respiratory: Denies breathing difficulty, shortness of breath, wheezing, cough, and sputum. GI: Denies change in appetite, heartburn, vomiting, constipation, GU: Denies difficulty urinating, pain with urinating, urgency, frequency, blood in urine. Musculoskeletal: Denies joint stiffness, pain, swelling, muscle weakness. Skin: Denies rash, itching, mass, tumors, sores, and boils Neurologic: Denies headache, fainting, dizziness, seizures, numbness, and tingling. Psychiatric: Denies depression, anxiety, difficulty sleeping, and memory loss. Endocrine: Denies heat or cold intolerance, and increased thirst or urination. Blood/lymph: Denies easy bruising, and swollen glands     Objective:     BP (!) 177/131   Pulse 87   Temp 98.2  F (36.8 C) (Oral)   Resp 15   Ht 5\' 6"  (1.676 m)   Wt 56.7 kg   SpO2 93%   BMI 20.18 kg/m    Constitutional :   alert, cooperative, appears stated age and no distress  Lymphatics/Throat:  no asymmetry, masses, or scars  Respiratory:  clear to auscultation bilaterally  Cardiovascular:  regular rate and rhythm  Gastrointestinal: Soft, no guarding, but focal tenderness to palpation right upper quadrant.   Musculoskeletal: Steady movement  Skin: Cool and moist  Psychiatric: Normal affect, non-agitated, not confused       LABS:  CMP Latest Ref Rng & Units 07/18/2020 07/06/2020 07/05/2020  Glucose 70 - 99 mg/dL 84 81 90  BUN 6 - 20 mg/dL 26(H) 43(H) 29(H)  Creatinine 0.61 - 1.24 mg/dL 5.00(H) 9.88(H) 7.54(H)  Sodium 135 - 145 mmol/L 137 131(L) 134(L)  Potassium 3.5 - 5.1 mmol/L 4.3 4.7 4.3  Chloride 98 - 111 mmol/L 98 91(L) 94(L)  CO2 22 - 32 mmol/L 28 26 29   Calcium 8.9 - 10.3 mg/dL 7.5(L) 8.7(L) 8.4(L)  Total Protein 6.5 - 8.1 g/dL 7.6 - -  Total Bilirubin 0.3 - 1.2 mg/dL 0.7 - -  Alkaline Phos 38 - 126 U/L 78 - -  AST 15 - 41 U/L 65(H) - -  ALT 0 - 44 U/L 74(H) - -   CBC Latest Ref Rng & Units 07/18/2020 07/06/2020 07/06/2020  WBC 4.0 - 10.5 K/uL 6.7 8.2 8.1  Hemoglobin 13.0 - 17.0 g/dL 6.9(L) 8.0(L) 8.2(L)  Hematocrit 39 - 52 % 22.0(L) 25.7(L) 26.3(L)  Platelets 150 - 400 K/uL 83(L) 356 375     RADS: CLINICAL DATA:  Cholelithiasis  EXAM: NUCLEAR MEDICINE HEPATOBILIARY IMAGING  TECHNIQUE: Sequential images of the abdomen were obtained out to 60 minutes following intravenous administration of radiopharmaceutical.  RADIOPHARMACEUTICALS:  5.349 mCi Tc-55m  Choletec IV  COMPARISON:  CT 07/19/2020, ultrasound 07/19/2020  FINDINGS: Prompt uptake and biliary excretion of activity by the liver is seen. Gallbladder activity is visualized, consistent with patency of cystic duct. Biliary activity passes into small bowel, consistent with patent common bile duct. Activity remains within the gallbladder at the 60 minutes of imaging. A Scholl order requisition included in ejection fraction  measurement however patient electively terminated the exam prematurely secondary to abdominal pain.  IMPRESSION: Normal uptake and excretion with patency of the cystic and common bile duct as above.  Patient electively terminated the exam prior to the second hour of imaging for ejection fraction measurement.   Electronically Signed   By: Lovena Le   CLINICAL DATA:  Right upper quadrant abdominal pain  EXAM: ULTRASOUND ABDOMEN LIMITED RIGHT UPPER QUADRANT  COMPARISON:  None.  FINDINGS: Gallbladder:  The gallbladder contains numerous small, layering mobile gallstones and sludge. The gallbladder, however, is not distended, there is no gallbladder wall thickening, and no pericholecystic fluid is identified. The sonographic Percell Miller sign is reportedly negative.  Common bile duct:  Diameter: 4 mm in proximal diameter  Liver:  No focal lesion identified. Within normal limits in parenchymal echogenicity. Portal vein is patent on color Doppler imaging with normal direction of blood flow towards the liver.  Other: Trace right pleural effusion and perihepatic free fluid is noted. The right kidney is markedly echogenic in keeping with changes of underlying medical renal disease. This is not fully assessed on this examination.  IMPRESSION: Cholelithiasis without sonographic evidence of acute cholecystitis.  Trace right pleural effusion and perihepatic ascites  Diffusely increased renal cortical  echogenicity in keeping with underlying medical renal disease.   Electronically Signed   By: Fidela Salisbury MD   On: 07/19/2020 01:05  CLINICAL DATA:  Right-sided abdominal pain for 4 days.  EXAM: CT ABDOMEN AND PELVIS WITHOUT CONTRAST  TECHNIQUE: Multidetector CT imaging of the abdomen and pelvis was performed following the standard protocol without IV contrast.  COMPARISON:  None.  FINDINGS: Lower chest: Small right pleural effusion. Heart is  enlarged. Atelectasis noted at the right base. Catheter tip projects in the right atrium.  Hepatobiliary: No focal abnormality in the liver on this study without intravenous contrast. Layering tiny calcified gallstones evident. No intrahepatic or extrahepatic biliary dilation.  Pancreas: No focal mass lesion. No dilatation of the main duct. No intraparenchymal cyst. No peripancreatic edema.  Spleen: No splenomegaly. No focal mass lesion.  Adrenals/Urinary Tract: No adrenal nodule or mass. Kidneys appear atrophic. No evidence for hydroureter The urinary bladder appears normal for the degree of distention.  Stomach/Bowel: Stomach is unremarkable. No gastric wall thickening. No evidence of outlet obstruction. Study is limited by lack of oral and intravenous contrast, but there appears to be a proximal small bowel loop, potentially distal duodenum and proximal jejunum in the medial left upper quadrant demonstrating substantial circumferential wall thickening and edema, likely with confluent perienteric edema (see axial 33/2 and coronal 38/5. No small bowel dilatation. The terminal ileum is normal. The appendix is normal. Prominent colonic stool volume.  Vascular/Lymphatic: No abdominal aortic aneurysm. No evidence for abdominal lymphadenopathy on this noncontrast study. No pelvic sidewall lymphadenopathy.  Reproductive: The prostate gland and seminal vesicles are unremarkable.  Other: Small volume free fluid noted in the pelvis. Diffuse body wall edema evident.  Musculoskeletal: No worrisome lytic or sclerotic osseous abnormality.  IMPRESSION: 1. Limited study due to lack of oral and intravenous contrast. Abnormal proximal small bowel loops, likely distal duodenum and proximal jejunum in the medial left upper quadrant demonstrates substantial circumferential wall thickening with perienteric edema. Imaging features compatible with infectious/inflammatory  enteritis. Other etiologies such as ulcerative disease or ischemia are considered less likely, but not excluded. 2. Small volume free fluid in the pelvis with diffuse body wall edema. 3. Cholelithiasis. 4. Small right pleural effusion. 5. Atrophic kidneys. 6. Prominent colonic stool volume. Imaging features could be compatible with clinical constipation.   Electronically Signed   By: Misty Stanley M.D.   On: 07/19/2020 05:17  Assessment:     Right upper quadrant abdominal pain, with recent history of bloody diarrhea.  Bloody diarrhea has resolved but the pain as continued.  Plan:    Constant pain lasting for over 5 days with worsening severity, yet a normal white count, normal imaging, and a normal HIDA scan, makes it highly unlikely source of symptoms is gallbladder in nature.  Recommend further work-up by medical team for possible enteritis and other causes of abdominal pain which was noted within the CT scan, although it was mostly in the left upper quadrant.  No obvious surgical issues are apparent at this time.  Surgery will peripherally follow for now.  Please call with any additional questions or concerns

## 2020-07-19 NOTE — ED Notes (Signed)
Pt taken for hepatoscan.

## 2020-07-19 NOTE — ED Provider Notes (Signed)
[facility] Emergency Department Provider Note  ____________________________________________  Time seen: Approximately 4:34 AM  I have reviewed the triage vital signs and the nursing notes.   HISTORY  Chief Complaint Abdominal Pain   HPI Daniel Combs is a 29 y.o. male history of HIV, ESRD on HD (MWF), hypertension who presents for evaluation of abdominal pain.  Patient reports of 4 days ago, he went to [employer] to eat.  After that he started having abdominal pain.  He is complaining of right-sided constant severe cramping radiating to the right upper back associated with nausea.  Initially he had 2 days of diarrhea with some blood in it however that has resolved.  No melena or bloody stools for the last 2 days.  Has had nausea but no vomiting.  No fever, no chills, no dysuria, no hematuria, no chest pain or shortness of breath. Last dialysis was yesterday.  Past Medical History:  Diagnosis Date  . Hypertension     Patient Active Problem List   Diagnosis Date Noted  . Pleural effusion on right 06/26/2020  . Bacteremia due to Streptococcus pneumoniae 06/21/2020  . CAP (community acquired pneumonia) 06/16/2020  . ESRD (end stage renal disease) ([place]) 06/16/2020  . Sepsis ([place]) 06/16/2020  . Hyponatremia 06/16/2020  . Hyperkalemia 06/16/2020  . Elevated troponin 06/16/2020  . Anemia in ESRD (end-stage renal disease) ([place]) 06/16/2020  . Hypertension     Past Surgical History:  Procedure Laterality Date  . DIALYSIS/PERMA CATHETER INSERTION N/A 06/18/2020   Procedure: DIALYSIS/PERMA CATHETER INSERTION;  Surgeon: Algernon Huxley, MD;  Location: [place] [facility];  Service: Cardiovascular;  Laterality: N/A;    Prior to Admission medications   Medication Sig Start Date End Date Taking? Authorizing Provider  amLODipine (NORVASC) 10 MG tablet Take 1 tablet (10 mg total) by mouth daily. 07/06/20 10/04/20  Enzo Bi, MD  calcium-vitamin D (OSCAL 500/200  D-3) 500-200 MG-UNIT tablet Take 1 tablet by mouth 3 (three) times daily. 07/06/20 10/04/20  Enzo Bi, MD  carvedilol (COREG) 25 MG tablet Take 1 tablet (25 mg total) by mouth 2 (two) times daily with a meal. 07/06/20 10/04/20  Enzo Bi, MD  Darunavir-Cobicisctat-Emtricitabine-Tenofovir Alafenamide (SYMTUZA) 800-150-200-10 MG TABS Take 1 tablet by mouth daily with supper. 07/06/20   Enzo Bi, MD  epoetin alfa (EPOGEN) 10000 UNIT/ML injection Inject 1 mL (10,000 Units total) into the vein every Monday, Wednesday, and Friday with hemodialysis. 07/06/20   Enzo Bi, MD  Ferrous Fumarate (IRON) 18 MG TBCR Take 1 tablet (18 mg total) by mouth daily. Can give any formulation of iron supplement. 07/06/20 10/04/20  Enzo Bi, MD  hydrALAZINE (APRESOLINE) 50 MG tablet Take 1 tablet (50 mg total) by mouth every 8 (eight) hours. 07/06/20 10/04/20  Enzo Bi, MD  multivitamin (RENA-VIT) TABS tablet Take 1 tablet by mouth at bedtime. 07/06/20   Enzo Bi, MD  sulfamethoxazole-trimethoprim (BACTRIM) 400-80 MG tablet Take 1 tablet by mouth daily. 07/06/20 10/04/20  Enzo Bi, MD    Allergies Patient has no known allergies.  Family History  Problem Relation Age of Onset  . Diabetes Mellitus II Mother   . Hypertension Mother     Social History Social History   Tobacco Use  . Smoking status: Current Every Day Smoker  . Smokeless tobacco: Never Used  Substance Use Topics  . Alcohol use: Not Currently  . Drug use: Never    Review of Systems  Constitutional: Negative for fever. Eyes: Negative for visual changes.  ENT: Negative for sore throat. Neck: No neck pain  Cardiovascular: Negative for chest pain. Respiratory: Negative for shortness of breath. Gastrointestinal: + R sided abdominal pain nausea, and diarrhea. No vomiting  Genitourinary: Negative for dysuria. Musculoskeletal: Negative for back pain. Skin: Negative for rash. Neurological: Negative for headaches, weakness or numbness. Psych: No SI  or HI  ____________________________________________   PHYSICAL EXAM:  VITAL SIGNS: ED Triage Vitals  Enc Vitals Group     BP 07/18/20 2213 (!) 176/139     Pulse Rate 07/18/20 2213 (!) 103     Resp 07/18/20 2213 20     Temp 07/18/20 2213 98.6 F (37 C)     Temp Source 07/18/20 2213 Oral     SpO2 07/18/20 2213 100 %     Weight 07/18/20 2215 125 lb (56.7 kg)     Height 07/18/20 2215 5\' 6"  (1.676 m)     Head Circumference --      Peak Flow --      Pain Score 07/18/20 2229 9     Pain Loc --      Pain Edu? --      Excl. in [place]? --     Constitutional: Alert and oriented, looks uncomfortable due to pain.  HEENT:      Head: Normocephalic and atraumatic.         Eyes: Conjunctivae are normal. Sclera is non-icteric.       Mouth/Throat: Mucous membranes are moist.       Neck: Supple with no signs of meningismus. Cardiovascular: Regular rate and rhythm. No murmurs, gallops, or rubs.  Respiratory: Normal respiratory effort. Lungs are clear to auscultation bilaterally.  Gastrointestinal: Soft, significant tenderness to palpation the right upper quadrant with positive Murphy sign and localize guarding. Musculoskeletal: No edema, cyanosis, or erythema of extremities. Neurologic: Normal speech and language. Face is symmetric. Moving all extremities. No gross focal neurologic deficits are appreciated. Skin: Skin is warm, dry and intact. No rash noted. Psychiatric: Mood and affect are normal. Speech and behavior are normal.  ____________________________________________   LABS (all labs ordered are listed, but only abnormal results are displayed)  Labs Reviewed  COMPREHENSIVE METABOLIC PANEL - Abnormal; Notable for the following components:      Result Value   BUN 26 (*)    Creatinine, Ser 5.00 (*)    Calcium 7.5 (*)    Albumin 2.7 (*)    AST 65 (*)    ALT 74 (*)    GFR, Estimated 14 (*)    All other components within normal limits  CBC - Abnormal; Notable for the following  components:   RBC 2.47 (*)    Hemoglobin 6.9 (*)    HCT 22.0 (*)    RDW 19.8 (*)    Platelets 83 (*)    nRBC 0.3 (*)    All other components within normal limits  LIPASE, BLOOD  URINALYSIS, COMPLETE (UACMP) WITH MICROSCOPIC  PATHOLOGIST SMEAR REVIEW   ____________________________________________  EKG  none  ____________________________________________  RADIOLOGY  I have personally reviewed the images performed during this visit and I agree with the Radiologist's read.   Interpretation by Radiologist:  CT ABDOMEN PELVIS WO CONTRAST  Result Date: 07/19/2020 CLINICAL DATA:  Right-sided abdominal pain for 4 days. EXAM: CT ABDOMEN AND PELVIS WITHOUT CONTRAST TECHNIQUE: Multidetector CT imaging of the abdomen and pelvis was performed following the standard protocol without IV contrast. COMPARISON:  None. FINDINGS: Lower chest: Small right pleural effusion. Heart is enlarged. Atelectasis noted  at the right base. Catheter tip projects in the right atrium. Hepatobiliary: No focal abnormality in the liver on this study without intravenous contrast. Layering tiny calcified gallstones evident. No intrahepatic or extrahepatic biliary dilation. Pancreas: No focal mass lesion. No dilatation of the main duct. No intraparenchymal cyst. No peripancreatic edema. Spleen: No splenomegaly. No focal mass lesion. Adrenals/Urinary Tract: No adrenal nodule or mass. Kidneys appear atrophic. No evidence for hydroureter The urinary bladder appears normal for the degree of distention. Stomach/Bowel: Stomach is unremarkable. No gastric wall thickening. No evidence of outlet obstruction. Study is limited by lack of oral and intravenous contrast, but there appears to be a proximal small bowel loop, potentially distal duodenum and proximal jejunum in the medial left upper quadrant demonstrating substantial circumferential wall thickening and edema, likely with confluent perienteric edema (see axial 33/2 and coronal 38/5.  No small bowel dilatation. The terminal ileum is normal. The appendix is normal. Prominent colonic stool volume. Vascular/Lymphatic: No abdominal aortic aneurysm. No evidence for abdominal lymphadenopathy on this noncontrast study. No pelvic sidewall lymphadenopathy. Reproductive: The prostate gland and seminal vesicles are unremarkable. Other: Small volume free fluid noted in the pelvis. Diffuse body wall edema evident. Musculoskeletal: No worrisome lytic or sclerotic osseous abnormality. IMPRESSION: 1. Limited study due to lack of oral and intravenous contrast. Abnormal proximal small bowel loops, likely distal duodenum and proximal jejunum in the medial left upper quadrant demonstrates substantial circumferential wall thickening with perienteric edema. Imaging features compatible with infectious/inflammatory enteritis. Other etiologies such as ulcerative disease or ischemia are considered less likely, but not excluded. 2. Small volume free fluid in the pelvis with diffuse body wall edema. 3. Cholelithiasis. 4. Small right pleural effusion. 5. Atrophic kidneys. 6. Prominent colonic stool volume. Imaging features could be compatible with clinical constipation. Electronically Signed   By: Misty Stanley M.D.   On: 07/19/2020 05:17   US Abdomen Limited RUQ  Result Date: 07/19/2020 CLINICAL DATA:  Right upper quadrant abdominal pain EXAM: ULTRASOUND ABDOMEN LIMITED RIGHT UPPER QUADRANT COMPARISON:  None. FINDINGS: Gallbladder: The gallbladder contains numerous small, layering mobile gallstones and sludge. The gallbladder, however, is not distended, there is no gallbladder wall thickening, and no pericholecystic fluid is identified. The sonographic Percell Miller sign is reportedly negative. Common bile duct: Diameter: 4 mm in proximal diameter Liver: No focal lesion identified. Within normal limits in parenchymal echogenicity. Portal vein is patent on color Doppler imaging with normal direction of blood flow towards the  liver. Other: Trace right pleural effusion and perihepatic free fluid is noted. The right kidney is markedly echogenic in keeping with changes of underlying medical renal disease. This is not fully assessed on this examination. IMPRESSION: Cholelithiasis without sonographic evidence of acute cholecystitis. Trace right pleural effusion and perihepatic ascites Diffusely increased renal cortical echogenicity in keeping with underlying medical renal disease. Electronically Signed   By: Fidela Salisbury MD   On: 07/19/2020 01:05     ____________________________________________   PROCEDURES  Procedure(s) performed:yes .1-3 Lead EKG Interpretation Performed by: Rudene Re, MD Authorized by: Rudene Re, MD     Interpretation: non-specific     ECG rate assessment: normal     Rhythm: sinus rhythm     Ectopy: none     Critical Care performed:  None ____________________________________________   INITIAL IMPRESSION / ASSESSMENT AND PLAN / ED COURSE   29 y.o. male history of HIV, ESRD on HD (MWF), hypertension who presents for evaluation of 4 days of constant, severe RUQ abdominal pain that started  after eating at [facility], initially associated with bloody diarrhea which has now resolved.  Patient looks uncomfortable due to pain, he is tender to palpation the right upper quadrant with localized guarding and positive Murphy sign.  His blood work has several abnormalities including mild but newly elevated LFTs with AST of 65, ALT of 74.  Normal T bili and lipase.  Acute on chronic anemia with hemoglobin of 6.4 which is most likely due to 2 days of bloody diarrhea.  Patient also has new thrombocytopenia with platelets of 83. No leukocytosis.  Ddx gb pathology, pancreatitis, hepatitis, colitis, diverticulitis, appendicitis.  RUQ [place] visualized by me showing cholelithiasis with no evidence of cholecystitis, confirmed by radiology.  We will get a CT abdomen pelvis.  Will treat patient with  IV morphine and Zofran.  Old medical records reviewed showing recent prolonged admission of 20 days with discharge 13 days ago for bacteremia. Patient reports that he had fully recovered from that admission and was doing well until 4 days ago.  Patient placed on telemetry for close monitoring.  _________________________ 5:34 AM on 07/19/2020 -----------------------------------------  CT consistent with enteritis, visualized by me confirmed by radiology.  With worsening anemia, new thrombocytopenia, and abdominal pain will admit to the hospitalist service.    _____________________________________________ Please note:  Patient was evaluated in Emergency Department today for the symptoms described in the history of present illness. Patient was evaluated in the context of the global COVID-19 pandemic, which necessitated consideration that the patient might be at risk for infection with the SARS-CoV-2 virus that causes COVID-19. Institutional protocols and algorithms that pertain to the evaluation of patients at risk for COVID-19 are in a state of rapid change based on information released by regulatory bodies including the CDC and federal and state organizations. These policies and algorithms were followed during the patient's care in the ED.  Some ED evaluations and interventions may be delayed as a result of limited staffing during the pandemic.   [place] Controlled Substance Database was reviewed by me. ____________________________________________   FINAL CLINICAL IMPRESSION(S) / ED DIAGNOSES   Final diagnoses:  RUQ pain  Enteritis  Acute on chronic anemia  Thrombocytopenia (HCC)      NEW MEDICATIONS STARTED DURING THIS VISIT:  ED Discharge Orders    None       Note:  This document was prepared using Dragon voice recognition software and may include unintentional dictation errors.    Rudene Re, MD 07/19/20 [phone]

## 2020-07-19 NOTE — Progress Notes (Signed)
Explained to pt diet order was for clear liquids. Pt upset about this. Pt refused anything to drink and refused scheduled meds.

## 2020-07-19 NOTE — Consult Note (Signed)
NAME: Daniel Combs  DOB: [birth year]  MRN: [id number]  Date/Time: 07/19/2020 2:58 PM  REQUESTING PROVIDER: Agbata Subjective:  REASON FOR CONSULT: enterohemorrhagic ecoli? ? Daniel Combs is a 29 y.o. male with a history of HIv/AIDS, HTN, ESRD recently started dialysis, strep pneumo bacteremia with rt pneumonia and empyema was recently discharged from hospital after a prolonged stay 06/16/20-07/06/20 He is here for rt upper quadrant pain for the past 4 days after eating at [employer] he says. Pt says he had a steak burger 4 days ago and developed rt upper quadrant pain very soon- 3-4 hrs later he had 1 loose stool- noted some blood- total  of 3 loose stools and it cleared in 48 hrs. None in the past 2 days No fever, no chills.no nausea or vomiting He is hungry The pain is constant right  upper quadrant and goes to the back It is different than the pain he had with rt empyema and pneumonia Vitals in the ED BP 07/18/20 2213 (!) 176/139     Pulse Rate 07/18/20 2213 (!) 103     Resp 07/18/20 2213 20     Temp 07/18/20 2213 98.6 F (37 C)     Temp Source 07/18/20 2213 Oral     SpO2 07/18/20 2213 100 %   Labs revealed a Hb of 6.9, PLT 83 ( was 256 on 10/1) AST 65, ALT 74, bili 0.7 CT abdomen/pelvis Hepatobiliary: No focal abnormality in the liver on this study without intravenous contrast. Layering tiny calcified gallstones evident. No intrahepatic or extrahepatic biliary dilation. Stomach/Bowel: Stomach is unremarkable. No gastric wall thickening.No evidence of outlet obstruction. Study is limited by lack of oral and intravenous contrast, but there appears to be a proximal small bowel loop, potentially distal duodenum and proximal jejunum in the medial left upper quadrant demonstrating substantial circumferential wall thickening and edema, likely with confluent perienteric edema  No small bowel dilatation. The terminal ileum is normal. The appendix is normal. Prominent colonic stool  volume. Past Medical History:  Diagnosis Date  . Hypertension     Past Surgical History:  Procedure Laterality Date  . DIALYSIS/PERMA CATHETER INSERTION N/A 06/18/2020   Procedure: DIALYSIS/PERMA CATHETER INSERTION;  Surgeon: Algernon Huxley, MD;  Location: [facility];  Service: Cardiovascular;  Laterality: N/A;    Social History   Socioeconomic History  . Marital status: Unknown    Spouse name: Not on file  . Number of children: Not on file  . Years of education: Not on file  . Highest education level: Not on file  Occupational History  . Not on file  Tobacco Use  . Smoking status: Current Every Day Smoker  . Smokeless tobacco: Never Used  Substance and Sexual Activity  . Alcohol use: Not Currently  . Drug use: Never  . Sexual activity: Not on file  Other Topics Concern  . Not on file  Social History Narrative  . Not on file   Social Determinants of Health   Financial Resource Strain:   . Difficulty of Paying Living Expenses: Not on file  Food Insecurity:   . Worried About Charity fundraiser in the Last Year: Not on file  . Ran Out of Food in the Last Year: Not on file  Transportation Needs:   . Lack of Transportation (Medical): Not on file  . Lack of Transportation (Non-Medical): Not on file  Physical Activity:   . Days of Exercise per Week: Not on file  . Minutes of Exercise  per Session: Not on file  Stress:   . Feeling of Stress : Not on file  Social Connections:   . Frequency of Communication with Friends and Family: Not on file  . Frequency of Social Gatherings with Friends and Family: Not on file  . Attends Religious Services: Not on file  . Active Member of Clubs or Organizations: Not on file  . Attends Archivist Meetings: Not on file  . Marital Status: Not on file  Intimate Partner Violence:   . Fear of Current or Ex-Partner: Not on file  . Emotionally Abused: Not on file  . Physically Abused: Not on file  . Sexually Abused: Not on  file    Family History  Problem Relation Age of Onset  . Diabetes Mellitus II Mother   . Hypertension Mother    No Known Allergies   ? Current Facility-Administered Medications  Medication Dose Route Frequency Provider Last Rate Last Admin  . 0.9 %  sodium chloride infusion (Manually program via Guardrails IV Fluids)   Intravenous Once Agbata, Tochukwu, MD      . amLODipine (NORVASC) tablet 10 mg  10 mg Oral Daily Agbata, Tochukwu, MD   10 mg at 07/19/20 0933  . calcium-vitamin D (OSCAL WITH D) 500-200 MG-UNIT per tablet 1 tablet  1 tablet Oral TID Collier Bullock, MD   1 tablet at 07/19/20 1453  . carvedilol (COREG) tablet 25 mg  25 mg Oral BID WC Agbata, Tochukwu, MD   25 mg at 07/19/20 0932  . [START ON 07/25/2020] Darbepoetin Alfa (ARANESP) injection 25 mcg  25 mcg Subcutaneous Q7 days Agbata, Tochukwu, MD      . Darunavir-Cobicisctat-Emtricitabine-Tenofovir Alafenamide (SYMTUZA) 800-150-200-10 MG TABS 1 tablet  1 tablet Oral Q supper Agbata, Tochukwu, MD      . ferrous sulfate (FER-IN-SOL) 75 (15 Fe) MG/ML solution 18 mg of iron  18 mg of iron Oral Daily Hallaji, Sheema M, RPH   18 mg of iron at 07/19/20 1455  . hydrALAZINE (APRESOLINE) tablet 50 mg  50 mg Oral Q8H Agbata, Tochukwu, MD   50 mg at 07/19/20 1457  . HYDROmorphone (DILAUDID) injection 1 mg  1 mg Intravenous Q4H PRN Agbata, Tochukwu, MD   1 mg at 07/19/20 1447  . multivitamin (RENA-VIT) tablet 1 tablet  1 tablet Oral QHS Agbata, Tochukwu, MD      . ondansetron (ZOFRAN) tablet 4 mg  4 mg Oral Q6H PRN Agbata, Tochukwu, MD       Or  . ondansetron (ZOFRAN) injection 4 mg  4 mg Intravenous Q6H PRN Agbata, Tochukwu, MD   4 mg at 07/19/20 1447  . sulfamethoxazole-trimethoprim (BACTRIM) 400-80 MG per tablet 1 tablet  1 tablet Oral Daily Agbata, Tochukwu, MD   1 tablet at 07/19/20 1452   Current Outpatient Medications  Medication Sig Dispense Refill  . amLODipine (NORVASC) 10 MG tablet Take 1 tablet (10 mg total) by mouth  daily. 30 tablet 2  . calcium-vitamin D (OSCAL 500/200 D-3) 500-200 MG-UNIT tablet Take 1 tablet by mouth 3 (three) times daily. 90 tablet 2  . carvedilol (COREG) 25 MG tablet Take 1 tablet (25 mg total) by mouth 2 (two) times daily with a meal. 60 tablet 2  . Darunavir-Cobicisctat-Emtricitabine-Tenofovir Alafenamide (SYMTUZA) 800-150-200-10 MG TABS Take 1 tablet by mouth daily with supper. 30 tablet   . epoetin alfa (EPOGEN) 10000 UNIT/ML injection Inject 1 mL (10,000 Units total) into the vein every Monday, Wednesday, and Friday with hemodialysis. 1 mL   .  hydrALAZINE (APRESOLINE) 50 MG tablet Take 1 tablet (50 mg total) by mouth every 8 (eight) hours. 90 tablet 2  . multivitamin (RENA-VIT) TABS tablet Take 1 tablet by mouth at bedtime.  0  . sulfamethoxazole-trimethoprim (BACTRIM) 400-80 MG tablet Take 1 tablet by mouth daily. 30 tablet 2  . Ferrous Fumarate (IRON) 18 MG TBCR Take 1 tablet (18 mg total) by mouth daily. Can give any formulation of iron supplement. (Patient not taking: Reported on 07/19/2020) 30 tablet 2     Abtx:  Anti-infectives (From admission, onward)   Start     Dose/Rate Route Frequency Ordered Stop   07/19/20 1700  Darunavir-Cobicisctat-Emtricitabine-Tenofovir Alafenamide (SYMTUZA) 800-150-200-10 MG TABS 1 tablet        1 tablet Oral Daily with supper 07/19/20 7517     07/19/20 1000  sulfamethoxazole-trimethoprim (BACTRIM) 400-80 MG per tablet 1 tablet        1 tablet Oral Daily 07/19/20 0833        REVIEW OF SYSTEMS:  Const: negative fever, negative chills, negative weight loss Eyes: negative diplopia or visual changes, negative eye pain ENT: negative coryza, negative sore throat Resp: negative cough, hemoptysis, dyspnea Cards: negative for chest pain, palpitations, lower extremity edema GU: negative for frequency, dysuria and hematuria GI: as above Skin: negative for rash and pruritus Heme: negative for easy bruising and gum/nose bleeding MS: negative for  myalgias, arthralgias, back pain and muscle weakness Neurolo:negative for headaches, dizziness, vertigo, memory problems  Psych: negative for feelings of anxiety, depression  Endocrine: negative for thyroid, diabetes Allergy/Immunology- negative for any medication or food allergies ?  Objective:  VITALS:  BP (!) 181/132   Pulse 84   Temp 98.2 F (36.8 C) (Oral)   Resp 16   Ht 5\' 6"  (1.676 m)   Wt 56.7 kg   SpO2 100%   BMI 20.18 kg/m    Patient Vitals for the past 24 hrs:  BP Temp Temp src Pulse Resp SpO2 Height Weight  07/19/20 1946 (!) 172/120 97.9 F (36.6 C) Oral (!) 103 16 100 % -- --  07/19/20 1822 (!) 166/129 98.6 F (37 C) Oral 86 16 98 % -- --  07/19/20 1600 (!) 177/131 -- -- 87 15 93 % -- --  07/19/20 1457 (!) 181/132 -- -- -- -- -- -- --  07/19/20 1303 (!) 162/129 98.2 F (36.8 C) Oral -- 16 100 % -- --  07/19/20 1300 (!) 172/135 -- -- 85 17 99 % -- --  07/19/20 1230 (!) 163/129 98.1 F (36.7 C) -- 84 16 96 % -- --  07/19/20 1130 (!) 165/130 98.2 F (36.8 C) -- 88 16 99 % -- --  07/19/20 1115 (!) 169/133 98.2 F (36.8 C) Oral 83 16 97 % -- --  07/19/20 1058 (!) 166/124 98.2 F (36.8 C) Oral 86 16 98 % -- --  07/19/20 0929 (!) 179/140 -- -- 92 16 100 % -- --  07/19/20 0823 (!) 162/119 98 F (36.7 C) Oral 89 16 100 % -- --  07/19/20 0615 (!) 173/131 -- -- 94 18 99 % -- --  07/19/20 0119 (!) 177/100 99.2 F (37.3 C) Oral 99 20 100 % -- --  07/18/20 2215 -- -- -- -- -- -- 5\' 6"  (1.676 m) 56.7 kg  07/18/20 2213 (!) 176/139 98.6 F (37 C) Oral (!) 103 20 100 % -- --   PHYSICAL EXAM: eating sorbet General: Alert, cooperative, no distress, appears stated age. Not septic Head: Normocephalic, without  obvious abnormality, atraumatic. Eyes: Conjunctivae clear, anicteric sclerae. Pupils are equal ENT Nares normal. No drainage or sinus tenderness. Lips, mucosa, and tongue normal. No Thrush Neck: Supple, symmetrical, no adenopathy, thyroid: non tender no carotid  bruit and no JVD. Back: No CVA tenderness. Lungs: b/la ir entry Heart: Regular rate and rhythm, no murmur, rub or gallop. Rt dialysis cath Abdomen: tender over the rt upper quadrant Soft Bowel sounds present 'Extremities: atraumatic, no cyanosis. No edema. No clubbing Skin: No rashes or lesions. Or bruising Lymph: Cervical, supraclavicular normal. Neurologic: Grossly non-focal Pertinent Labs Lab Results CBC    Component Value Date/Time   WBC 6.7 07/18/2020 2216   RBC 2.47 (L) 07/18/2020 2216   HGB 6.9 (L) 07/18/2020 2216   HGB 8.2 (L) 07/06/2020 0433   HCT 22.0 (L) 07/18/2020 2216   HCT 26.3 (L) 07/06/2020 0433   PLT 83 (L) 07/18/2020 2216   PLT 375 07/06/2020 0433   MCV 89.1 07/18/2020 2216   MCV 87 07/06/2020 0433   MCH 27.9 07/18/2020 2216   MCHC 31.4 07/18/2020 2216   RDW 19.8 (H) 07/18/2020 2216   RDW 15.8 (H) 07/06/2020 0433   LYMPHSABS 1.0 07/06/2020 0433   MONOABS 1.2 (H) 07/04/2020 0404   EOSABS 0.3 07/06/2020 0433   BASOSABS 0.0 07/06/2020 0433    CMP Latest Ref Rng & Units 07/18/2020 07/06/2020 07/05/2020  Glucose 70 - 99 mg/dL 84 81 90  BUN 6 - 20 mg/dL 26(H) 43(H) 29(H)  Creatinine 0.61 - 1.24 mg/dL 5.00(H) 9.88(H) 7.54(H)  Sodium 135 - 145 mmol/L 137 131(L) 134(L)  Potassium 3.5 - 5.1 mmol/L 4.3 4.7 4.3  Chloride 98 - 111 mmol/L 98 91(L) 94(L)  CO2 22 - 32 mmol/L 28 26 29   Calcium 8.9 - 10.3 mg/dL 7.5(L) 8.7(L) 8.4(L)  Total Protein 6.5 - 8.1 g/dL 7.6 - -  Total Bilirubin 0.3 - 1.2 mg/dL 0.7 - -  Alkaline Phos 38 - 126 U/L 78 - -  AST 15 - 41 U/L 65(H) - -  ALT 0 - 44 U/L 74(H) - -      Microbiology: Recent Results (from the past 240 hour(s))  Respiratory Panel by RT PCR (Flu A&B, Covid) - Nasopharyngeal Swab     Status: None   Collection Time: 07/19/20  6:20 AM   Specimen: Nasopharyngeal Swab  Result Value Ref Range Status   SARS Coronavirus 2 by RT PCR NEGATIVE NEGATIVE Final    Comment: (NOTE) SARS-CoV-2 target nucleic acids are NOT  DETECTED.  The SARS-CoV-2 RNA is generally detectable in upper respiratoy specimens during the acute phase of infection. The lowest concentration of SARS-CoV-2 viral copies this assay can detect is 131 copies/mL. A negative result does not preclude SARS-Cov-2 infection and should not be used as the sole basis for treatment or other patient management decisions. A negative result may occur with  improper specimen collection/handling, submission of specimen other than nasopharyngeal swab, presence of viral mutation(s) within the areas targeted by this assay, and inadequate number of viral copies (<131 copies/mL). A negative result must be combined with clinical observations, patient history, and epidemiological information. The expected result is Negative.  Fact Sheet for Patients:  PinkCheek.be  Fact Sheet for Healthcare Providers:  GravelBags.it  This test is no t yet approved or cleared by the [place] FDA and  has been authorized for detection and/or diagnosis of SARS-CoV-2 by FDA under an Emergency Use Authorization (EUA). This EUA will remain  in effect (meaning this test can be  used) for the duration of the COVID-19 declaration under Section 564(b)(1) of the Act, 21 U.S.C. section 360bbb-3(b)(1), unless the authorization is terminated or revoked sooner.     Influenza A by PCR NEGATIVE NEGATIVE Final   Influenza B by PCR NEGATIVE NEGATIVE Final    Comment: (NOTE) The Xpert Xpress SARS-CoV-2/FLU/RSV assay is intended as an aid in  the diagnosis of influenza from Nasopharyngeal swab specimens and  should not be used as a sole basis for treatment. Nasal washings and  aspirates are unacceptable for Xpert Xpress SARS-CoV-2/FLU/RSV  testing.  Fact Sheet for Patients: PinkCheek.be  Fact Sheet for Healthcare Providers: GravelBags.it  This test is not yet  approved or cleared by the [place] FDA and  has been authorized for detection and/or diagnosis of SARS-CoV-2 by  FDA under an Emergency Use Authorization (EUA). This EUA will remain  in effect (meaning this test can be used) for the duration of the  Covid-19 declaration under Section 564(b)(1) of the Act, 21  U.S.C. section 360bbb-3(b)(1), unless the authorization is  terminated or revoked. Performed at [facility], [address] [place], [place] [address]     IMAGING RESULTS: I have personally reviewed the films ? Impression/Recommendation Presenting with rt upper quadrant pain of 4 days and dysentery. The latter has resolved 48 hrs ago  Concern for food borne illness as it started after a [employer] meal and apparently he had loose stool with blood- none in the past 48 hrs- hence will just observe Enterohemorrhagic ecoli 0157 is remotely possible - will send stool for test ( but he has no loose stools now) Not septic- agree with not giving any antibiotics now Unlikely to be Cdiff  Rt upper quadrant pain With gall stones need to r/o pain due to cholithiasis No evidence of cholecysttitis/or ascending cholangitis Other D.D would be CMV colitis and cholangitis    Anemia/thrmbocytopenia- if HUS Is a concern need to look at  peripheral smear to look for schistocytes? Will Dc bactrim as risk for bone marrow suppression ? Had HB of 6.4 during last admission and received PRBC as well Will check for parvovirus  AIDS- on symtuza ( darunavir, cobicistat, TAF, 3 tC)  Uncontrolled HTN  ESRD  ___________________________________________________ Discussed with patient, requesting provider Dr.Fitzgerald will follow him tomorrow Note:  This document was prepared using Dragon voice recognition software and may include unintentional dictation errors.

## 2020-07-19 NOTE — Progress Notes (Signed)
[facility], [place] 07/19/20  Subjective:   LOS: 0  Patient  Came to the ED with c/o RUQ abdominal pain which started 4 days ago after eating food from [employer].CT scan shows Cholelithiasis. He has h/o ESRD on dialysis MWF.  Patient reports getting a full dialysis treatment at his outpatient clinic yesterday.      Objective:  Vital signs in last 24 hours:  Temp:  [98 F (36.7 C)-99.2 F (37.3 C)] 98.2 F (36.8 C) (10/14 1303) Pulse Rate:  [83-103] 84 (10/14 1230) Resp:  [16-20] 16 (10/14 1303) BP: (162-179)/(100-140) 162/129 (10/14 1303) SpO2:  [96 %-100 %] 100 % (10/14 1303) Weight:  [56.7 kg] 56.7 kg (10/13 2215)  Weight change:  Filed Weights   07/18/20 2215  Weight: 56.7 kg    Intake/Output:    Intake/Output Summary (Last 24 hours) at 07/19/2020 1347 Last data filed at 07/19/2020 1305 Gross per 24 hour  Intake 660 ml  Output --  Net 660 ml    Physical Exam: General:  In no acute distress  HEENT  Normocephalic,atraumatic  Pulm/lungs   Normal and symmetrical respiratory effort, lungs clear  CVS/Heart  S1S2, tachycardic, regular  Abdomen:   Soft, Tenderness + on RUQ  Extremities:  No peripheral edema  Neurologic:  Alert, oriented x3  Skin:  No acute rashes or lesions  Right IJ PermCath     Basic Metabolic Panel:  Recent Labs  Lab 07/18/20 2216  NA 137  K 4.3  CL 98  CO2 28  GLUCOSE 84  BUN 26*  CREATININE 5.00*  CALCIUM 7.5*     CBC: Recent Labs  Lab 07/18/20 2216  WBC 6.7  HGB 6.9*  HCT 22.0*  MCV 89.1  PLT 83*      Lab Results  Component Value Date   HEPBSAG NON REACTIVE 06/17/2020   HEPBIGM NON REACTIVE 06/17/2020      Microbiology:  Recent Results (from the past 240 hour(s))  Respiratory Panel by RT PCR (Flu A&B, Covid) - Nasopharyngeal Swab     Status: None   Collection Time: 07/19/20  6:20 AM   Specimen: Nasopharyngeal Swab  Result Value Ref Range Status   SARS Coronavirus 2 by  RT PCR NEGATIVE NEGATIVE Final    Comment: (NOTE) SARS-CoV-2 target nucleic acids are NOT DETECTED.  The SARS-CoV-2 RNA is generally detectable in upper respiratoy specimens during the acute phase of infection. The lowest concentration of SARS-CoV-2 viral copies this assay can detect is 131 copies/mL. A negative result does not preclude SARS-Cov-2 infection and should not be used as the sole basis for treatment or other patient management decisions. A negative result may occur with  improper specimen collection/handling, submission of specimen other than nasopharyngeal swab, presence of viral mutation(s) within the areas targeted by this assay, and inadequate number of viral copies (<131 copies/mL). A negative result must be combined with clinical observations, patient history, and epidemiological information. The expected result is Negative.  Fact Sheet for Patients:  PinkCheek.be  Fact Sheet for Healthcare Providers:  GravelBags.it  This test is no t yet approved or cleared by the [place] FDA and  has been authorized for detection and/or diagnosis of SARS-CoV-2 by FDA under an Emergency Use Authorization (EUA). This EUA will remain  in effect (meaning this test can be used) for the duration of the COVID-19 declaration under Section 564(b)(1) of the Act, 21 U.S.C. section 360bbb-3(b)(1), unless the authorization is terminated or revoked sooner.  Influenza A by PCR NEGATIVE NEGATIVE Final   Influenza B by PCR NEGATIVE NEGATIVE Final    Comment: (NOTE) The Xpert Xpress SARS-CoV-2/FLU/RSV assay is intended as an aid in  the diagnosis of influenza from Nasopharyngeal swab specimens and  should not be used as a sole basis for treatment. Nasal washings and  aspirates are unacceptable for Xpert Xpress SARS-CoV-2/FLU/RSV  testing.  Fact Sheet for Patients: PinkCheek.be  Fact Sheet for  Healthcare Providers: GravelBags.it  This test is not yet approved or cleared by the [place] FDA and  has been authorized for detection and/or diagnosis of SARS-CoV-2 by  FDA under an Emergency Use Authorization (EUA). This EUA will remain  in effect (meaning this test can be used) for the duration of the  Covid-19 declaration under Section 564(b)(1) of the Act, 21  U.S.C. section 360bbb-3(b)(1), unless the authorization is  terminated or revoked. Performed at [facility], [address] [place], [place] [address]     Coagulation Studies: No results for input(s): LABPROT, INR in the last 72 hours.  Urinalysis: Recent Labs    07/18/20 1212  COLORURINE STRAW*  LABSPEC 1.006  PHURINE 9.0*  GLUCOSEU 50*  HGBUR NEGATIVE  BILIRUBINUR NEGATIVE  KETONESUR NEGATIVE  PROTEINUR 100*  NITRITE NEGATIVE  LEUKOCYTESUR NEGATIVE      Imaging: CT ABDOMEN PELVIS WO CONTRAST  Result Date: 07/19/2020 CLINICAL DATA:  Right-sided abdominal pain for 4 days. EXAM: CT ABDOMEN AND PELVIS WITHOUT CONTRAST TECHNIQUE: Multidetector CT imaging of the abdomen and pelvis was performed following the standard protocol without IV contrast. COMPARISON:  None. FINDINGS: Lower chest: Small right pleural effusion. Heart is enlarged. Atelectasis noted at the right base. Catheter tip projects in the right atrium. Hepatobiliary: No focal abnormality in the liver on this study without intravenous contrast. Layering tiny calcified gallstones evident. No intrahepatic or extrahepatic biliary dilation. Pancreas: No focal mass lesion. No dilatation of the main duct. No intraparenchymal cyst. No peripancreatic edema. Spleen: No splenomegaly. No focal mass lesion. Adrenals/Urinary Tract: No adrenal nodule or mass. Kidneys appear atrophic. No evidence for hydroureter The urinary bladder appears normal for the degree of distention. Stomach/Bowel: Stomach is unremarkable. No gastric  wall thickening. No evidence of outlet obstruction. Study is limited by lack of oral and intravenous contrast, but there appears to be a proximal small bowel loop, potentially distal duodenum and proximal jejunum in the medial left upper quadrant demonstrating substantial circumferential wall thickening and edema, likely with confluent perienteric edema (see axial 33/2 and coronal 38/5. No small bowel dilatation. The terminal ileum is normal. The appendix is normal. Prominent colonic stool volume. Vascular/Lymphatic: No abdominal aortic aneurysm. No evidence for abdominal lymphadenopathy on this noncontrast study. No pelvic sidewall lymphadenopathy. Reproductive: The prostate gland and seminal vesicles are unremarkable. Other: Small volume free fluid noted in the pelvis. Diffuse body wall edema evident. Musculoskeletal: No worrisome lytic or sclerotic osseous abnormality. IMPRESSION: 1. Limited study due to lack of oral and intravenous contrast. Abnormal proximal small bowel loops, likely distal duodenum and proximal jejunum in the medial left upper quadrant demonstrates substantial circumferential wall thickening with perienteric edema. Imaging features compatible with infectious/inflammatory enteritis. Other etiologies such as ulcerative disease or ischemia are considered less likely, but not excluded. 2. Small volume free fluid in the pelvis with diffuse body wall edema. 3. Cholelithiasis. 4. Small right pleural effusion. 5. Atrophic kidneys. 6. Prominent colonic stool volume. Imaging features could be compatible with clinical constipation. Electronically Signed   By: Randall Hiss  Tery Sanfilippo M.D.   On: 07/19/2020 05:17   US Abdomen Limited RUQ  Result Date: 07/19/2020 CLINICAL DATA:  Right upper quadrant abdominal pain EXAM: ULTRASOUND ABDOMEN LIMITED RIGHT UPPER QUADRANT COMPARISON:  None. FINDINGS: Gallbladder: The gallbladder contains numerous small, layering mobile gallstones and sludge. The gallbladder, however,  is not distended, there is no gallbladder wall thickening, and no pericholecystic fluid is identified. The sonographic Percell Miller sign is reportedly negative. Common bile duct: Diameter: 4 mm in proximal diameter Liver: No focal lesion identified. Within normal limits in parenchymal echogenicity. Portal vein is patent on color Doppler imaging with normal direction of blood flow towards the liver. Other: Trace right pleural effusion and perihepatic free fluid is noted. The right kidney is markedly echogenic in keeping with changes of underlying medical renal disease. This is not fully assessed on this examination. IMPRESSION: Cholelithiasis without sonographic evidence of acute cholecystitis. Trace right pleural effusion and perihepatic ascites Diffusely increased renal cortical echogenicity in keeping with underlying medical renal disease. Electronically Signed   By: Fidela Salisbury MD   On: 07/19/2020 01:05     Medications:    . sodium chloride   Intravenous Once  . amLODipine  10 mg Oral Daily  . calcium-vitamin D  1 tablet Oral TID  . carvedilol  25 mg Oral BID WC  . [START ON 07/25/2020] Darbepoetin Alfa  25 mcg Subcutaneous Q7 days  . Darunavir-Cobicisctat-Emtricitabine-Tenofovir Alafenamide  1 tablet Oral Q supper  . ferrous sulfate  18 mg of iron Oral Daily  . hydrALAZINE  50 mg Oral Q8H  . multivitamin  1 tablet Oral QHS  . sulfamethoxazole-trimethoprim  1 tablet Oral Daily   HYDROmorphone (DILAUDID) injection, ondansetron **OR** ondansetron (ZOFRAN) IV  Assessment/ Plan:  29 y.o. male with longstanding hypertension, chronic kidney disease, anemia of chronic kidney disease, secondary hyperparathyroidism,HIV  was admitted on 07/19/2020 for RUQ Abdominal pain.  Principal Problem:   Enteritis Active Problems:   ESRD (end stage renal disease) ([place])   Anemia in ESRD (end-stage renal disease) ([facility])   HIV (human immunodeficiency virus infection) ([place])   Cholelithiasis   Thrombocytopenia  ([facility])  Enteritis [K52.9]  #. 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 Anemia of CKD  Lab Results  Component Value Date   HGB 6.9 (L) 07/18/2020  on Darbepoetin Alfa weekly -Oral Iron supplements   #. Secondary hyperparathyroidism of renal origin N 25.81      Component Value Date/Time   PTH 654 (H) 06/17/2020 2250   Lab Results  Component Value Date   PHOS 6.2 (H) 06/27/2020   -Will restart home Phosphorus binders when acute abdominal issues resolve    #.  RUQ Abdominal Pain CT Scan  IMPRESSION: 1. Limited study due to lack of oral and intravenous contrast. Abnormal proximal small bowel loops, likely distal duodenum and proximal jejunum in the medial left upper quadrant demonstrates substantial circumferential wall thickening with perienteric edema. Imaging features compatible with infectious/inflammatory enteritis. Other etiologies such as ulcerative disease or ischemia are considered less likely, but not excluded. 2. Small volume free fluid in the pelvis with diffuse body wall edema. 3. Cholelithiasis. 4. Small right pleural effusion. 5. Atrophic kidneys. 6. Prominent colonic stool volume. Imaging features could be compatible  with clinical constipation.   Planning for hepatoscan today   LOS: 0 Crosby Oyster 10/14/20211:47 PM  [facility] [place], [place]

## 2020-07-19 NOTE — ED Notes (Signed)
One  unit of blood transfused without s/sx of reaction.  Pt tolerated well.

## 2020-07-20 ENCOUNTER — Inpatient Hospital Stay: Payer: Medicaid Other | Admitting: Cardiothoracic Surgery

## 2020-07-20 DIAGNOSIS — F17210 Nicotine dependence, cigarettes, uncomplicated: Secondary | ICD-10-CM | POA: Diagnosis present

## 2020-07-20 DIAGNOSIS — Z79899 Other long term (current) drug therapy: Secondary | ICD-10-CM | POA: Diagnosis not present

## 2020-07-20 DIAGNOSIS — D696 Thrombocytopenia, unspecified: Secondary | ICD-10-CM | POA: Diagnosis present

## 2020-07-20 DIAGNOSIS — N186 End stage renal disease: Secondary | ICD-10-CM | POA: Diagnosis present

## 2020-07-20 DIAGNOSIS — K529 Noninfective gastroenteritis and colitis, unspecified: Secondary | ICD-10-CM | POA: Diagnosis present

## 2020-07-20 DIAGNOSIS — A09 Infectious gastroenteritis and colitis, unspecified: Secondary | ICD-10-CM | POA: Diagnosis not present

## 2020-07-20 DIAGNOSIS — B20 Human immunodeficiency virus [HIV] disease: Secondary | ICD-10-CM | POA: Diagnosis present

## 2020-07-20 DIAGNOSIS — R1011 Right upper quadrant pain: Secondary | ICD-10-CM | POA: Diagnosis not present

## 2020-07-20 DIAGNOSIS — I160 Hypertensive urgency: Secondary | ICD-10-CM

## 2020-07-20 DIAGNOSIS — E875 Hyperkalemia: Secondary | ICD-10-CM | POA: Diagnosis present

## 2020-07-20 DIAGNOSIS — Z833 Family history of diabetes mellitus: Secondary | ICD-10-CM | POA: Diagnosis not present

## 2020-07-20 DIAGNOSIS — K8020 Calculus of gallbladder without cholecystitis without obstruction: Secondary | ICD-10-CM | POA: Diagnosis present

## 2020-07-20 DIAGNOSIS — D631 Anemia in chronic kidney disease: Secondary | ICD-10-CM | POA: Diagnosis present

## 2020-07-20 DIAGNOSIS — Z20822 Contact with and (suspected) exposure to covid-19: Secondary | ICD-10-CM | POA: Diagnosis present

## 2020-07-20 DIAGNOSIS — I120 Hypertensive chronic kidney disease with stage 5 chronic kidney disease or end stage renal disease: Secondary | ICD-10-CM | POA: Diagnosis present

## 2020-07-20 DIAGNOSIS — Z8249 Family history of ischemic heart disease and other diseases of the circulatory system: Secondary | ICD-10-CM | POA: Diagnosis not present

## 2020-07-20 DIAGNOSIS — I10 Essential (primary) hypertension: Secondary | ICD-10-CM | POA: Diagnosis present

## 2020-07-20 DIAGNOSIS — Z992 Dependence on renal dialysis: Secondary | ICD-10-CM | POA: Diagnosis not present

## 2020-07-20 LAB — COMPREHENSIVE METABOLIC PANEL
ALT: 58 U/L — ABNORMAL HIGH (ref 0–44)
AST: 52 U/L — ABNORMAL HIGH (ref 15–41)
Albumin: 2.9 g/dL — ABNORMAL LOW (ref 3.5–5.0)
Alkaline Phosphatase: 78 U/L (ref 38–126)
Anion gap: 12 (ref 5–15)
BUN: 50 mg/dL — ABNORMAL HIGH (ref 6–20)
CO2: 23 mmol/L (ref 22–32)
Calcium: 8.3 mg/dL — ABNORMAL LOW (ref 8.9–10.3)
Chloride: 98 mmol/L (ref 98–111)
Creatinine, Ser: 8.06 mg/dL — ABNORMAL HIGH (ref 0.61–1.24)
GFR, Estimated: 8 mL/min — ABNORMAL LOW (ref >60)
Glucose, Bld: 93 mg/dL (ref 70–99)
Potassium: 5.2 mmol/L — ABNORMAL HIGH (ref 3.5–5.1)
Sodium: 133 mmol/L — ABNORMAL LOW (ref 135–145)
Total Bilirubin: 0.7 mg/dL (ref 0.3–1.2)
Total Protein: 7.6 g/dL (ref 6.5–8.1)

## 2020-07-20 LAB — PROTIME-INR
INR: 1 (ref 0.8–1.2)
Prothrombin Time: 12.9 seconds (ref 11.4–15.2)

## 2020-07-20 LAB — BPAM RBC
Blood Product Expiration Date: 202110142359
ISSUE DATE / TIME: 202110141054
Unit Type and Rh: 9500

## 2020-07-20 LAB — CBC WITH DIFFERENTIAL/PLATELET
Abs Immature Granulocytes: 0.04 10*3/uL (ref 0.00–0.07)
Basophils Absolute: 0 10*3/uL (ref 0.0–0.1)
Basophils Relative: 1 %
Eosinophils Absolute: 1 10*3/uL — ABNORMAL HIGH (ref 0.0–0.5)
Eosinophils Relative: 11 %
HCT: 27.4 % — ABNORMAL LOW (ref 39.0–52.0)
Hemoglobin: 8.7 g/dL — ABNORMAL LOW (ref 13.0–17.0)
Immature Granulocytes: 1 %
Lymphocytes Relative: 13 %
Lymphs Abs: 1.1 10*3/uL (ref 0.7–4.0)
MCH: 28.1 pg (ref 26.0–34.0)
MCHC: 31.8 g/dL (ref 30.0–36.0)
MCV: 88.4 fL (ref 80.0–100.0)
Monocytes Absolute: 1.1 10*3/uL — ABNORMAL HIGH (ref 0.1–1.0)
Monocytes Relative: 13 %
Neutro Abs: 5.3 10*3/uL (ref 1.7–7.7)
Neutrophils Relative %: 61 %
Platelets: 98 10*3/uL — ABNORMAL LOW (ref 150–400)
RBC: 3.1 MIL/uL — ABNORMAL LOW (ref 4.22–5.81)
RDW: 19.5 % — ABNORMAL HIGH (ref 11.5–15.5)
WBC: 8.6 10*3/uL (ref 4.0–10.5)
nRBC: 0 % (ref 0.0–0.2)

## 2020-07-20 LAB — LACTIC ACID, PLASMA
Lactic Acid, Venous: 0.8 mmol/L (ref 0.5–1.9)
Lactic Acid, Venous: 0.8 mmol/L (ref 0.5–1.9)

## 2020-07-20 LAB — TYPE AND SCREEN
ABO/RH(D): A POS
Antibody Screen: NEGATIVE
Unit division: 0

## 2020-07-20 LAB — HEPATITIS B SURFACE ANTIGEN: Hepatitis B Surface Ag: NONREACTIVE

## 2020-07-20 LAB — APTT: aPTT: 33 seconds (ref 24–36)

## 2020-07-20 MED ORDER — CHLORHEXIDINE GLUCONATE CLOTH 2 % EX PADS
6.0000 | MEDICATED_PAD | Freq: Every day | CUTANEOUS | Status: DC
  Administered 2020-07-20 – 2020-07-21 (×2): 6 via TOPICAL

## 2020-07-20 MED ORDER — CLONIDINE HCL 0.1 MG PO TABS
0.2000 mg | ORAL_TABLET | Freq: Once | ORAL | Status: AC
  Administered 2020-07-20: 0.2 mg via ORAL

## 2020-07-20 MED ORDER — CIPROFLOXACIN HCL 500 MG PO TABS
500.0000 mg | ORAL_TABLET | ORAL | Status: DC
  Administered 2020-07-20: 500 mg via ORAL
  Filled 2020-07-20 (×3): qty 1

## 2020-07-20 MED ORDER — METRONIDAZOLE 500 MG PO TABS
500.0000 mg | ORAL_TABLET | Freq: Three times a day (TID) | ORAL | Status: DC
  Administered 2020-07-20 – 2020-07-21 (×3): 500 mg via ORAL
  Filled 2020-07-20 (×11): qty 1

## 2020-07-20 MED ORDER — CLONIDINE HCL 0.1 MG PO TABS
0.2000 mg | ORAL_TABLET | Freq: Two times a day (BID) | ORAL | Status: DC
  Administered 2020-07-20: 0.2 mg via ORAL
  Filled 2020-07-20: qty 2

## 2020-07-20 MED ORDER — CIPROFLOXACIN HCL 500 MG PO TABS: 250.0000 mg | ORAL_TABLET | Freq: Two times a day (BID) | ORAL | Status: DC

## 2020-07-20 NOTE — Progress Notes (Signed)
[facility] INFECTIOUS DISEASE PROGRESS NOTE Date of Admission:  07/19/2020     ID: Shan Valdes is a 29 y.o. male with HIV, abd pain, diarrhea Principal Problem:   Enteritis Active Problems:   ESRD (end stage renal disease) ([place])   Anemia in ESRD (end-stage renal disease) ([place])   AIDS ([place])   Cholelithiasis   Thrombocytopenia (H[facility])   Accelerated hypertension   RUQ pain   Hypertensive urgency   Subjective: Diarrhea better, but still with abd pain on R. Tolerating clear diet. no fever, wbc 8.6.   ROS  Eleven systems are reviewed and negative except per hpi  Medications:  Antibiotics Given (last 72 hours)    Date/Time Action Medication Dose   07/19/20 1452 Given  [pt refused at scheduled time]   sulfamethoxazole-trimethoprim (BACTRIM) 400-80 MG per tablet 1 tablet 1 tablet     . sodium chloride   Intravenous Once  . amLODipine  10 mg Oral Daily  . calcium-vitamin D  1 tablet Oral TID  . carvedilol  25 mg Oral BID WC  . Chlorhexidine Gluconate Cloth  6 each Topical Daily  . cloNIDine  0.2 mg Oral BID  . Darunavir-Cobicisctat-Emtricitabine-Tenofovir Alafenamide  1 tablet Oral Q supper  . epoetin (EPOGEN/PROCRIT) injection  10,000 Units Intravenous Q M,W,F-HD  . ferrous sulfate  18 mg of iron Oral Daily  . hydrALAZINE  100 mg Oral Q8H  . multivitamin  1 tablet Oral QHS    Objective: Vital signs in last 24 hours: Temp:  [97.9 F (36.6 C)-99.8 F (37.7 C)] 99 F (37.2 C) (10/15 1300) Pulse Rate:  [86-120] 110 (10/15 1300) Resp:  [15-19] 18 (10/15 1300) BP: (147-177)/(111-135) 154/111 (10/15 1300) SpO2:  [93 %-100 %] 98 % (10/15 1300) Physical Exam  Constitutional: He is oriented to person, place, and time. Thin HENT: anicteric Mouth/Throat: Oropharynx is clear and moist. Cardiovascular: Normal rate, regular rhythm and normal heart sounds. Pulmonary/Chest: Effort normal and breath sounds normal.  Abdominal: Soft. Bowel sounds are normal. He exhibits no  distension. There is midl ttp Rupper and Lower quadrant  Lymphadenopathy: He has no cervical adenopathy.  Neurological: He is alert and oriented to person, place, and time.  Skin: Skin is warm and dry. No rash noted. No erythema.  Psychiatric: He has a normal mood and affect. His behavior is normal.     Lab Results Recent Labs    07/18/20 2216 07/18/20 2216 07/19/20 1851 07/20/20 0544  WBC 6.7   < > 7.6 8.6  HGB 6.9*   < > 8.3* 8.7*  HCT 22.0*   < > 25.9* 27.4*  NA 137  --   --  133*  K 4.3  --   --  5.2*  CL 98  --   --  98  CO2 28  --   --  23  BUN 26*  --   --  50*  CREATININE 5.00*  --   --  8.06*   < > = values in this interval not displayed.    Microbiology: Results for orders placed or performed during the hospital encounter of 07/19/20  Respiratory Panel by RT PCR (Flu A&B, Covid) - Nasopharyngeal Swab     Status: None   Collection Time: 07/19/20  6:20 AM   Specimen: Nasopharyngeal Swab  Result Value Ref Range Status   SARS Coronavirus 2 by RT PCR NEGATIVE NEGATIVE Final    Comment: (NOTE) SARS-CoV-2 target nucleic acids are NOT DETECTED.  The SARS-CoV-2 RNA is generally  detectable in upper respiratoy specimens during the acute phase of infection. The lowest concentration of SARS-CoV-2 viral copies this assay can detect is 131 copies/mL. A negative result does not preclude SARS-Cov-2 infection and should not be used as the sole basis for treatment or other patient management decisions. A negative result may occur with  improper specimen collection/handling, submission of specimen other than nasopharyngeal swab, presence of viral mutation(s) within the areas targeted by this assay, and inadequate number of viral copies (<131 copies/mL). A negative result must be combined with clinical observations, patient history, and epidemiological information. The expected result is Negative.  Fact Sheet for Patients:  PinkCheek.be  Fact  Sheet for Healthcare Providers:  GravelBags.it  This test is no t yet approved or cleared by the [place] FDA and  has been authorized for detection and/or diagnosis of SARS-CoV-2 by FDA under an Emergency Use Authorization (EUA). This EUA will remain  in effect (meaning this test can be used) for the duration of the COVID-19 declaration under Section 564(b)(1) of the Act, 21 U.S.C. section 360bbb-3(b)(1), unless the authorization is terminated or revoked sooner.     Influenza A by PCR NEGATIVE NEGATIVE Final   Influenza B by PCR NEGATIVE NEGATIVE Final    Comment: (NOTE) The Xpert Xpress SARS-CoV-2/FLU/RSV assay is intended as an aid in  the diagnosis of influenza from Nasopharyngeal swab specimens and  should not be used as a sole basis for treatment. Nasal washings and  aspirates are unacceptable for Xpert Xpress SARS-CoV-2/FLU/RSV  testing.  Fact Sheet for Patients: PinkCheek.be  Fact Sheet for Healthcare Providers: GravelBags.it  This test is not yet approved or cleared by the [place] FDA and  has been authorized for detection and/or diagnosis of SARS-CoV-2 by  FDA under an Emergency Use Authorization (EUA). This EUA will remain  in effect (meaning this test can be used) for the duration of the  Covid-19 declaration under Section 564(b)(1) of the Act, 21  U.S.C. section 360bbb-3(b)(1), unless the authorization is  terminated or revoked. Performed at [facility], [address] [place], [place] [address]     Studies/Results: CT ABDOMEN PELVIS WO CONTRAST  Result Date: 07/19/2020 CLINICAL DATA:  Right-sided abdominal pain for 4 days. EXAM: CT ABDOMEN AND PELVIS WITHOUT CONTRAST TECHNIQUE: Multidetector CT imaging of the abdomen and pelvis was performed following the standard protocol without IV contrast. COMPARISON:  None. FINDINGS: Lower chest: Small right pleural  effusion. Heart is enlarged. Atelectasis noted at the right base. Catheter tip projects in the right atrium. Hepatobiliary: No focal abnormality in the liver on this study without intravenous contrast. Layering tiny calcified gallstones evident. No intrahepatic or extrahepatic biliary dilation. Pancreas: No focal mass lesion. No dilatation of the main duct. No intraparenchymal cyst. No peripancreatic edema. Spleen: No splenomegaly. No focal mass lesion. Adrenals/Urinary Tract: No adrenal nodule or mass. Kidneys appear atrophic. No evidence for hydroureter The urinary bladder appears normal for the degree of distention. Stomach/Bowel: Stomach is unremarkable. No gastric wall thickening. No evidence of outlet obstruction. Study is limited by lack of oral and intravenous contrast, but there appears to be a proximal small bowel loop, potentially distal duodenum and proximal jejunum in the medial left upper quadrant demonstrating substantial circumferential wall thickening and edema, likely with confluent perienteric edema (see axial 33/2 and coronal 38/5. No small bowel dilatation. The terminal ileum is normal. The appendix is normal. Prominent colonic stool volume. Vascular/Lymphatic: No abdominal aortic aneurysm. No evidence for abdominal lymphadenopathy on this  noncontrast study. No pelvic sidewall lymphadenopathy. Reproductive: The prostate gland and seminal vesicles are unremarkable. Other: Small volume free fluid noted in the pelvis. Diffuse body wall edema evident. Musculoskeletal: No worrisome lytic or sclerotic osseous abnormality. IMPRESSION: 1. Limited study due to lack of oral and intravenous contrast. Abnormal proximal small bowel loops, likely distal duodenum and proximal jejunum in the medial left upper quadrant demonstrates substantial circumferential wall thickening with perienteric edema. Imaging features compatible with infectious/inflammatory enteritis. Other etiologies such as ulcerative disease or  ischemia are considered less likely, but not excluded. 2. Small volume free fluid in the pelvis with diffuse body wall edema. 3. Cholelithiasis. 4. Small right pleural effusion. 5. Atrophic kidneys. 6. Prominent colonic stool volume. Imaging features could be compatible with clinical constipation. Electronically Signed   By: Misty Stanley M.D.   On: 07/19/2020 05:17   NM Hepatobiliary Liver Func  Result Date: 07/19/2020 CLINICAL DATA:  Cholelithiasis EXAM: NUCLEAR MEDICINE HEPATOBILIARY IMAGING TECHNIQUE: Sequential images of the abdomen were obtained out to 60 minutes following intravenous administration of radiopharmaceutical. RADIOPHARMACEUTICALS:  5.349 mCi Tc-28m  Choletec IV COMPARISON:  CT 07/19/2020, ultrasound 07/19/2020 FINDINGS: Prompt uptake and biliary excretion of activity by the liver is seen. Gallbladder activity is visualized, consistent with patency of cystic duct. Biliary activity passes into small bowel, consistent with patent common bile duct. Activity remains within the gallbladder at the 60 minutes of imaging. A Scholl order requisition included in ejection fraction measurement however patient electively terminated the exam prematurely secondary to abdominal pain. IMPRESSION: Normal uptake and excretion with patency of the cystic and common bile duct as above. Patient electively terminated the exam prior to the second hour of imaging for ejection fraction measurement. Electronically Signed   By: Lovena Le M.D.   On: 07/19/2020 15:09   US Abdomen Limited RUQ  Result Date: 07/19/2020 CLINICAL DATA:  Right upper quadrant abdominal pain EXAM: ULTRASOUND ABDOMEN LIMITED RIGHT UPPER QUADRANT COMPARISON:  None. FINDINGS: Gallbladder: The gallbladder contains numerous small, layering mobile gallstones and sludge. The gallbladder, however, is not distended, there is no gallbladder wall thickening, and no pericholecystic fluid is identified. The sonographic Percell Miller sign is reportedly  negative. Common bile duct: Diameter: 4 mm in proximal diameter Liver: No focal lesion identified. Within normal limits in parenchymal echogenicity. Portal vein is patent on color Doppler imaging with normal direction of blood flow towards the liver. Other: Trace right pleural effusion and perihepatic free fluid is noted. The right kidney is markedly echogenic in keeping with changes of underlying medical renal disease. This is not fully assessed on this examination. IMPRESSION: Cholelithiasis without sonographic evidence of acute cholecystitis. Trace right pleural effusion and perihepatic ascites Diffusely increased renal cortical echogenicity in keeping with underlying medical renal disease. Electronically Signed   By: Fidela Salisbury MD   On: 07/19/2020 01:05    Assessment/Plan: Presenting with rt upper quadrant pain of 4 days and dysentery. The latter has resolved 48 hrs ago. Unclear etiology. CT with possible small bowel thickening but is in the LUQ and his pain is RUQ. Unable to send stool sample as no more diarrhea Has been seen by surgery - does have gall stones but No evidence of cholecysttitis/or ascending cholangitis Hida negative Would start cipro flagyl x 3 days for the enteritis. If worsens needs to see GI and have CT enterography or capsule endoscopy considered. If has stool that is liquid send for Stool PCR test   Anemia/thrmbocytopenia- if HUS Is a concern need to look  at  peripheral smear to look for schistocytes? Will Dc bactrim as risk for bone marrow suppression ? Had HB of 6.4 during last admission and received PRBC as well Will check for parvovirus  AIDS- on symtuza ( darunavir, cobicistat, TAF, 3 tC)  Uncontrolled HTN  ESRD recently started on HD   Leonel Ramsay   07/20/2020, 3:15 PM

## 2020-07-20 NOTE — Progress Notes (Signed)
Patient ID: Daniel Combs, male   DOB: 1991-07-04, 29 y.o.   MRN: [id number] [facility] PROGRESS NOTE  Daniel Combs WGN:[id number] DOB: 06/11/1991 DOA: 07/19/2020 PCP: Pcp, No  HPI/Subjective: Patient came in with constant right upper quadrant pain.  Little bit better after pain medications.  Had diarrhea prior to coming into the hospital but no diarrhea in 2 days.  Positive for itching but no rash.  No nausea or vomiting.  Patient states that he is not, stay in the hospital too long.  Objective: Vitals:   07/20/20 0834 07/20/20 0949  BP: (!) 147/124 (!) 172/129  Pulse: (!) 120 (!) 118  Resp: 15 19  Temp: 99.6 F (37.6 C) 99 F (37.2 C)  SpO2: 95%     Intake/Output Summary (Last 24 hours) at 07/20/2020 1257 Last data filed at 07/19/2020 1305 Gross per 24 hour  Intake 660 ml  Output --  Net 660 ml   Filed Weights   07/18/20 2215  Weight: 56.7 kg    ROS: Review of Systems  Respiratory: Negative for cough and shortness of breath.   Cardiovascular: Negative for chest pain.  Gastrointestinal: Positive for abdominal pain. Negative for diarrhea, nausea and vomiting.  Skin: Positive for itching. Negative for rash.   Exam: Physical Exam HENT:     Head: Normocephalic.     Mouth/Throat:     Pharynx: No oropharyngeal exudate.  Eyes:     General: Lids are normal.     Conjunctiva/sclera: Conjunctivae normal.     Pupils: Pupils are equal, round, and reactive to light.  Cardiovascular:     Rate and Rhythm: Normal rate and regular rhythm.     Heart sounds: Normal heart sounds, S1 normal and S2 normal.  Pulmonary:     Breath sounds: No decreased breath sounds, wheezing, rhonchi or rales.  Abdominal:     Palpations: Abdomen is soft.     Tenderness: There is abdominal tenderness in the right upper quadrant.  Musculoskeletal:     Right lower leg: No swelling.     Left lower leg: No swelling.  Skin:    General: Skin is warm.     Findings: No lesion.   Neurological:     Mental Status: He is alert and oriented to person, place, and time.       Data Reviewed: Basic Metabolic Panel: Recent Labs  Lab 07/18/20 2216 07/20/20 0544  NA 137 133*  K 4.3 5.2*  CL 98 98  CO2 28 23  GLUCOSE 84 93  BUN 26* 50*  CREATININE 5.00* 8.06*  CALCIUM 7.5* 8.3*   Liver Function Tests: Recent Labs  Lab 07/18/20 2216 07/20/20 0544  AST 65* 52*  ALT 74* 58*  ALKPHOS 78 78  BILITOT 0.7 0.7  PROT 7.6 7.6  ALBUMIN 2.7* 2.9*   Recent Labs  Lab 07/18/20 2216  LIPASE 41   CBC: Recent Labs  Lab 07/18/20 2216 07/19/20 1851 07/20/20 0544  WBC 6.7 7.6 8.6  NEUTROABS  --  4.3 5.3  HGB 6.9* 8.3* 8.7*  HCT 22.0* 25.9* 27.4*  MCV 89.1 88.7 88.4  PLT 83* 90* 98*   BNP (last 3 results) Recent Labs    06/16/20 0832  BNP 452.0*    CBG: Recent Labs  Lab 07/19/20 2015  GLUCAP 112*    Recent Results (from the past 240 hour(s))  Respiratory Panel by RT PCR (Flu A&B, Covid) - Nasopharyngeal Swab     Status: None   Collection Time: 07/19/20  6:20  AM   Specimen: Nasopharyngeal Swab  Result Value Ref Range Status   SARS Coronavirus 2 by RT PCR NEGATIVE NEGATIVE Final    Comment: (NOTE) SARS-CoV-2 target nucleic acids are NOT DETECTED.  The SARS-CoV-2 RNA is generally detectable in upper respiratoy specimens during the acute phase of infection. The lowest concentration of SARS-CoV-2 viral copies this assay can detect is 131 copies/mL. A negative result does not preclude SARS-Cov-2 infection and should not be used as the sole basis for treatment or other patient management decisions. A negative result may occur with  improper specimen collection/handling, submission of specimen other than nasopharyngeal swab, presence of viral mutation(s) within the areas targeted by this assay, and inadequate number of viral copies (<131 copies/mL). A negative result must be combined with clinical observations, patient history, and epidemiological  information. The expected result is Negative.  Fact Sheet for Patients:  PinkCheek.be  Fact Sheet for Healthcare Providers:  GravelBags.it  This test is no t yet approved or cleared by the [place] FDA and  has been authorized for detection and/or diagnosis of SARS-CoV-2 by FDA under an Emergency Use Authorization (EUA). This EUA will remain  in effect (meaning this test can be used) for the duration of the COVID-19 declaration under Section 564(b)(1) of the Act, 21 U.S.C. section 360bbb-3(b)(1), unless the authorization is terminated or revoked sooner.     Influenza A by PCR NEGATIVE NEGATIVE Final   Influenza B by PCR NEGATIVE NEGATIVE Final    Comment: (NOTE) The Xpert Xpress SARS-CoV-2/FLU/RSV assay is intended as an aid in  the diagnosis of influenza from Nasopharyngeal swab specimens and  should not be used as a sole basis for treatment. Nasal washings and  aspirates are unacceptable for Xpert Xpress SARS-CoV-2/FLU/RSV  testing.  Fact Sheet for Patients: PinkCheek.be  Fact Sheet for Healthcare Providers: GravelBags.it  This test is not yet approved or cleared by the [place] FDA and  has been authorized for detection and/or diagnosis of SARS-CoV-2 by  FDA under an Emergency Use Authorization (EUA). This EUA will remain  in effect (meaning this test can be used) for the duration of the  Covid-19 declaration under Section 564(b)(1) of the Act, 21  U.S.C. section 360bbb-3(b)(1), unless the authorization is  terminated or revoked. Performed at [facility], [address], [place], [place] [address]      Studies: CT ABDOMEN PELVIS WO CONTRAST  Result Date: 07/19/2020 CLINICAL DATA:  Right-sided abdominal pain for 4 days. EXAM: CT ABDOMEN AND PELVIS WITHOUT CONTRAST TECHNIQUE: Multidetector CT imaging of the abdomen and pelvis was  performed following the standard protocol without IV contrast. COMPARISON:  None. FINDINGS: Lower chest: Small right pleural effusion. Heart is enlarged. Atelectasis noted at the right base. Catheter tip projects in the right atrium. Hepatobiliary: No focal abnormality in the liver on this study without intravenous contrast. Layering tiny calcified gallstones evident. No intrahepatic or extrahepatic biliary dilation. Pancreas: No focal mass lesion. No dilatation of the main duct. No intraparenchymal cyst. No peripancreatic edema. Spleen: No splenomegaly. No focal mass lesion. Adrenals/Urinary Tract: No adrenal nodule or mass. Kidneys appear atrophic. No evidence for hydroureter The urinary bladder appears normal for the degree of distention. Stomach/Bowel: Stomach is unremarkable. No gastric wall thickening. No evidence of outlet obstruction. Study is limited by lack of oral and intravenous contrast, but there appears to be a proximal small bowel loop, potentially distal duodenum and proximal jejunum in the medial left upper quadrant demonstrating substantial circumferential wall thickening  and edema, likely with confluent perienteric edema (see axial 33/2 and coronal 38/5. No small bowel dilatation. The terminal ileum is normal. The appendix is normal. Prominent colonic stool volume. Vascular/Lymphatic: No abdominal aortic aneurysm. No evidence for abdominal lymphadenopathy on this noncontrast study. No pelvic sidewall lymphadenopathy. Reproductive: The prostate gland and seminal vesicles are unremarkable. Other: Small volume free fluid noted in the pelvis. Diffuse body wall edema evident. Musculoskeletal: No worrisome lytic or sclerotic osseous abnormality. IMPRESSION: 1. Limited study due to lack of oral and intravenous contrast. Abnormal proximal small bowel loops, likely distal duodenum and proximal jejunum in the medial left upper quadrant demonstrates substantial circumferential wall thickening with  perienteric edema. Imaging features compatible with infectious/inflammatory enteritis. Other etiologies such as ulcerative disease or ischemia are considered less likely, but not excluded. 2. Small volume free fluid in the pelvis with diffuse body wall edema. 3. Cholelithiasis. 4. Small right pleural effusion. 5. Atrophic kidneys. 6. Prominent colonic stool volume. Imaging features could be compatible with clinical constipation. Electronically Signed   By: Misty Stanley M.D.   On: 07/19/2020 05:17   NM Hepatobiliary Liver Func  Result Date: 07/19/2020 CLINICAL DATA:  Cholelithiasis EXAM: NUCLEAR MEDICINE HEPATOBILIARY IMAGING TECHNIQUE: Sequential images of the abdomen were obtained out to 60 minutes following intravenous administration of radiopharmaceutical. RADIOPHARMACEUTICALS:  5.349 mCi Tc-2m  Choletec IV COMPARISON:  CT 07/19/2020, ultrasound 07/19/2020 FINDINGS: Prompt uptake and biliary excretion of activity by the liver is seen. Gallbladder activity is visualized, consistent with patency of cystic duct. Biliary activity passes into small bowel, consistent with patent common bile duct. Activity remains within the gallbladder at the 60 minutes of imaging. A Scholl order requisition included in ejection fraction measurement however patient electively terminated the exam prematurely secondary to abdominal pain. IMPRESSION: Normal uptake and excretion with patency of the cystic and common bile duct as above. Patient electively terminated the exam prior to the second hour of imaging for ejection fraction measurement. Electronically Signed   By: Lovena Le M.D.   On: 07/19/2020 15:09   US Abdomen Limited RUQ  Result Date: 07/19/2020 CLINICAL DATA:  Right upper quadrant abdominal pain EXAM: ULTRASOUND ABDOMEN LIMITED RIGHT UPPER QUADRANT COMPARISON:  None. FINDINGS: Gallbladder: The gallbladder contains numerous small, layering mobile gallstones and sludge. The gallbladder, however, is not distended,  there is no gallbladder wall thickening, and no pericholecystic fluid is identified. The sonographic Percell Miller sign is reportedly negative. Common bile duct: Diameter: 4 mm in proximal diameter Liver: No focal lesion identified. Within normal limits in parenchymal echogenicity. Portal vein is patent on color Doppler imaging with normal direction of blood flow towards the liver. Other: Trace right pleural effusion and perihepatic free fluid is noted. The right kidney is markedly echogenic in keeping with changes of underlying medical renal disease. This is not fully assessed on this examination. IMPRESSION: Cholelithiasis without sonographic evidence of acute cholecystitis. Trace right pleural effusion and perihepatic ascites Diffusely increased renal cortical echogenicity in keeping with underlying medical renal disease. Electronically Signed   By: Fidela Salisbury MD   On: 07/19/2020 01:05    Scheduled Meds: . sodium chloride   Intravenous Once  . amLODipine  10 mg Oral Daily  . calcium-vitamin D  1 tablet Oral TID  . carvedilol  25 mg Oral BID WC  . Chlorhexidine Gluconate Cloth  6 each Topical Daily  . Darunavir-Cobicisctat-Emtricitabine-Tenofovir Alafenamide  1 tablet Oral Q supper  . epoetin (EPOGEN/PROCRIT) injection  10,000 Units Intravenous Q M,W,F-HD  . ferrous  sulfate  18 mg of iron Oral Daily  . hydrALAZINE  100 mg Oral Q8H  . multivitamin  1 tablet Oral QHS    Assessment/Plan:  1. Enteritis seen on CT scan.  Patient also has low grade temperature and right upper quadrant pain.  General surgery does not think that this is the gallbladder.  Will wait for infectious disease evaluation today to see if antibiotics needed. 2. Hypertensive urgency.  Case discussed with nephrology and they added clonidine.  Patient already on amlodipine, Coreg and hydralazine. 3. End-stage renal disease with hyperkalemia 5.2.  On hemodialysis Monday Wednesday and Friday.  Saw him while he was on dialysis.   Dialysis will manage hyperkalemia. 4. Anemia of chronic disease.  Transfuse 1 unit of packed red blood cells yesterday. 5. Acute thrombocytopenia.  Pathology smear still pending.  Platelet count higher today at 98,000. 6. AIDS.  Patient on [place].    Code Status:     Code Status Orders  (From admission, onward)         Start     Ordered   07/19/20 0833  Full code  Continuous        07/19/20 0833        Code Status History    Date Active Date Inactive Code Status Order ID Comments User Context   06/16/2020 1325 07/07/2020 0130 Full Code [id number]  Ivor Costa, MD ED   Advance Care Planning Activity     Disposition Plan: Status is: Inpatient   Dispo: The patient is from: Home              Anticipated d/c is to: Home              Anticipated d/c date is: Likely will need a few days.              Patient currently being treated for hypertensive urgency and enteritis  Consultants:  Infectious disease  Nephrology  Time spent: 28 minutes  [place]

## 2020-07-20 NOTE — Progress Notes (Signed)
   07/20/20 0401  Assess: MEWS Score  Temp 99.8 F (37.7 C)  BP (!) 167/131  Pulse Rate (!) 114  Resp 16  SpO2 99 %  O2 Device Room Air  Assess: MEWS Score  MEWS Temp 0  MEWS Systolic 0  MEWS Pulse 2  MEWS RR 0  MEWS LOC 0  MEWS Score 2  MEWS Score Color Yellow  Assess: if the MEWS score is Yellow or Red  Were vital signs taken at a resting state? Yes  Focused Assessment No change from prior assessment  Early Detection of Sepsis Score *See Row Information* Low  MEWS guidelines implemented *See Row Information* Yes  Treat  Pain Score 7  Pain Type Acute pain  Pain Location Abdomen  Pain Orientation Right;Lower;Upper (right back)  Multiple Pain Sites Yes  2nd Pain Site  Pain Score 8  Pain Location Head  Pain Type Acute pain  Notify: Charge Nurse/RN  Name of Charge Nurse/RN Notified Marcella, RN  Date Charge Nurse/RN Notified 07/20/20  Time Charge Nurse/RN Notified [id number]  Notify: Provider  Provider Name/Title Mosetta Putt, MD  Date Provider Notified 07/20/20  Time Provider Notified [phone]  Notification Type Page (secure chat)  Notification Reason Other (Comment) (elevated BP and heart rate)

## 2020-07-20 NOTE — Progress Notes (Signed)
Provider has been contacted multiple times this shift. MD is aware that patient's BP continues to be elevated. Patient is refusing scheduled hydralazine at 6 am because he has dialysis today. Sepsis bundle was implemented but patient is refusing tele monitor.

## 2020-07-20 NOTE — Progress Notes (Signed)
[facility], [place] 07/20/20  Subjective:   LOS: 0  Patient  Came to the ED with c/o RUQ abdominal pain which started 4 days ago after eating food from [employer].CT scan shows Cholelithiasis. He has h/o ESRD on dialysis MWF.  Patient seen in dialysis today, tolerating treatment well.His BP readings stays high, as per nursing report he refused PO antihypertensives last night. Clonidine given in dialysis after discussing with the patient.      Objective:  Vital signs in last 24 hours:  Temp:  [97.9 F (36.6 C)-99.8 F (37.7 C)] 99 F (37.2 C) (10/15 0949) Pulse Rate:  [84-120] 118 (10/15 0949) Resp:  [15-19] 19 (10/15 0949) BP: (147-181)/(118-135) 172/129 (10/15 0949) SpO2:  [93 %-100 %] 95 % (10/15 0834)  Weight change:  Filed Weights   07/18/20 2215  Weight: 56.7 kg    Intake/Output:    Intake/Output Summary (Last 24 hours) at 07/20/2020 1143 Last data filed at 07/19/2020 1305 Gross per 24 hour  Intake 660 ml  Output --  Net 660 ml    Physical Exam: General:  Resting in bed,receiving dialysis treatment  HEENT  Normocephalic,atraumatic, oral mucous membranes moist  Pulm/lungs   Lungs clear bilaterally, respirations even and unlabored  CVS/Heart  S1S2, tachycardic, regular  Abdomen:   Soft, Tenderness + on RUQ  Extremities:  No peripheral edema  Neurologic:  oriented x3  Skin:  No acute rashes or lesions  Right IJ PermCath     Basic Metabolic Panel:  Recent Labs  Lab 07/18/20 2216 07/20/20 0544  NA 137 133*  K 4.3 5.2*  CL 98 98  CO2 28 23  GLUCOSE 84 93  BUN 26* 50*  CREATININE 5.00* 8.06*  CALCIUM 7.5* 8.3*     CBC: Recent Labs  Lab 07/18/20 2216 07/19/20 1851 07/20/20 0544  WBC 6.7 7.6 8.6  NEUTROABS  --  4.3 5.3  HGB 6.9* 8.3* 8.7*  HCT 22.0* 25.9* 27.4*  MCV 89.1 88.7 88.4  PLT 83* 90* 98*      Lab Results  Component Value Date   HEPBSAG NON REACTIVE 06/17/2020   HEPBIGM NON REACTIVE  06/17/2020      Microbiology:  Recent Results (from the past 240 hour(s))  Respiratory Panel by RT PCR (Flu A&B, Covid) - Nasopharyngeal Swab     Status: None   Collection Time: 07/19/20  6:20 AM   Specimen: Nasopharyngeal Swab  Result Value Ref Range Status   SARS Coronavirus 2 by RT PCR NEGATIVE NEGATIVE Final    Comment: (NOTE) SARS-CoV-2 target nucleic acids are NOT DETECTED.  The SARS-CoV-2 RNA is generally detectable in upper respiratoy specimens during the acute phase of infection. The lowest concentration of SARS-CoV-2 viral copies this assay can detect is 131 copies/mL. A negative result does not preclude SARS-Cov-2 infection and should not be used as the sole basis for treatment or other patient management decisions. A negative result may occur with  improper specimen collection/handling, submission of specimen other than nasopharyngeal swab, presence of viral mutation(s) within the areas targeted by this assay, and inadequate number of viral copies (<131 copies/mL). A negative result must be combined with clinical observations, patient history, and epidemiological information. The expected result is Negative.  Fact Sheet for Patients:  PinkCheek.be  Fact Sheet for Healthcare Providers:  GravelBags.it  This test is no t yet approved or cleared by the [place] FDA and  has been authorized for detection and/or diagnosis of SARS-CoV-2 by FDA under  an Emergency Use Authorization (EUA). This EUA will remain  in effect (meaning this test can be used) for the duration of the COVID-19 declaration under Section 564(b)(1) of the Act, 21 U.S.C. section 360bbb-3(b)(1), unless the authorization is terminated or revoked sooner.     Influenza A by PCR NEGATIVE NEGATIVE Final   Influenza B by PCR NEGATIVE NEGATIVE Final    Comment: (NOTE) The Xpert Xpress SARS-CoV-2/FLU/RSV assay is intended as an aid in  the  diagnosis of influenza from Nasopharyngeal swab specimens and  should not be used as a sole basis for treatment. Nasal washings and  aspirates are unacceptable for Xpert Xpress SARS-CoV-2/FLU/RSV  testing.  Fact Sheet for Patients: PinkCheek.be  Fact Sheet for Healthcare Providers: GravelBags.it  This test is not yet approved or cleared by the [place] FDA and  has been authorized for detection and/or diagnosis of SARS-CoV-2 by  FDA under an Emergency Use Authorization (EUA). This EUA will remain  in effect (meaning this test can be used) for the duration of the  Covid-19 declaration under Section 564(b)(1) of the Act, 21  U.S.C. section 360bbb-3(b)(1), unless the authorization is  terminated or revoked. Performed at [facility], [address] [place], [place] [address]     Coagulation Studies: Recent Labs    07/20/20 0544  LABPROT 12.9  INR 1.0    Urinalysis: Recent Labs    07/18/20 1212  COLORURINE STRAW*  LABSPEC 1.006  PHURINE 9.0*  GLUCOSEU 50*  HGBUR NEGATIVE  BILIRUBINUR NEGATIVE  KETONESUR NEGATIVE  PROTEINUR 100*  NITRITE NEGATIVE  LEUKOCYTESUR NEGATIVE      Imaging: CT ABDOMEN PELVIS WO CONTRAST  Result Date: 07/19/2020 CLINICAL DATA:  Right-sided abdominal pain for 4 days. EXAM: CT ABDOMEN AND PELVIS WITHOUT CONTRAST TECHNIQUE: Multidetector CT imaging of the abdomen and pelvis was performed following the standard protocol without IV contrast. COMPARISON:  None. FINDINGS: Lower chest: Small right pleural effusion. Heart is enlarged. Atelectasis noted at the right base. Catheter tip projects in the right atrium. Hepatobiliary: No focal abnormality in the liver on this study without intravenous contrast. Layering tiny calcified gallstones evident. No intrahepatic or extrahepatic biliary dilation. Pancreas: No focal mass lesion. No dilatation of the main duct. No intraparenchymal  cyst. No peripancreatic edema. Spleen: No splenomegaly. No focal mass lesion. Adrenals/Urinary Tract: No adrenal nodule or mass. Kidneys appear atrophic. No evidence for hydroureter The urinary bladder appears normal for the degree of distention. Stomach/Bowel: Stomach is unremarkable. No gastric wall thickening. No evidence of outlet obstruction. Study is limited by lack of oral and intravenous contrast, but there appears to be a proximal small bowel loop, potentially distal duodenum and proximal jejunum in the medial left upper quadrant demonstrating substantial circumferential wall thickening and edema, likely with confluent perienteric edema (see axial 33/2 and coronal 38/5. No small bowel dilatation. The terminal ileum is normal. The appendix is normal. Prominent colonic stool volume. Vascular/Lymphatic: No abdominal aortic aneurysm. No evidence for abdominal lymphadenopathy on this noncontrast study. No pelvic sidewall lymphadenopathy. Reproductive: The prostate gland and seminal vesicles are unremarkable. Other: Small volume free fluid noted in the pelvis. Diffuse body wall edema evident. Musculoskeletal: No worrisome lytic or sclerotic osseous abnormality. IMPRESSION: 1. Limited study due to lack of oral and intravenous contrast. Abnormal proximal small bowel loops, likely distal duodenum and proximal jejunum in the medial left upper quadrant demonstrates substantial circumferential wall thickening with perienteric edema. Imaging features compatible with infectious/inflammatory enteritis. Other etiologies such as ulcerative disease or  ischemia are considered less likely, but not excluded. 2. Small volume free fluid in the pelvis with diffuse body wall edema. 3. Cholelithiasis. 4. Small right pleural effusion. 5. Atrophic kidneys. 6. Prominent colonic stool volume. Imaging features could be compatible with clinical constipation. Electronically Signed   By: Misty Stanley M.D.   On: 07/19/2020 05:17   NM  Hepatobiliary Liver Func  Result Date: 07/19/2020 CLINICAL DATA:  Cholelithiasis EXAM: NUCLEAR MEDICINE HEPATOBILIARY IMAGING TECHNIQUE: Sequential images of the abdomen were obtained out to 60 minutes following intravenous administration of radiopharmaceutical. RADIOPHARMACEUTICALS:  5.349 mCi Tc-48m  Choletec IV COMPARISON:  CT 07/19/2020, ultrasound 07/19/2020 FINDINGS: Prompt uptake and biliary excretion of activity by the liver is seen. Gallbladder activity is visualized, consistent with patency of cystic duct. Biliary activity passes into small bowel, consistent with patent common bile duct. Activity remains within the gallbladder at the 60 minutes of imaging. A Scholl order requisition included in ejection fraction measurement however patient electively terminated the exam prematurely secondary to abdominal pain. IMPRESSION: Normal uptake and excretion with patency of the cystic and common bile duct as above. Patient electively terminated the exam prior to the second hour of imaging for ejection fraction measurement. Electronically Signed   By: Lovena Le M.D.   On: 07/19/2020 15:09   US Abdomen Limited RUQ  Result Date: 07/19/2020 CLINICAL DATA:  Right upper quadrant abdominal pain EXAM: ULTRASOUND ABDOMEN LIMITED RIGHT UPPER QUADRANT COMPARISON:  None. FINDINGS: Gallbladder: The gallbladder contains numerous small, layering mobile gallstones and sludge. The gallbladder, however, is not distended, there is no gallbladder wall thickening, and no pericholecystic fluid is identified. The sonographic Percell Miller sign is reportedly negative. Common bile duct: Diameter: 4 mm in proximal diameter Liver: No focal lesion identified. Within normal limits in parenchymal echogenicity. Portal vein is patent on color Doppler imaging with normal direction of blood flow towards the liver. Other: Trace right pleural effusion and perihepatic free fluid is noted. The right kidney is markedly echogenic in keeping with  changes of underlying medical renal disease. This is not fully assessed on this examination. IMPRESSION: Cholelithiasis without sonographic evidence of acute cholecystitis. Trace right pleural effusion and perihepatic ascites Diffusely increased renal cortical echogenicity in keeping with underlying medical renal disease. Electronically Signed   By: Fidela Salisbury MD   On: 07/19/2020 01:05     Medications:    . sodium chloride   Intravenous Once  . amLODipine  10 mg Oral Daily  . calcium-vitamin D  1 tablet Oral TID  . carvedilol  25 mg Oral BID WC  . Chlorhexidine Gluconate Cloth  6 each Topical Daily  . Darunavir-Cobicisctat-Emtricitabine-Tenofovir Alafenamide  1 tablet Oral Q supper  . epoetin (EPOGEN/PROCRIT) injection  10,000 Units Intravenous Q M,W,F-HD  . ferrous sulfate  18 mg of iron Oral Daily  . hydrALAZINE  100 mg Oral Q8H  . multivitamin  1 tablet Oral QHS   HYDROmorphone (DILAUDID) injection, labetalol, ondansetron **OR** ondansetron (ZOFRAN) IV  Assessment/ Plan:  29 y.o. male with longstanding hypertension, chronic kidney disease, anemia of chronic kidney disease, secondary hyperparathyroidism,HIV  was admitted on 07/19/2020 for RUQ Abdominal pain.  Principal Problem:   Enteritis Active Problems:   ESRD (end stage renal disease) ([place])   Anemia in ESRD (end-stage renal disease) (HCC)   HIV (human immunodeficiency virus infection) ([place])   Cholelithiasis   Thrombocytopenia (HCC)   Accelerated hypertension  Enteritis [K52.9] Thrombocytopenia (HCC) [D69.6] RUQ pain [R10.11] Acute on chronic anemia [D64.9] Accelerated hypertension [I10]  #.  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 Anemia of CKD  Lab Results  Component Value Date   HGB 8.7 (L) 07/20/2020  -On Epogen with dialysis Continue oral Iron supplements   #. Secondary hyperparathyroidism of renal origin N 25.81      Component Value Date/Time   PTH 654 (H) 06/17/2020 2250   Lab Results  Component Value Date   PHOS 6.2 (H) 06/27/2020   -Will restart home Phosphorus binders when acute abdominal issues resolve    #.  RUQ Abdominal Pain -Patient reports as pain better today -No nausea or vomiting CT Scan on 07/19/20 with concerns of cholelithiasis  Hepatobiliary Scan on 07/19/20 IMPRESSION: Normal uptake and excretion with patency of the cystic and common bile duct as above. Patient electively terminated the exam prior to the second hour of imaging for ejection fraction measurement.    LOS: 0 Crosby Oyster 10/15/202111:43 AM  [facility] [address], [place]

## 2020-07-21 DIAGNOSIS — N186 End stage renal disease: Secondary | ICD-10-CM | POA: Diagnosis not present

## 2020-07-21 DIAGNOSIS — K529 Noninfective gastroenteritis and colitis, unspecified: Secondary | ICD-10-CM | POA: Diagnosis not present

## 2020-07-21 DIAGNOSIS — B20 Human immunodeficiency virus [HIV] disease: Secondary | ICD-10-CM | POA: Diagnosis not present

## 2020-07-21 DIAGNOSIS — I160 Hypertensive urgency: Secondary | ICD-10-CM | POA: Diagnosis not present

## 2020-07-21 LAB — HEPATITIS B SURFACE ANTIBODY, QUANTITATIVE: Hep B S AB Quant (Post): 274 m[IU]/mL (ref >9.9)

## 2020-07-21 MED ORDER — PHENOL 1.4 % MT LIQD
1.0000 | OROMUCOSAL | Status: DC | PRN
  Administered 2020-07-21: 1 via OROMUCOSAL
  Filled 2020-07-21: qty 177

## 2020-07-21 MED ORDER — HYDRALAZINE HCL 100 MG PO TABS: 100.0000 mg | ORAL_TABLET | Freq: Three times a day (TID) | ORAL | 0 refills | Status: AC

## 2020-07-21 MED ORDER — FERROUS SULFATE 75 (15 FE) MG/ML PO SOLN: 18.0000 mg | Freq: Every day | ORAL | 0 refills | Status: AC

## 2020-07-21 MED ORDER — CIPROFLOXACIN HCL 500 MG PO TABS: 500.0000 mg | ORAL_TABLET | ORAL | 0 refills | Status: AC

## 2020-07-21 MED ORDER — METRONIDAZOLE 500 MG PO TABS: 500.0000 mg | ORAL_TABLET | Freq: Three times a day (TID) | ORAL | 0 refills | Status: AC

## 2020-07-21 NOTE — Progress Notes (Signed)
Discharge instructions and med details reviewed with patient. Pt verbalizes understanding. PO antibiotics given to patient (total 2 pill bottles). AVS given to patient. IV removed.   Awaiting on Taxi for transportation.

## 2020-07-21 NOTE — Discharge Summary (Signed)
[place] [facility] [place] NAME: Daniel Combs    MR#:  [id number]  DATE OF BIRTH:  09/17/1991  DATE OF ADMISSION:  07/19/2020 ADMITTING PHYSICIAN: Loletha Grayer, MD  DATE OF DISCHARGE: 07/21/2020 12:55 PM  PRIMARY CARE PHYSICIAN: Pcp, No    ADMISSION DIAGNOSIS:  Enteritis [K52.9] Thrombocytopenia (HCC) [D69.6] RUQ pain [R10.11] Acute on chronic anemia [D64.9] Accelerated hypertension [I10]  DISCHARGE DIAGNOSIS:  Principal Problem:   Enteritis Active Problems:   ESRD (end stage renal disease) ([place])   Anemia in ESRD (end-stage renal disease) ([place])   AIDS (HCC)   Cholelithiasis   Thrombocytopenia (HCC)   Accelerated hypertension   RUQ pain   Hypertensive urgency   SECONDARY DIAGNOSIS:   Past Medical History:  Diagnosis Date  . Hypertension     HOSPITAL COURSE:   1.  Enteritis seen on CT scan.  Patient had low-grade temperature when he came in and severe right upper quadrant pain.  General surgery saw the patient did not believe that this was his gallbladder causing the issue.  Case discussed with infectious disease yesterday and we started Cipro and Flagyl for 3 days.  Patient much improved on 07/21/2020 without any pain.  He was tolerating diet.  Outpatient referral to gastroenterology. 2.  Hypertensive urgency.  The patient's blood pressure is much better today.  The patient states he does not want clonidine.  He states he will take his usual medications amlodipine Coreg and hydralazine.  His hydralazine dose was increased to 100 mg 3 times daily.  The hydralazine was E prescribed into the medication management and he will take 2 3 times a day of what he has at home. 3.  End-stage renal disease with hyperkalemia.  He had hemodialysis yesterday.  Continue hemodialysis Monday Wednesday Friday as outpatient. 4.  Anemia of chronic disease.  The patient was transfused 1 unit of packed red blood cells on admission.  Hemoglobin up to  8.7. 5.  Thrombocytopenia.  Pathology smear not resulted yet.  Platelet count 98,000 upon discharge home. 6.  AIDS on Symtuza  DISCHARGE CONDITIONS:   Satisfactory  CONSULTS OBTAINED:  Treatment Team:  Benjamine Sprague, DO  DRUG ALLERGIES:  No Known Allergies  DISCHARGE MEDICATIONS:   Allergies as of 07/21/2020   No Known Allergies     Medication List    STOP taking these medications   Iron 18 MG Tbcr   sulfamethoxazole-trimethoprim 400-80 MG tablet Commonly known as: BACTRIM     TAKE these medications   amLODipine 10 MG tablet Commonly known as: NORVASC Take 1 tablet (10 mg total) by mouth daily.   carvedilol 25 MG tablet Commonly known as: COREG Take 1 tablet (25 mg total) by mouth 2 (two) times daily with a meal.   ciprofloxacin 500 MG tablet Commonly known as: CIPRO Take 1 tablet (500 mg total) by mouth daily.   Darunavir-Cobicisctat-Emtricitabine-Tenofovir Alafenamide 800-150-200-10 MG Tabs Commonly known as: SYMTUZA Take 1 tablet by mouth daily with supper.   epoetin alfa 10000 UNIT/ML injection Commonly known as: EPOGEN Inject 1 mL (10,000 Units total) into the vein every Monday, Wednesday, and Friday with hemodialysis.   ferrous sulfate 75 (15 Fe) MG/ML Soln Commonly known as: FER-IN-SOL Take 1.2 mLs (18 mg of iron total) by mouth daily.   hydrALAZINE 100 MG tablet Commonly known as: APRESOLINE Take 1 tablet (100 mg total) by mouth every 8 (eight) hours. What changed:   medication strength  how much to take  metroNIDAZOLE 500 MG tablet Commonly known as: FLAGYL Take 1 tablet (500 mg total) by mouth every 8 (eight) hours.   multivitamin Tabs tablet Take 1 tablet by mouth at bedtime.   Oscal 500/200 D-3 500-200 MG-UNIT tablet Generic drug: calcium-vitamin D Take 1 tablet by mouth 3 (three) times daily.        DISCHARGE INSTRUCTIONS:   Follow-up with dialysis on Monday Wednesday Friday Referral to gastroenterology as  outpatient Follow-up Dr. Steva Ready infectious disease 1 week  If you experience worsening of your admission symptoms, develop shortness of breath, life threatening emergency, suicidal or homicidal thoughts you must seek medical attention immediately by calling 911 or calling your MD immediately  if symptoms less severe.  You Must read complete instructions/literature along with all the possible adverse reactions/side effects for all the Medicines you take and that have been prescribed to you. Take any new Medicines after you have completely understood and accept all the possible adverse reactions/side effects.   Please note  You were cared for by a hospitalist during your hospital stay. If you have any questions about your discharge medications or the care you received while you were in the hospital after you are discharged, you can call the unit and asked to speak with the hospitalist on call if the hospitalist that took care of you is not available. Once you are discharged, your primary care physician will handle any further medical issues. Please note that NO REFILLS for any discharge medications will be authorized once you are discharged, as it is imperative that you return to your primary care physician (or establish a relationship with a primary care physician if you do not have one) for your aftercare needs so that they can reassess your need for medications and monitor your lab values.    Today   CHIEF COMPLAINT:   Chief Complaint  Patient presents with  . Abdominal Pain    HISTORY OF PRESENT ILLNESS:  Daniel Combs  is a 29 y.o. male came in with abdominal pain   VITAL SIGNS:  Blood pressure 125/82, pulse (!) 102, temperature 98.8 F (37.1 C), temperature source Oral, resp. rate 18, height 5\' 6"  (1.676 m), weight 56.7 kg, SpO2 100 %.   PHYSICAL EXAMINATION:  GENERAL:  29 y.o.-year-old patient lying in the bed with no acute distress.  EYES: Pupils equal, round, reactive  to light and accommodation. No scleral icterus. Extraocular muscles intact.  HEENT: Head atraumatic, normocephalic. Oropharynx and nasopharynx clear.  LUNGS: Normal breath sounds bilaterally, no wheezing, rales,rhonchi or crepitation. No use of accessory muscles of respiration.  CARDIOVASCULAR: S1, S2 normal. No murmurs, rubs, or gallops.  ABDOMEN: Soft, non-tender, non-distended.  EXTREMITIES: No pedal edema.  NEUROLOGIC: Cranial nerves II through XII are intact. Muscle strength 5/5 in all extremities. Sensation intact. Gait not checked.  PSYCHIATRIC: The patient is alert and oriented x 3.  SKIN: No obvious rash, lesion, or ulcer.   DATA REVIEW:   CBC Recent Labs  Lab 07/20/20 0544  WBC 8.6  HGB 8.7*  HCT 27.4*  PLT 98*    Chemistries  Recent Labs  Lab 07/20/20 0544  NA 133*  K 5.2*  CL 98  CO2 23  GLUCOSE 93  BUN 50*  CREATININE 8.06*  CALCIUM 8.3*  AST 52*  ALT 58*  ALKPHOS 78  BILITOT 0.7    Microbiology Results  Results for orders placed or performed during the hospital encounter of 07/19/20  Respiratory Panel by RT PCR (Flu A&B, Covid) -  Nasopharyngeal Swab     Status: None   Collection Time: 07/19/20  6:20 AM   Specimen: Nasopharyngeal Swab  Result Value Ref Range Status   SARS Coronavirus 2 by RT PCR NEGATIVE NEGATIVE Final    Comment: (NOTE) SARS-CoV-2 target nucleic acids are NOT DETECTED.  The SARS-CoV-2 RNA is generally detectable in upper respiratoy specimens during the acute phase of infection. The lowest concentration of SARS-CoV-2 viral copies this assay can detect is 131 copies/mL. A negative result does not preclude SARS-Cov-2 infection and should not be used as the sole basis for treatment or other patient management decisions. A negative result may occur with  improper specimen collection/handling, submission of specimen other than nasopharyngeal swab, presence of viral mutation(s) within the areas targeted by this assay, and inadequate  number of viral copies (<131 copies/mL). A negative result must be combined with clinical observations, patient history, and epidemiological information. The expected result is Negative.  Fact Sheet for Patients:  PinkCheek.be  Fact Sheet for Healthcare Providers:  GravelBags.it  This test is no t yet approved or cleared by the [place] FDA and  has been authorized for detection and/or diagnosis of SARS-CoV-2 by FDA under an Emergency Use Authorization (EUA). This EUA will remain  in effect (meaning this test can be used) for the duration of the COVID-19 declaration under Section 564(b)(1) of the Act, 21 U.S.C. section 360bbb-3(b)(1), unless the authorization is terminated or revoked sooner.     Influenza A by PCR NEGATIVE NEGATIVE Final   Influenza B by PCR NEGATIVE NEGATIVE Final    Comment: (NOTE) The Xpert Xpress SARS-CoV-2/FLU/RSV assay is intended as an aid in  the diagnosis of influenza from Nasopharyngeal swab specimens and  should not be used as a sole basis for treatment. Nasal washings and  aspirates are unacceptable for Xpert Xpress SARS-CoV-2/FLU/RSV  testing.  Fact Sheet for Patients: PinkCheek.be  Fact Sheet for Healthcare Providers: GravelBags.it  This test is not yet approved or cleared by the [place] FDA and  has been authorized for detection and/or diagnosis of SARS-CoV-2 by  FDA under an Emergency Use Authorization (EUA). This EUA will remain  in effect (meaning this test can be used) for the duration of the  Covid-19 declaration under Section 564(b)(1) of the Act, 21  U.S.C. section 360bbb-3(b)(1), unless the authorization is  terminated or revoked. Performed at [facility], [address] [place], [place] [address]   Culture, blood (x 2)     Status: None (Preliminary result)   Collection Time: 07/20/20  5:43 AM    Specimen: BLOOD  Result Value Ref Range Status   Specimen Description BLOOD LEFT HAND  Final   Special Requests   Final    BOTTLES DRAWN AEROBIC AND ANAEROBIC Blood Culture adequate volume   Culture   Final    NO GROWTH 1 DAY Performed at [facility], [address], [place], [place] [address]    Report Status PENDING  Incomplete  Culture, blood (x 2)     Status: None (Preliminary result)   Collection Time: 07/20/20  5:44 AM   Specimen: BLOOD  Result Value Ref Range Status   Specimen Description BLOOD RIGHT [facility]  Final   Special Requests   Final    BOTTLES DRAWN AEROBIC AND ANAEROBIC Blood Culture adequate volume   Culture   Final    NO GROWTH 1 DAY Performed at [facility], [address] [place], [place] [address]    Report Status  PENDING  Incomplete     Management plans discussed with the patient, and he is in agreement.  CODE STATUS:     Code Status Orders  (From admission, onward)         Start     Ordered   07/19/20 0833  Full code  Continuous        07/19/20 0833        Code Status History    Date Active Date Inactive Code Status Order ID Comments User Context   06/16/2020 1325 07/07/2020 0130 Full Code [id number]  Ivor Costa, MD ED   Advance Care Planning Activity      TOTAL TIME TAKING CARE OF THIS PATIENT: 35 minutes.    Loletha Grayer M.D on 07/21/2020 at 3:42 PM  Between 7am to 6pm - Pager - [phone]  After 6pm go to [email or web address] - password [email or web address]  [facility]  CC: Primary care physician; Pcp, No

## 2020-07-21 NOTE — Progress Notes (Addendum)
The patient patient continuous to complain of pain in the abdomen. Tolerating oral antibiotics. His blood pressure has been stabilized. RN spoke with night shift provider about the cardiac monitor expiring and wants to keep cardiac until the morning.

## 2020-07-21 NOTE — Discharge Instructions (Signed)
You can take 2 of the hydralazine three times a day (of what you already have).  I did e-scribe a new script for 100mg  hydralazine three times a day into the medication management which will open Monday.  Abdominal Pain, Adult Many things can cause belly (abdominal) pain. Most times, belly pain is not dangerous. Many cases of belly pain can be watched and treated at home. Sometimes, though, belly pain is serious. Your doctor will try to find the cause of your belly pain. Follow these instructions at home:  Medicines  Take over-the-counter and prescription medicines only as told by your doctor.  Do not take medicines that help you poop (laxatives) unless told by your doctor. General instructions  Watch your belly pain for any changes.  Drink enough fluid to keep your pee (urine) pale yellow.  Keep all follow-up visits as told by your doctor. This is important. Contact a doctor if:  Your belly pain changes or gets worse.  You are not hungry, or you lose weight without trying.  You are having trouble pooping (constipated) or have watery poop (diarrhea) for more than 2-3 days.  You have pain when you pee or poop.  Your belly pain wakes you up at night.  Your pain gets worse with meals, after eating, or with certain foods.  You are vomiting and cannot keep anything down.  You have a fever.  You have blood in your pee. Get help right away if:  Your pain does not go away as soon as your doctor says it should.  You cannot stop vomiting.  Your pain is only in areas of your belly, such as the right side or the left lower part of the belly.  You have bloody or black poop, or poop that looks like tar.  You have very bad pain, cramping, or bloating in your belly.  You have signs of not having enough fluid or water in your body (dehydration), such as: ? Dark pee, very little pee, or no pee. ? Cracked lips. ? Dry mouth. ? Sunken eyes. ? Sleepiness. ? Weakness.  You have  trouble breathing or chest pain. Summary  Many cases of belly pain can be watched and treated at home.  Watch your belly pain for any changes.  Take over-the-counter and prescription medicines only as told by your doctor.  Contact a doctor if your belly pain changes or gets worse.  Get help right away if you have very bad pain, cramping, or bloating in your belly. This information is not intended to replace advice given to you by your health care provider. Make sure you discuss any questions you have with your health care provider. Document Revised: 01/31/2019 Document Reviewed: 01/31/2019 Elsevier Patient Education  [place].

## 2020-07-21 NOTE — Progress Notes (Signed)
[facility], [place] 07/21/20  Subjective:   LOS: 1  Patient  Came to the ED with c/o RUQ abdominal pain which started 4 days ago after eating food from [employer].CT scan shows Cholelithiasis. He has h/o ESRD on dialysis MWF. Patient received hemodialysis treatment yesterday, tolerated well.  He reports feeling better today, no nausea or vomiting.He still has some abdominal pain on his right side.  He is getting ready to get discharged, will continue dialysis as outpatient.      Objective:  Vital signs in last 24 hours:  Temp:  [98.6 F (37 C)-99.8 F (37.7 C)] 98.8 F (37.1 C) (10/16 0905) Pulse Rate:  [95-111] 102 (10/16 0905) Resp:  [16-18] 18 (10/16 0905) BP: (122-158)/(82-115) 125/82 (10/16 0905) SpO2:  [98 %-100 %] 100 % (10/16 0905)  Weight change:  Filed Weights   07/18/20 2215  Weight: 56.7 kg    Intake/Output:    Intake/Output Summary (Last 24 hours) at 07/21/2020 1220 Last data filed at 07/20/2020 1300 Gross per 24 hour  Intake --  Output 1500 ml  Net -1500 ml    Physical Exam: General:  Pleasant, in no acute distress  HEENT  Normocephalic,atraumatic, oral mucous membranes moist  Pulm/lungs  respirations normal and symmetrical, lungs clear  CVS/Heart  Regular, tachycardic  Abdomen:   Soft, mild tenderness + on RUQ  Extremities:  No peripheral edema  Neurologic:  Awake, alert, oriented  Skin:  No acute rashes or lesions  Right IJ PermCath     Basic Metabolic Panel:  Recent Labs  Lab 07/18/20 2216 07/20/20 0544  NA 137 133*  K 4.3 5.2*  CL 98 98  CO2 28 23  GLUCOSE 84 93  BUN 26* 50*  CREATININE 5.00* 8.06*  CALCIUM 7.5* 8.3*     CBC: Recent Labs  Lab 07/18/20 2216 07/19/20 1851 07/20/20 0544  WBC 6.7 7.6 8.6  NEUTROABS  --  4.3 5.3  HGB 6.9* 8.3* 8.7*  HCT 22.0* 25.9* 27.4*  MCV 89.1 88.7 88.4  PLT 83* 90* 98*      Lab Results  Component Value Date   HEPBSAG NON REACTIVE 07/20/2020    HEPBIGM NON REACTIVE 06/17/2020      Microbiology:  Recent Results (from the past 240 hour(s))  Respiratory Panel by RT PCR (Flu A&B, Covid) - Nasopharyngeal Swab     Status: None   Collection Time: 07/19/20  6:20 AM   Specimen: Nasopharyngeal Swab  Result Value Ref Range Status   SARS Coronavirus 2 by RT PCR NEGATIVE NEGATIVE Final    Comment: (NOTE) SARS-CoV-2 target nucleic acids are NOT DETECTED.  The SARS-CoV-2 RNA is generally detectable in upper respiratoy specimens during the acute phase of infection. The lowest concentration of SARS-CoV-2 viral copies this assay can detect is 131 copies/mL. A negative result does not preclude SARS-Cov-2 infection and should not be used as the sole basis for treatment or other patient management decisions. A negative result may occur with  improper specimen collection/handling, submission of specimen other than nasopharyngeal swab, presence of viral mutation(s) within the areas targeted by this assay, and inadequate number of viral copies (<131 copies/mL). A negative result must be combined with clinical observations, patient history, and epidemiological information. The expected result is Negative.  Fact Sheet for Patients:  PinkCheek.be  Fact Sheet for Healthcare Providers:  GravelBags.it  This test is no t yet approved or cleared by the [place] FDA and  has been authorized for detection and/or  diagnosis of SARS-CoV-2 by FDA under an Emergency Use Authorization (EUA). This EUA will remain  in effect (meaning this test can be used) for the duration of the COVID-19 declaration under Section 564(b)(1) of the Act, 21 U.S.C. section 360bbb-3(b)(1), unless the authorization is terminated or revoked sooner.     Influenza A by PCR NEGATIVE NEGATIVE Final   Influenza B by PCR NEGATIVE NEGATIVE Final    Comment: (NOTE) The Xpert Xpress SARS-CoV-2/FLU/RSV assay is intended as  an aid in  the diagnosis of influenza from Nasopharyngeal swab specimens and  should not be used as a sole basis for treatment. Nasal washings and  aspirates are unacceptable for Xpert Xpress SARS-CoV-2/FLU/RSV  testing.  Fact Sheet for Patients: PinkCheek.be  Fact Sheet for Healthcare Providers: GravelBags.it  This test is not yet approved or cleared by the [place] FDA and  has been authorized for detection and/or diagnosis of SARS-CoV-2 by  FDA under an Emergency Use Authorization (EUA). This EUA will remain  in effect (meaning this test can be used) for the duration of the  Covid-19 declaration under Section 564(b)(1) of the Act, 21  U.S.C. section 360bbb-3(b)(1), unless the authorization is  terminated or revoked. Performed at [facility], [address] [place], [place] [address]   Culture, blood (x 2)     Status: None (Preliminary result)   Collection Time: 07/20/20  5:43 AM   Specimen: BLOOD  Result Value Ref Range Status   Specimen Description BLOOD LEFT HAND  Final   Special Requests   Final    BOTTLES DRAWN AEROBIC AND ANAEROBIC Blood Culture adequate volume   Culture   Final    NO GROWTH 1 DAY Performed at [facility], [address], [place], [place] [address]    Report Status PENDING  Incomplete  Culture, blood (x 2)     Status: None (Preliminary result)   Collection Time: 07/20/20  5:44 AM   Specimen: BLOOD  Result Value Ref Range Status   Specimen Description BLOOD RIGHT [facility]  Final   Special Requests   Final    BOTTLES DRAWN AEROBIC AND ANAEROBIC Blood Culture adequate volume   Culture   Final    NO GROWTH 1 DAY Performed at [facility], [address] [place],  [address]    Report Status PENDING  Incomplete    Coagulation Studies: Recent Labs    07/20/20 0544  LABPROT 12.9  INR 1.0    Urinalysis: No results for input(s): COLORURINE, LABSPEC,  PHURINE, GLUCOSEU, HGBUR, BILIRUBINUR, KETONESUR, PROTEINUR, UROBILINOGEN, NITRITE, LEUKOCYTESUR in the last 72 hours.  Invalid input(s): APPERANCEUR    Imaging: NM Hepatobiliary Liver Func  Result Date: 07/19/2020 CLINICAL DATA:  Cholelithiasis EXAM: NUCLEAR MEDICINE HEPATOBILIARY IMAGING TECHNIQUE: Sequential images of the abdomen were obtained out to 60 minutes following intravenous administration of radiopharmaceutical. RADIOPHARMACEUTICALS:  5.349 mCi Tc-85m  Choletec IV COMPARISON:  CT 07/19/2020, ultrasound 07/19/2020 FINDINGS: Prompt uptake and biliary excretion of activity by the liver is seen. Gallbladder activity is visualized, consistent with patency of cystic duct. Biliary activity passes into small bowel, consistent with patent common bile duct. Activity remains within the gallbladder at the 60 minutes of imaging. A Scholl order requisition included in ejection fraction measurement however patient electively terminated the exam prematurely secondary to abdominal pain. IMPRESSION: Normal uptake and excretion with patency of the cystic and common bile duct as above. Patient electively terminated the exam prior to the second hour of imaging for ejection fraction  measurement. Electronically Signed   By: Lovena Le M.D.   On: 07/19/2020 15:09     Medications:    . sodium chloride   Intravenous Once  . amLODipine  10 mg Oral Daily  . calcium-vitamin D  1 tablet Oral TID  . carvedilol  25 mg Oral BID WC  . Chlorhexidine Gluconate Cloth  6 each Topical Daily  . ciprofloxacin  500 mg Oral Q24H  . Darunavir-Cobicisctat-Emtricitabine-Tenofovir Alafenamide  1 tablet Oral Q supper  . epoetin (EPOGEN/PROCRIT) injection  10,000 Units Intravenous Q M,W,F-HD  . ferrous sulfate  18 mg of iron Oral Daily  . hydrALAZINE  100 mg Oral Q8H  . metroNIDAZOLE  500 mg Oral Q8H  . multivitamin  1 tablet Oral QHS   HYDROmorphone (DILAUDID) injection, labetalol, ondansetron **OR** ondansetron (ZOFRAN)  IV, phenol  Assessment/ Plan:  29 y.o. male with longstanding hypertension, chronic kidney disease, anemia of chronic kidney disease, secondary hyperparathyroidism,HIV  was admitted on 07/19/2020 for RUQ Abdominal pain.  Principal Problem:   Enteritis Active Problems:   ESRD (end stage renal disease) ([place])   Anemia in ESRD (end-stage renal disease) (HCC)   AIDS (HCC)   Cholelithiasis   Thrombocytopenia (HCC)   Accelerated hypertension   RUQ pain   Hypertensive urgency  Enteritis [K52.9] Thrombocytopenia (HCC) [D69.6] RUQ pain [R10.11] Acute on chronic anemia [D64.9] Accelerated hypertension [I10]  #. 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 Anemia of CKD  Lab Results  Component Value Date   HGB 8.7 (L) 07/20/2020  -On Epogen with dialysis MWF Continue oral Iron supplements   #. Secondary hyperparathyroidism of renal origin N 25.81      Component Value Date/Time   PTH 654 (H) 06/17/2020 2250   Lab Results  Component Value Date   PHOS 6.2 (H) 06/27/2020   -Continue phosphorus binders as outpatient    #.  RUQ Abdominal Pain -Abdominal pain better, controlled with pain medications -No nausea or vomiting       LOS: Daniel Combs 10/16/202112:20 PM  [place], [place]

## 2020-07-21 NOTE — TOC Transition Note (Signed)
Transition of Care [facility]) - CM/SW Discharge Note   Patient Details  Name: Daniel Combs MRN: [id number] Date of Birth: December 10, 1990  Transition [facility] Care [facility]) CM/SW Contact:  Harriet Masson, RN Phone [phone] 07/21/2020, 8:58 AM   Clinical Narrative:     RN received notice of pt's discharge from the hospital today and will need assistance with two ongoing medications. TOC RN able to arrange in-house pharmacy to assist with the prescribed Cipro and Flagyl (will be delivered to the floor) verified by Myra in pharmacy. Team is aware.   TOC RN also spoke to pt who will need transportation assistance upon his discharge today. Transportation voucher offered and available at the nurse's station at the time of his discharge. Pt has supportive family/friends with no additional needs.       Patient Goals and CMS Choice        Discharge Placement                       Discharge Plan and Services                                     Social Determinants of Health (SDOH) Interventions     Readmission Risk Interventions No flowsheet data found.

## 2020-07-23 LAB — PARVOVIRUS B19 ANTIBODY, IGG AND IGM
Parovirus B19 IgG Abs: 0.5 index (ref 0.0–0.8)
Parovirus B19 IgM Abs: 0 index (ref 0.0–0.8)

## 2020-07-23 LAB — PATHOLOGIST SMEAR REVIEW

## 2020-07-24 ENCOUNTER — Ambulatory Visit: Payer: Medicaid Other | Admitting: Infectious Diseases

## 2020-07-25 LAB — CULTURE, BLOOD (ROUTINE X 2)
Culture: NO GROWTH
Culture: NO GROWTH
Special Requests: ADEQUATE
Special Requests: ADEQUATE

## 2021-04-29 ENCOUNTER — Emergency Department
Admission: EM | Admit: 2021-04-29 | Discharge: 2021-04-29 | Disposition: A | Discharge status: Home / Self Care | Payer: Medicaid Other | Attending: Emergency Medicine | Admitting: Emergency Medicine

## 2021-04-29 ENCOUNTER — Emergency Department: Payer: Medicaid Other

## 2021-04-29 ENCOUNTER — Other Ambulatory Visit: Payer: Self-pay

## 2021-04-29 ENCOUNTER — Encounter: Payer: Self-pay | Admitting: Emergency Medicine

## 2021-04-29 DIAGNOSIS — N186 End stage renal disease: Secondary | ICD-10-CM | POA: Insufficient documentation

## 2021-04-29 DIAGNOSIS — B20 Human immunodeficiency virus [HIV] disease: Secondary | ICD-10-CM | POA: Diagnosis not present

## 2021-04-29 DIAGNOSIS — F17200 Nicotine dependence, unspecified, uncomplicated: Secondary | ICD-10-CM | POA: Diagnosis not present

## 2021-04-29 DIAGNOSIS — Z79899 Other long term (current) drug therapy: Secondary | ICD-10-CM | POA: Insufficient documentation

## 2021-04-29 DIAGNOSIS — R072 Precordial pain: Secondary | ICD-10-CM | POA: Insufficient documentation

## 2021-04-29 DIAGNOSIS — Z992 Dependence on renal dialysis: Secondary | ICD-10-CM | POA: Insufficient documentation

## 2021-04-29 DIAGNOSIS — I120 Hypertensive chronic kidney disease with stage 5 chronic kidney disease or end stage renal disease: Secondary | ICD-10-CM | POA: Diagnosis not present

## 2021-04-29 HISTORY — DX: Dependence on renal dialysis: Z99.2

## 2021-04-29 LAB — BASIC METABOLIC PANEL
Anion gap: 16 — ABNORMAL HIGH (ref 5–15)
BUN: 74 mg/dL — ABNORMAL HIGH (ref 6–20)
CO2: 21 mmol/L — ABNORMAL LOW (ref 22–32)
Calcium: 8.4 mg/dL — ABNORMAL LOW (ref 8.9–10.3)
Chloride: 101 mmol/L (ref 98–111)
Creatinine, Ser: 13.88 mg/dL — ABNORMAL HIGH (ref 0.61–1.24)
GFR, Estimated: 4 mL/min — ABNORMAL LOW (ref >60)
Glucose, Bld: 106 mg/dL — ABNORMAL HIGH (ref 70–99)
Potassium: 4.2 mmol/L (ref 3.5–5.1)
Sodium: 138 mmol/L (ref 135–145)

## 2021-04-29 LAB — CBC
HCT: 26.7 % — ABNORMAL LOW (ref 39.0–52.0)
Hemoglobin: 8.6 g/dL — ABNORMAL LOW (ref 13.0–17.0)
MCH: 25.5 pg — ABNORMAL LOW (ref 26.0–34.0)
MCHC: 32.2 g/dL (ref 30.0–36.0)
MCV: 79.2 fL — ABNORMAL LOW (ref 80.0–100.0)
Platelets: 211 10*3/uL (ref 150–400)
RBC: 3.37 MIL/uL — ABNORMAL LOW (ref 4.22–5.81)
RDW: 23.6 % — ABNORMAL HIGH (ref 11.5–15.5)
WBC: 3.7 10*3/uL — ABNORMAL LOW (ref 4.0–10.5)
nRBC: 0 % (ref 0.0–0.2)

## 2021-04-29 LAB — TROPONIN I (HIGH SENSITIVITY): Troponin I (High Sensitivity): 169 ng/L (ref <18)

## 2021-04-29 MED ORDER — CHLORHEXIDINE GLUCONATE CLOTH 2 % EX PADS
6.0000 | MEDICATED_PAD | Freq: Every day | CUTANEOUS | Status: DC
  Filled 2021-04-29: qty 6

## 2021-04-29 MED ORDER — DIPHENHYDRAMINE HCL 50 MG/ML IJ SOLN
25.0000 mg | Freq: Once | INTRAMUSCULAR | Status: AC
  Administered 2021-04-29: 25 mg via INTRAVENOUS

## 2021-04-29 MED ORDER — HEPARIN SODIUM (PORCINE) 1000 UNIT/ML IJ SOLN
INTRAMUSCULAR | Status: AC
  Filled 2021-04-29: qty 2

## 2021-04-29 MED ORDER — ALTEPLASE 2 MG IJ SOLR
2.0000 mg | Freq: Once | INTRAMUSCULAR | Status: DC | PRN
  Filled 2021-04-29: qty 2

## 2021-04-29 MED ORDER — SODIUM CHLORIDE 0.9 % IV SOLN: 100.0000 mL | INTRAVENOUS | Status: DC | PRN

## 2021-04-29 MED ORDER — DIPHENHYDRAMINE HCL 50 MG/ML IJ SOLN
INTRAMUSCULAR | Status: AC
  Filled 2021-04-29: qty 1

## 2021-04-29 MED ORDER — PENTAFLUOROPROP-TETRAFLUOROETH EX AERO
1.0000 "application " | INHALATION_SPRAY | CUTANEOUS | Status: DC | PRN
  Filled 2021-04-29: qty 30

## 2021-04-29 MED ORDER — LIDOCAINE HCL (PF) 1 % IJ SOLN: 5.0000 mL | INTRAMUSCULAR | Status: DC | PRN

## 2021-04-29 MED ORDER — LIDOCAINE-PRILOCAINE 2.5-2.5 % EX CREA: 1.0000 "application " | TOPICAL_CREAM | CUTANEOUS | Status: DC | PRN

## 2021-04-29 MED ORDER — HEPARIN SODIUM (PORCINE) 1000 UNIT/ML DIALYSIS
1000.0000 [IU] | INTRAMUSCULAR | Status: DC | PRN
  Administered 2021-04-29: 1000 [IU] via INTRAVENOUS_CENTRAL
  Filled 2021-04-29 (×2): qty 1

## 2021-04-29 NOTE — Discharge Instructions (Addendum)
Social work has arranged for dialysis for you please be sure to make your sessions

## 2021-04-29 NOTE — Progress Notes (Signed)
Pt HD tx ended at [facility]. Pt met goal of 2.5L fluid removal. Alert, oriented and no c/o post HD. Catheter dressing changed and catheter heparin locked. Report to ED RN. Pt to be discharged to home from ED. [facility] notified of pt departure from HD unit.

## 2021-04-29 NOTE — Progress Notes (Signed)
[facility]  ROUNDING NOTE   Subjective:   Daniel Combs is a 30 y.o. male with past medical history of hypertension and end stage renal disease on dialysis. Patient is visiting from [place] and had not set up dialysis treatments prior to leaving. He presents to the ED requesting scheduled dialysis.   Patient is known to this clinic from previous hospitalizations in the past, including the initial start to dialysis. He did receive dialysis Friday at home clinic in [place]. He states he has some shortness of breath and feels like he has fluid on. Denies nausea, vomiting, and diarrhea. Denies pain and discomfort.    Objective:  Vital signs in last 24 hours:  Temp:  [97.8 F (36.6 C)-97.9 F (36.6 C)] 97.9 F (36.6 C) (07/25 1003) Pulse Rate:  [90-103] 101 (07/25 1507) Resp:  [16-20] 18 (07/25 1507) BP: (142-165)/(110-127) 165/127 (07/25 1402) SpO2:  [97 %-100 %] 98 % (07/25 1507) Weight:  [62.6 kg] 62.6 kg (07/25 0537)  Weight change:  Filed Weights   04/29/21 0537  Weight: 62.6 kg    Intake/Output: No intake/output data recorded.   Intake/Output this shift:  No intake/output data recorded.  Physical Exam: General: NAD, resting on stretcher  Head: Normocephalic, atraumatic. Moist oral mucosal membranes  Eyes: Anicteric  Lungs:  Clear to auscultation  Heart: Regular rate and rhythm  Abdomen:  Soft, nontender  Extremities:  no peripheral edema.  Neurologic: Alert and oriented, moving all four extremities  Skin: No lesions  Access: Rt Permcath    Basic Metabolic Panel: Recent Labs  Lab 04/29/21 0541  NA 138  K 4.2  CL 101  CO2 21*  GLUCOSE 106*  BUN 74*  CREATININE 13.88*  CALCIUM 8.4*    Liver Function Tests: No results for input(s): AST, ALT, ALKPHOS, BILITOT, PROT, ALBUMIN in the last 168 hours. No results for input(s): LIPASE, AMYLASE in the last 168 hours. No results for input(s): AMMONIA in the last 168 hours.  CBC: Recent Labs   Lab 04/29/21 0541  WBC 3.7*  HGB 8.6*  HCT 26.7*  MCV 79.2*  PLT 211    Cardiac Enzymes: No results for input(s): CKTOTAL, CKMB, CKMBINDEX, TROPONINI in the last 168 hours.  BNP: Invalid input(s): POCBNP  CBG: No results for input(s): GLUCAP in the last 168 hours.  Microbiology: Results for orders placed or performed during the hospital encounter of 07/19/20  Respiratory Panel by RT PCR (Flu A&B, Covid) - Nasopharyngeal Swab     Status: None   Collection Time: 07/19/20  6:20 AM   Specimen: Nasopharyngeal Swab  Result Value Ref Range Status   SARS Coronavirus 2 by RT PCR NEGATIVE NEGATIVE Final    Comment: (NOTE) SARS-CoV-2 target nucleic acids are NOT DETECTED.  The SARS-CoV-2 RNA is generally detectable in upper respiratoy specimens during the acute phase of infection. The lowest concentration of SARS-CoV-2 viral copies this assay can detect is 131 copies/mL. A negative result does not preclude SARS-Cov-2 infection and should not be used as the sole basis for treatment or other patient management decisions. A negative result may occur with  improper specimen collection/handling, submission of specimen other than nasopharyngeal swab, presence of viral mutation(s) within the areas targeted by this assay, and inadequate number of viral copies (<131 copies/mL). A negative result must be combined with clinical observations, patient history, and epidemiological information. The expected result is Negative.  Fact Sheet for Patients:  PinkCheek.be  Fact Sheet for Healthcare Providers:  GravelBags.it  This test is no t yet approved or cleared by the [place] and  has been authorized for detection and/or diagnosis of SARS-CoV-2 by FDA under an Emergency Use Authorization (EUA). This EUA will remain  in effect (meaning this test can be used) for the duration of the COVID-19 declaration under Section 564(b)(1)  of the Act, 21 U.S.C. section 360bbb-3(b)(1), unless the authorization is terminated or revoked sooner.     Influenza A by PCR NEGATIVE NEGATIVE Final   Influenza B by PCR NEGATIVE NEGATIVE Final    Comment: (NOTE) The Xpert Xpress SARS-CoV-2/FLU/RSV assay is intended as an aid in  the diagnosis of influenza from Nasopharyngeal swab specimens and  should not be used as a sole basis for treatment. Nasal washings and  aspirates are unacceptable for Xpert Xpress SARS-CoV-2/FLU/RSV  testing.  Fact Sheet for Patients: PinkCheek.be  Fact Sheet for Healthcare Providers: GravelBags.it  This test is not yet approved or cleared by the [place] FDA and  has been authorized for detection and/or diagnosis of SARS-CoV-2 by  FDA under an Emergency Use Authorization (EUA). This EUA will remain  in effect (meaning this test can be used) for the duration of the  Covid-19 declaration under Section 564(b)(1) of the Act, 21  U.S.C. section 360bbb-3(b)(1), unless the authorization is  terminated or revoked. Performed at [facility], [address] [place], [place] [address]   Culture, blood (x 2)     Status: None   Collection Time: 07/20/20  5:43 AM   Specimen: BLOOD  Result Value Ref Range Status   Specimen Description BLOOD LEFT HAND  Final   Special Requests   Final    BOTTLES DRAWN AEROBIC AND ANAEROBIC Blood Culture adequate volume   Culture   Final    NO GROWTH 5 DAYS Performed at [facility], [address] [place], [place] [address]    Report Status 07/25/2020 FINAL  Final  Culture, blood (x 2)     Status: None   Collection Time: 07/20/20  5:44 AM   Specimen: BLOOD  Result Value Ref Range Status   Specimen Description BLOOD RIGHT [facility]  Final   Special Requests   Final    BOTTLES DRAWN AEROBIC AND ANAEROBIC Blood Culture adequate volume   Culture   Final    NO GROWTH 5 DAYS Performed at [facility], [address], [place], [place] [address]    Report Status 07/25/2020 FINAL  Final    Coagulation Studies: No results for input(s): LABPROT, INR in the last 72 hours.  Urinalysis: No results for input(s): COLORURINE, LABSPEC, PHURINE, GLUCOSEU, HGBUR, BILIRUBINUR, KETONESUR, PROTEINUR, UROBILINOGEN, NITRITE, LEUKOCYTESUR in the last 72 hours.  Invalid input(s): APPERANCEUR    Imaging: DG Chest 2 View  Result Date: 04/29/2021 CLINICAL DATA:  Chest pain EXAM: CHEST - 2 VIEW COMPARISON:  07/05/2020 FINDINGS: Stable position of right chest wall dialysis catheter. Cardiac enlargement. Small right pleural effusion with mild interstitial edema. No airspace consolidation. No acute osseous abnormalities. IMPRESSION: Mild CHF. Electronically Signed   By: Kerby Moors M.D.   On: 04/29/2021 06:12     Medications:    sodium chloride     sodium chloride      [START ON 04/30/2021] Chlorhexidine Gluconate Cloth  6 each Topical Q0600   diphenhydrAMINE  25 mg Intravenous Once   sodium chloride, sodium chloride, alteplase, heparin, lidocaine (PF), lidocaine-prilocaine, pentafluoroprop-tetrafluoroeth  Assessment/ Plan:  Mr. Precious Derman is a 30 y.o.  male  with past medical history of hypertension and end stage renal disease on dialysis. Patient is visiting from [place] and had not set up dialysis treatments prior to leaving. He presents to the ED requesting scheduled dialysis.   Anemia of chronic kidney disease  Lab Results  Component Value Date   HGB 8.6 (L) 04/29/2021  Hgb is below target Will monitor  2. End stage renal disease on dialysis Will perform dialysis today. Attempted to find an outpatient clinic to provide treatment, but no success. Outpatient treatment arranged for Wed/Friday this week at [facility] UF goal 2L today, as tolerated  3. Secondary Hyperparathyroidism: with outpatient labs   Lab Results  Component Value Date   PTH 654 (H) 06/17/2020    CALCIUM 8.4 (L) 04/29/2021   PHOS 6.2 (H) 06/27/2020   Phosphorus and calcium not at goal Renvela with meals   LOS: 0 Mathieu Schloemer 7/25/20223:24 PM

## 2021-04-29 NOTE — ED Notes (Signed)
Patient to hemodialysis via stretcher and transport. Patient awake and alert.

## 2021-04-29 NOTE — ED Notes (Signed)
This RN discussed with patient that [employer] closed, unable to provide cab voucher due to cab company closed. Apologized for inconvenience. This RN discussed with patient different options regarding getting home. Pt states that he is always provided transport back to his mother's house due to his mother not driver. Pt states unable to call for uber/lyft due to his phone being dead. This RN discussed with patient able to charge phone/courtesy phone in lobby for patient to use to call for ride.

## 2021-04-29 NOTE — ED Triage Notes (Signed)
Pt comes into the ED via [facility] from his mother's house c/o chest tightness and need for dialysis.  Pt is from out of town, did get last dialysis on Friday and tried to set it up with [facility] on Monday but they informed him that they are full.  Pt in NAD at this time.

## 2021-04-29 NOTE — ED Notes (Signed)
Report given to Dialysis.

## 2021-04-29 NOTE — ED Notes (Signed)
Patient awaiting transport home.

## 2021-04-29 NOTE — ED Notes (Signed)
Patient to be discharged. States he was told by dialysis coordinator that transportation would be able to take him home. Charge RN notified and aware.

## 2021-04-29 NOTE — Progress Notes (Signed)
Post HD vitals 

## 2021-04-29 NOTE — Progress Notes (Signed)
Pre HD RN assessment 

## 2021-04-29 NOTE — ED Notes (Addendum)
Says chest pain on and off, but that is normal for h im.  Says increasedd shortness of breath.  Says it feels like thereis fluid in his lungs.  His dialysis is on Monday wed fri.  He was dialyzed on Friday last.  Says he does not live here and had planned on going [facility] [place].  [facility] is [facility] and cannot take him.

## 2021-04-29 NOTE — ED Notes (Signed)
Patient is resting comfortably. 

## 2021-04-30 NOTE — ED Provider Notes (Signed)
Pain  [facility] Emergency Department Provider Note   ____________________________________________   Event Date/Time   First MD Initiated Contact with Patient 04/29/21 1003     (approximate)  I have reviewed the triage vital signs and the nursing notes.   HISTORY  Chief Complaint Chest Pain    HPI Daniel Combs is a 30 y.o. male with below stated past medical history and significant for end-stage renal disease on dialysis M/W/F who presents for chest pain  LOCATION: Substernal DURATION: 3 days TIMING: Worsening since onset SEVERITY: Moderate QUALITY: Crushing, tightness CONTEXT: States that he has missed his last dialysis session before the weekend and has had worsening chest pain and shortness of breath since that time MODIFYING FACTORS: Exertion worsens this pain and is partially relieved at rest ASSOCIATED SYMPTOMS: Shortness of breath with dyspnea on exertion   Per medical record review, patient currently living in [place] and was not able to set up dialysis during his visit to [place]          Past Medical History:  Diagnosis Date   Dialysis patient [facility])    Hypertension     Patient Active Problem List   Diagnosis Date Noted   Accelerated hypertension 07/20/2020   RUQ pain    Hypertensive urgency    Enteritis 07/19/2020   AIDS ([place]) 07/19/2020   Cholelithiasis 07/19/2020   Thrombocytopenia ([place]) 07/19/2020   Pleural effusion on right 06/26/2020   Bacteremia due to Streptococcus pneumoniae 06/21/2020   CAP (community acquired pneumonia) 06/16/2020   ESRD (end stage renal disease) ([place]) 06/16/2020   Sepsis ([place]) 06/16/2020   Hyponatremia 06/16/2020   Hyperkalemia 06/16/2020   Elevated troponin 06/16/2020   Anemia in ESRD (end-stage renal disease) ([place]) 06/16/2020   Hypertension     Past Surgical History:  Procedure Laterality Date   DIALYSIS/PERMA CATHETER INSERTION N/A 06/18/2020   Procedure: DIALYSIS/PERMA  CATHETER INSERTION;  Surgeon: Algernon Huxley, MD;  Location: [facility];  Service: Cardiovascular;  Laterality: N/A;    Prior to Admission medications   Medication Sig Start Date End Date Taking? Authorizing Provider  amLODipine (NORVASC) 10 MG tablet Take 1 tablet (10 mg total) by mouth daily. 07/06/20 10/04/20  Enzo Bi, MD  calcium-vitamin D (OSCAL 500/200 D-3) 500-200 MG-UNIT tablet Take 1 tablet by mouth 3 (three) times daily. 07/06/20 10/04/20  Enzo Bi, MD  carvedilol (COREG) 25 MG tablet Take 1 tablet (25 mg total) by mouth 2 (two) times daily with a meal. 07/06/20 10/04/20  Enzo Bi, MD  ciprofloxacin (CIPRO) 500 MG tablet Take 1 tablet (500 mg total) by mouth daily. 07/21/20   Loletha Grayer, MD  Darunavir-Cobicisctat-Emtricitabine-Tenofovir Alafenamide (SYMTUZA) 800-150-200-10 MG TABS Take 1 tablet by mouth daily with supper. 07/06/20   Enzo Bi, MD  epoetin alfa (EPOGEN) 10000 UNIT/ML injection Inject 1 mL (10,000 Units total) into the vein every Monday, Wednesday, and Friday with hemodialysis. 07/06/20   Enzo Bi, MD  ferrous sulfate (FER-IN-SOL) 75 (15 Fe) MG/ML SOLN Take 1.2 mLs (18 mg of iron total) by mouth daily. 07/21/20   Loletha Grayer, MD  hydrALAZINE (APRESOLINE) 100 MG tablet Take 1 tablet (100 mg total) by mouth every 8 (eight) hours. 07/21/20   Loletha Grayer, MD  metroNIDAZOLE (FLAGYL) 500 MG tablet Take 1 tablet (500 mg total) by mouth every 8 (eight) hours. 07/21/20   Loletha Grayer, MD  multivitamin (RENA-VIT) TABS tablet Take 1 tablet by mouth at bedtime. 07/06/20   Enzo Bi, MD  Allergies Patient has no known allergies.  Family History  Problem Relation Age of Onset   Diabetes Mellitus II Mother    Hypertension Mother     Social History Social History   Tobacco Use   Smoking status: Every Day   Smokeless tobacco: Never  Substance Use Topics   Alcohol use: Not Currently   Drug use: Never    Review of Systems Constitutional: No  fever/chills Eyes: No visual changes. ENT: No sore throat. Cardiovascular: Endorses chest pain. Respiratory: Endorses shortness of breath. Gastrointestinal: No abdominal pain.  No nausea, no vomiting.  No diarrhea. Genitourinary: Negative for dysuria. Musculoskeletal: Negative for acute arthralgias Skin: Negative for rash. Neurological: Negative for headaches, weakness/numbness/paresthesias in any extremity Psychiatric: Negative for suicidal ideation/homicidal ideation   ____________________________________________   PHYSICAL EXAM:  VITAL SIGNS: ED Triage Vitals  Enc Vitals Group     BP 04/29/21 0708 (!) 142/110     Pulse Rate 04/29/21 0708 90     Resp 04/29/21 0708 18     Temp 04/29/21 0708 97.8 F (36.6 C)     Temp Source 04/29/21 0708 Oral     SpO2 04/29/21 0708 98 %     Weight 04/29/21 0537 138 lb (62.6 kg)     Height 04/29/21 0537 '5\' 6"'$  (1.676 m)     Head Circumference --      Peak Flow --      Pain Score 04/29/21 0537 8     Pain Loc --      Pain Edu? --      Excl. in [place]? --    Constitutional: Alert and oriented. Well appearing and in no acute distress. Eyes: Conjunctivae are normal. PERRL. Head: Atraumatic. Nose: No congestion/rhinnorhea. Mouth/Throat: Mucous membranes are moist. Neck: No stridor Cardiovascular: Grossly normal heart sounds.  Good peripheral circulation. Respiratory: Increased respiratory effort.  No retractions. Gastrointestinal: Soft and nontender. No distention. Musculoskeletal: No obvious deformities Neurologic:  Normal speech and language. No gross focal neurologic deficits are appreciated. Skin:  Skin is warm and dry. No rash noted. Psychiatric: Mood and affect are normal. Speech and behavior are normal.  ____________________________________________   LABS (all labs ordered are listed, but only abnormal results are displayed)  Labs Reviewed  BASIC METABOLIC PANEL - Abnormal; Notable for the following components:      Result Value    CO2 21 (*)    Glucose, Bld 106 (*)    BUN 74 (*)    Creatinine, Ser 13.88 (*)    Calcium 8.4 (*)    GFR, Estimated 4 (*)    Anion gap 16 (*)    All other components within normal limits  CBC - Abnormal; Notable for the following components:   WBC 3.7 (*)    RBC 3.37 (*)    Hemoglobin 8.6 (*)    HCT 26.7 (*)    MCV 79.2 (*)    MCH 25.5 (*)    RDW 23.6 (*)    All other components within normal limits  TROPONIN I (HIGH SENSITIVITY) - Abnormal; Notable for the following components:   Troponin I (High Sensitivity) 169 (*)    All other components within normal limits   ____________________________________________  EKG  ED ECG REPORT I, Naaman Plummer, the attending physician, personally viewed and interpreted this ECG.  Date: 04/30/2021 EKG Time: 0544 Rate: 90 Rhythm: normal sinus rhythm QRS Axis: normal Intervals: LAFB ST/T Wave abnormalities: normal Narrative Interpretation: LAFB.  No evidence of acute ischemia  ____________________________________________  RADIOLOGY  ED MD interpretation: 2 view chest x-ray shows mild pulmonary edema  Official radiology report(s): No results found.  ____________________________________________   PROCEDURES  Procedure(s) performed (including Critical Care):  .1-3 Lead EKG Interpretation  Date/Time: 04/30/2021 1:20 PM Performed by: Naaman Plummer, MD Authorized by: Naaman Plummer, MD     Interpretation: normal     ECG rate:  90   ECG rate assessment: normal     Rhythm: sinus rhythm     Ectopy: none     Conduction: normal     ____________________________________________   INITIAL IMPRESSION / ASSESSMENT AND PLAN / ED COURSE  As part of my medical decision making, I reviewed the following data within the electronic medical record, if available:  Nursing notes reviewed and incorporated, Labs reviewed, EKG interpreted, Old chart reviewed, Radiograph reviewed and Notes from prior ED visits reviewed and  incorporated        + dyspnea +LE edema + Non adherence to medication regimen  Workup: ECG, CBC, BMP, Troponin, BNP, CXR Findings: EKG: No STEMI and no evidence of Brugadas sign, delta wave, epsilon wave, significantly prolonged QTc, or malignant arrhythmia. CXR: Pulmonary edema Based on history, exam and findings, presentation most consistent with acute on chronic heart failure. Low suspicion for PNA, ACS, tamponade, aortic dissection. Interventions: Dialysis  Disposition (ESRD, missed HD): Plan immediate dialysis from the emergency department with renal consult for expedited HD.      ____________________________________________   FINAL CLINICAL IMPRESSION(S) / ED DIAGNOSES  Final diagnoses:  Dialysis patient [facility])     ED Discharge Orders     None        Note:  This document was prepared using Dragon voice recognition software and may include unintentional dictation errors.    Naaman Plummer, MD 04/30/21 1320

## 2021-10-02 IMAGING — US US RENAL
1 series · 14 of 25 positions shown · non-contrast
Comparison: None.

CLINICAL DATA: End-stage renal disease

EXAM:
RENAL / URINARY TRACT ULTRASOUND COMPLETE

[Series 1: us renal · 14 of 34 slices shown]
[im 1/34]
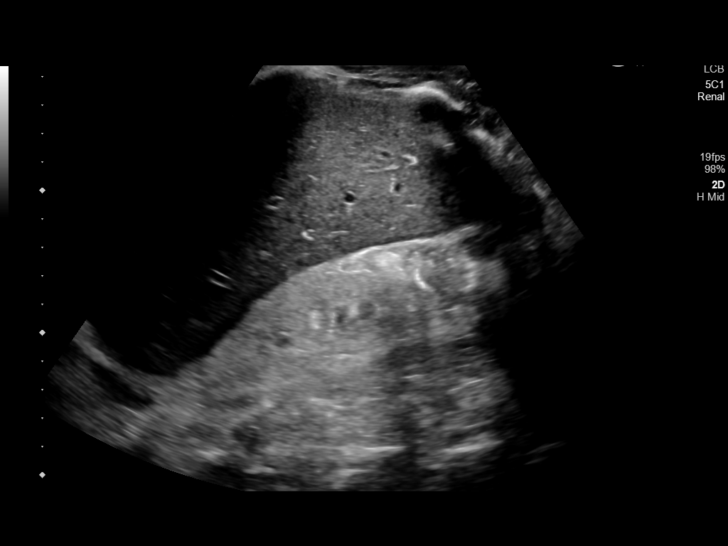
[im 3/34]
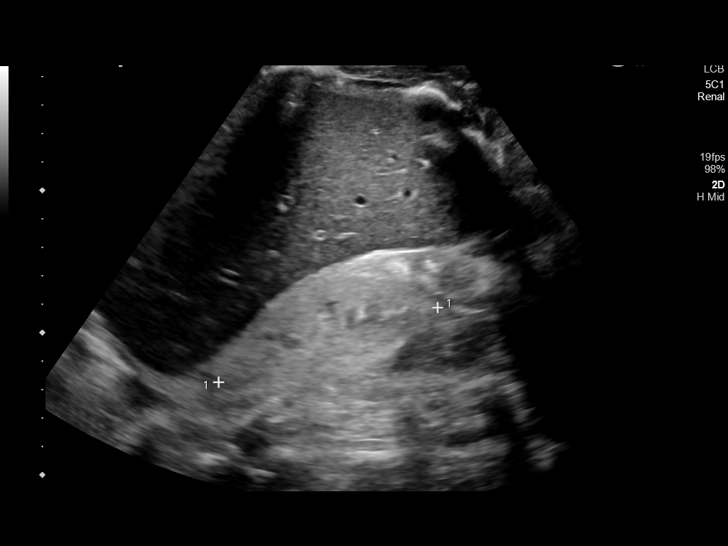
[im 6/34]
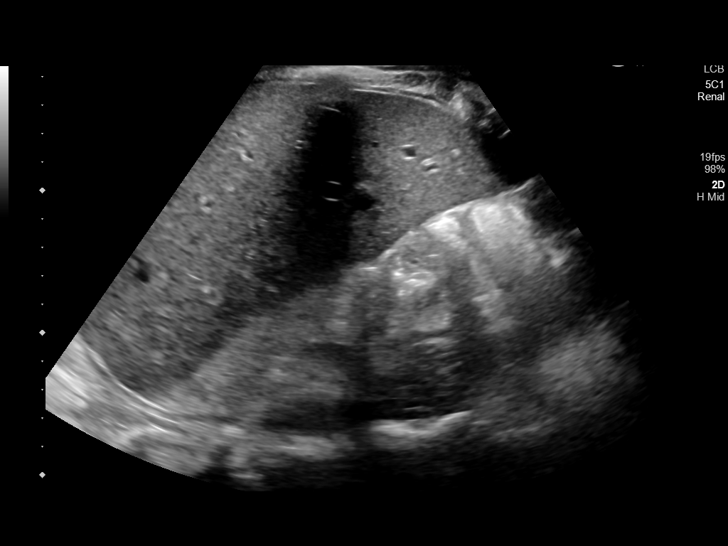
[im 9/34]
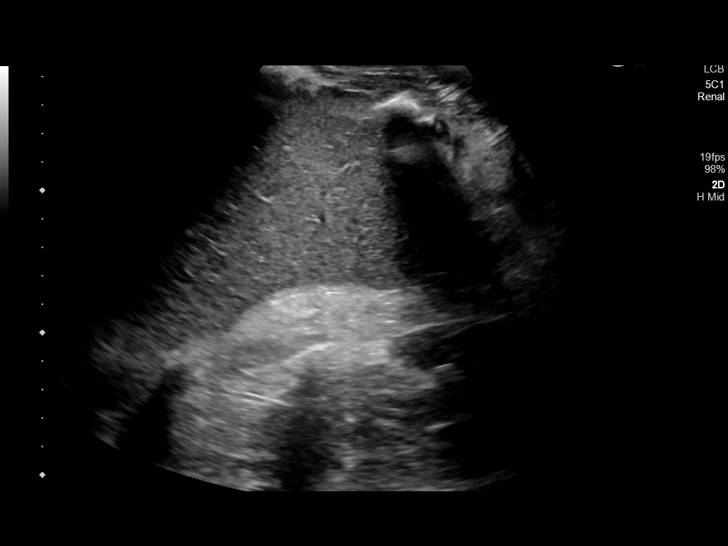
[im 12/34]
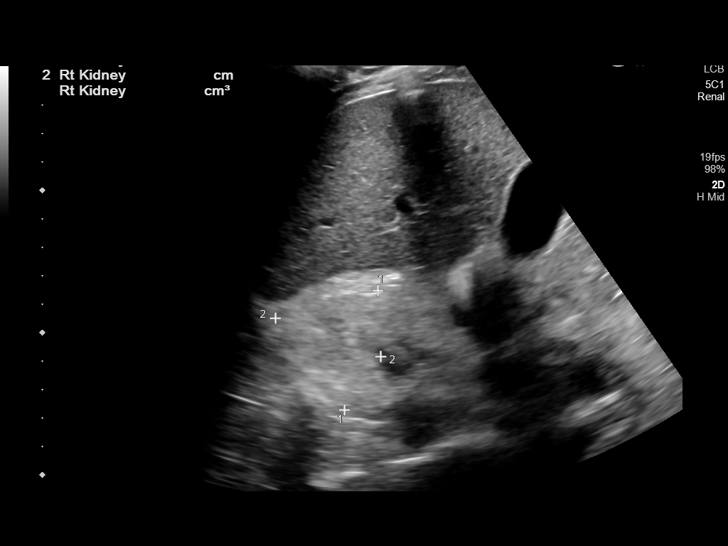
[im 13/34]
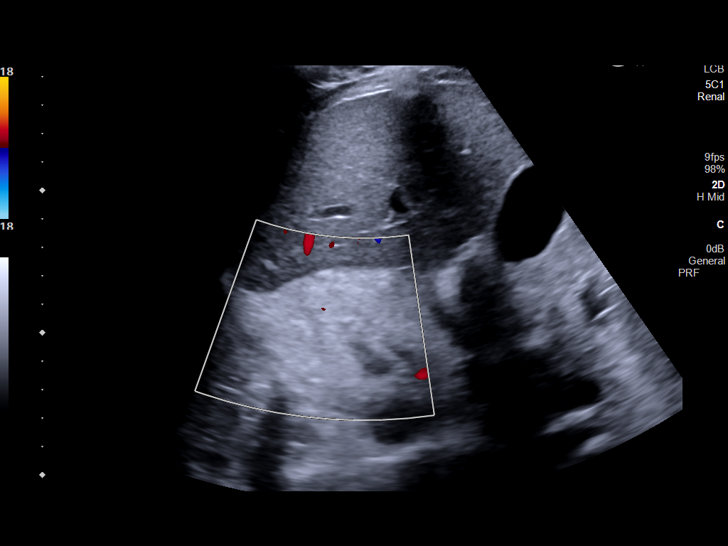
[im 16/34]
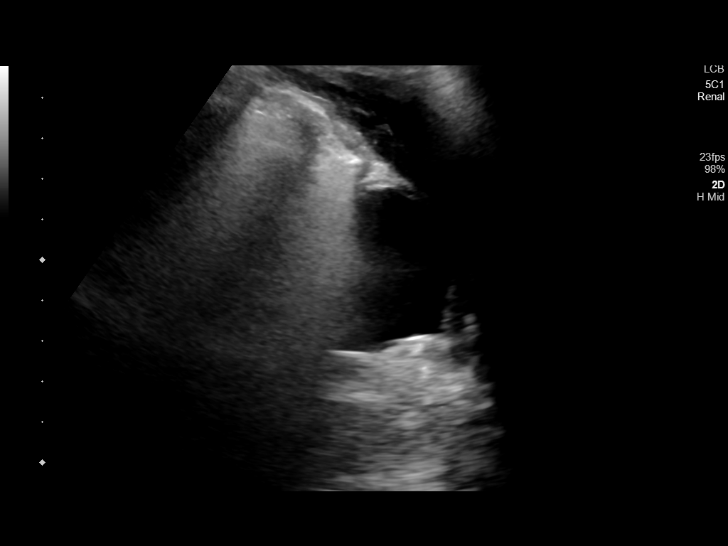
[im 18/34]
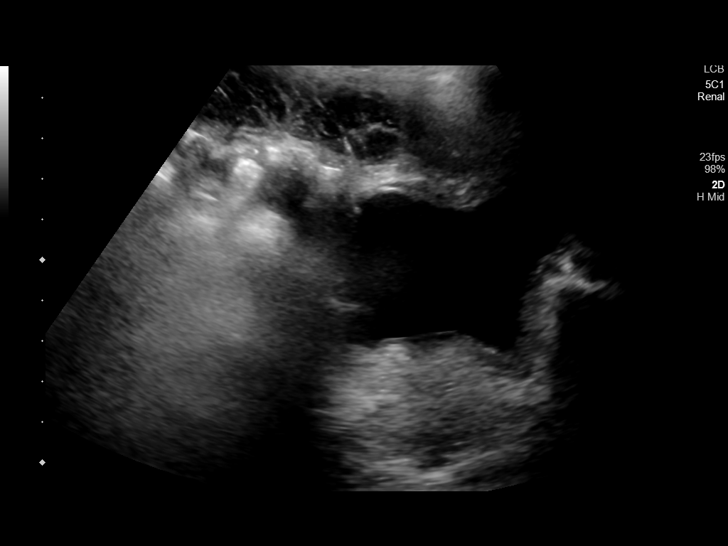
[im 21/34]
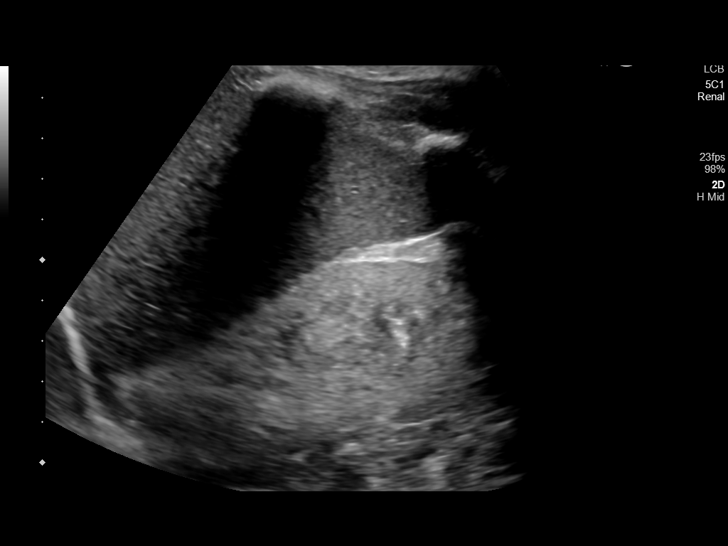
[im 23/34]
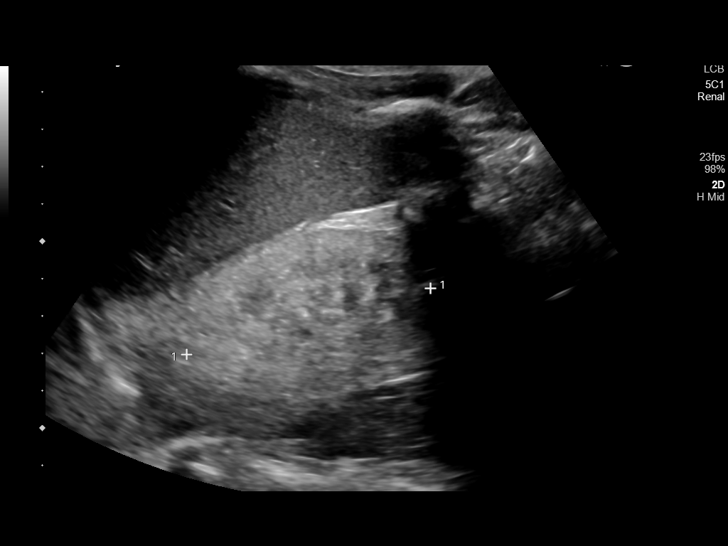
[im 25/34]
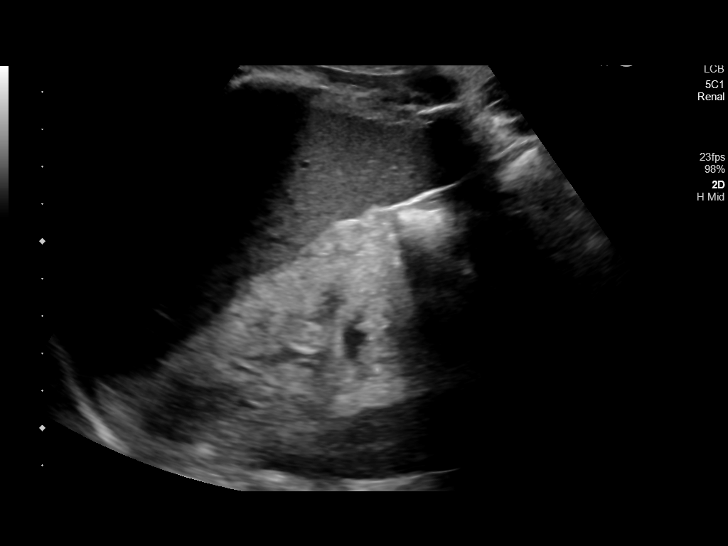
[im 28/34]
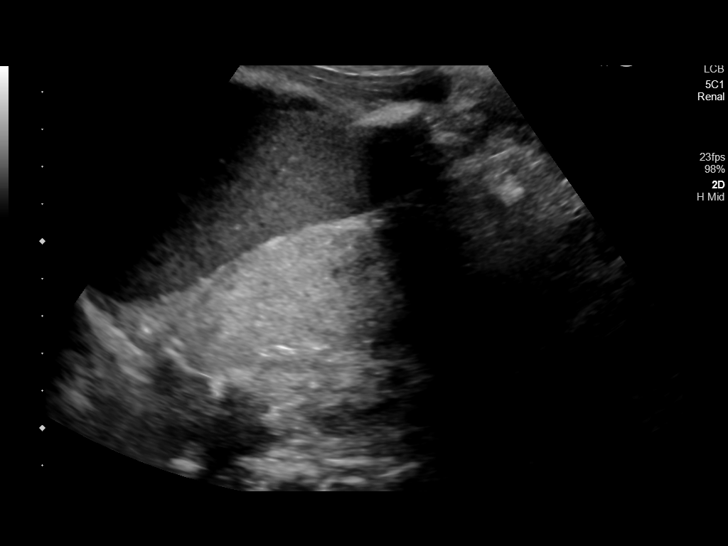
[im 31/34]
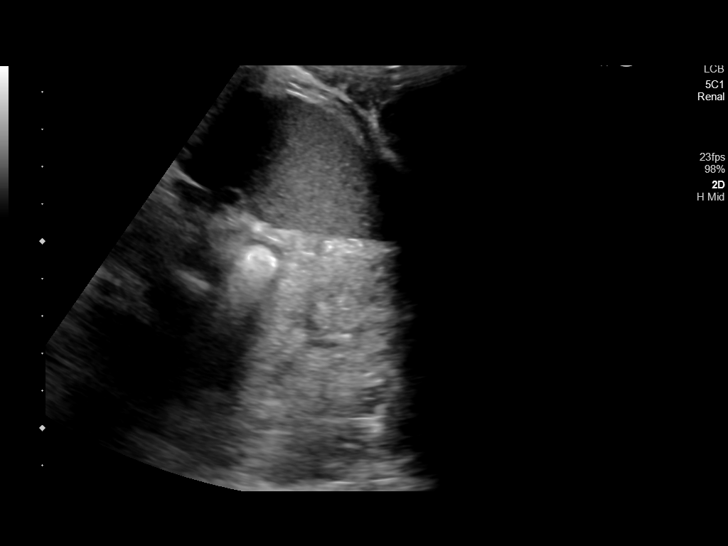
[im 34/34]
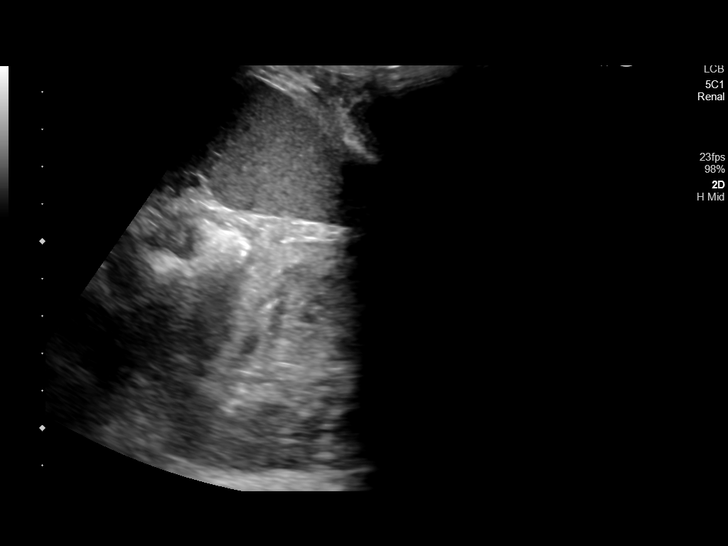

[14 of 25 positions shown; findings below may reference images not displayed]

FINDINGS: Right Kidney:

Renal measurements: 8.1 x 4.3 x 3.9 cm = volume: 73 mL. The kidney
is atrophic and severely echogenic. There is no hydronephrosis.

Left Kidney:

Renal measurements: 6.7 x 3.8 x 3.6 cm = volume: 49 mL. The kidney
is atrophic and severely echogenic. There is no hydronephrosis.

Bladder:

Appears normal for degree of bladder distention.

Other:

None.
IMPRESSION: Small, echogenic kidneys bilaterally consistent with a history of
end-stage renal disease. There is no hydronephrosis.

## 2021-10-14 IMAGING — DX DG CHEST 1V PORT
1 series · 1 of 1 positions shown · non-contrast
Comparison: 06/27/2020.  CT 06/27/2020.

CLINICAL DATA: Pleural effusion.

EXAM:
PORTABLE CHEST 1 VIEW

[chest ap]
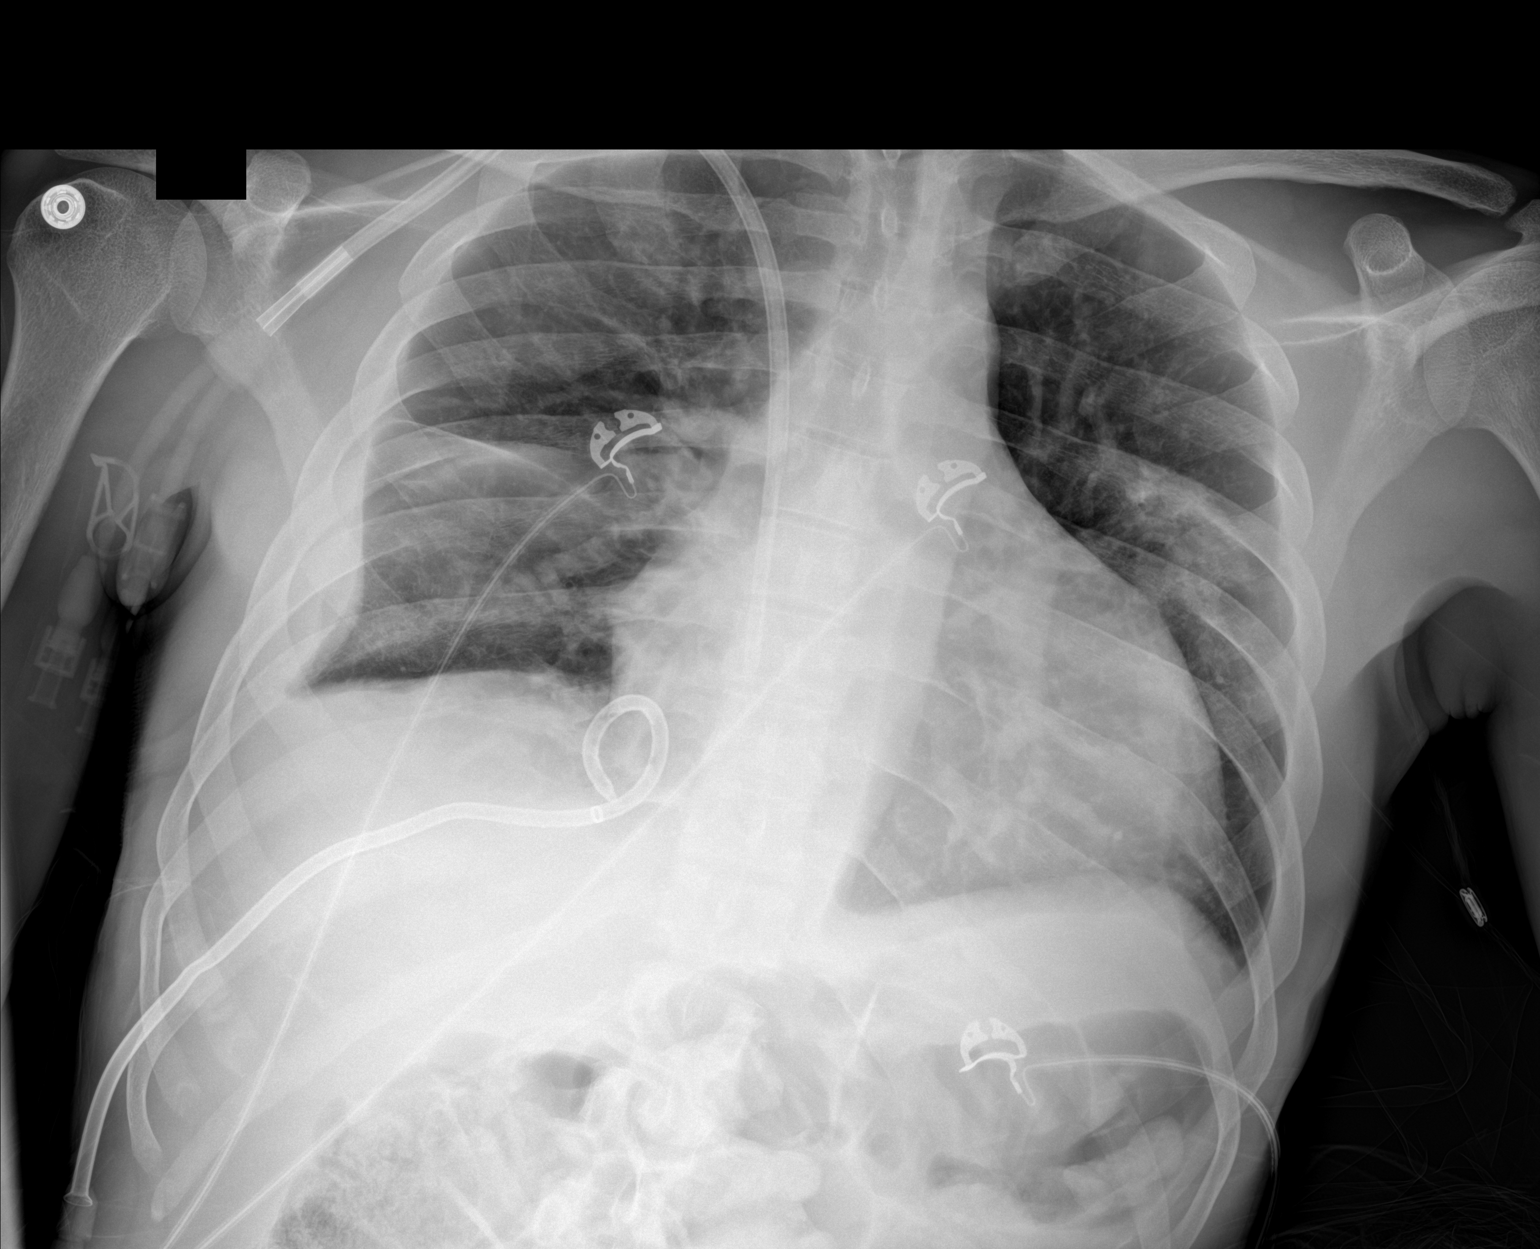

[1 of 1 positions shown; findings below may reference images not displayed]

FINDINGS: Dual-lumen catheter right chest tube in stable position. Mediastinum
hilar structures normal. Stable cardiomegaly. Bilateral pulmonary
infiltrates are again noted. Interim partial resolution of right
pleural effusion from prior exam. No pneumothorax
IMPRESSION: 1. Right chest tube in stable position. Interim partial resolution
of right pleural effusion from prior exam.

2. Bilateral pulmonary infiltrates again noted without interim
change.

3.  Stable cardiomegaly.

## 2022-04-08 ENCOUNTER — Non-Acute Institutional Stay
Admission: EM | Admit: 2022-04-08 | Discharge: 2022-04-08 | Disposition: A | Discharge status: Home / Self Care | Payer: Medicaid Other | Attending: Emergency Medicine | Admitting: Emergency Medicine

## 2022-04-08 ENCOUNTER — Other Ambulatory Visit: Payer: Self-pay

## 2022-04-08 ENCOUNTER — Emergency Department: Payer: Medicaid Other

## 2022-04-08 DIAGNOSIS — N186 End stage renal disease: Secondary | ICD-10-CM | POA: Diagnosis not present

## 2022-04-08 DIAGNOSIS — Z21 Asymptomatic human immunodeficiency virus [HIV] infection status: Secondary | ICD-10-CM | POA: Diagnosis not present

## 2022-04-08 DIAGNOSIS — I1311 Hypertensive heart and chronic kidney disease without heart failure, with stage 5 chronic kidney disease, or end stage renal disease: Secondary | ICD-10-CM | POA: Diagnosis not present

## 2022-04-08 DIAGNOSIS — R0789 Other chest pain: Secondary | ICD-10-CM | POA: Diagnosis not present

## 2022-04-08 DIAGNOSIS — D631 Anemia in chronic kidney disease: Secondary | ICD-10-CM | POA: Diagnosis not present

## 2022-04-08 DIAGNOSIS — N2581 Secondary hyperparathyroidism of renal origin: Secondary | ICD-10-CM | POA: Insufficient documentation

## 2022-04-08 DIAGNOSIS — R0602 Shortness of breath: Secondary | ICD-10-CM | POA: Diagnosis present

## 2022-04-08 DIAGNOSIS — J810 Acute pulmonary edema: Secondary | ICD-10-CM

## 2022-04-08 DIAGNOSIS — Z992 Dependence on renal dialysis: Secondary | ICD-10-CM | POA: Diagnosis not present

## 2022-04-08 LAB — RENAL FUNCTION PANEL
Albumin: 3.8 g/dL (ref 3.5–5.0)
Anion gap: 22 — ABNORMAL HIGH (ref 5–15)
BUN: 100 mg/dL — ABNORMAL HIGH (ref 6–20)
CO2: 18 mmol/L — ABNORMAL LOW (ref 22–32)
Calcium: 8.7 mg/dL — ABNORMAL LOW (ref 8.9–10.3)
Chloride: 97 mmol/L — ABNORMAL LOW (ref 98–111)
Creatinine, Ser: 15.25 mg/dL — ABNORMAL HIGH (ref 0.61–1.24)
GFR, Estimated: 4 mL/min — ABNORMAL LOW (ref >60)
Glucose, Bld: 71 mg/dL (ref 70–99)
Phosphorus: 10.2 mg/dL — ABNORMAL HIGH (ref 2.5–4.6)
Potassium: 5.5 mmol/L — ABNORMAL HIGH (ref 3.5–5.1)
Sodium: 137 mmol/L (ref 135–145)

## 2022-04-08 LAB — CBC
HCT: 32.6 % — ABNORMAL LOW (ref 39.0–52.0)
Hemoglobin: 10.3 g/dL — ABNORMAL LOW (ref 13.0–17.0)
MCH: 25.9 pg — ABNORMAL LOW (ref 26.0–34.0)
MCHC: 31.6 g/dL (ref 30.0–36.0)
MCV: 82.1 fL (ref 80.0–100.0)
Platelets: 157 10*3/uL (ref 150–400)
RBC: 3.97 MIL/uL — ABNORMAL LOW (ref 4.22–5.81)
RDW: 22.9 % — ABNORMAL HIGH (ref 11.5–15.5)
WBC: 4.9 10*3/uL (ref 4.0–10.5)
nRBC: 0 % (ref 0.0–0.2)

## 2022-04-08 LAB — BASIC METABOLIC PANEL
Anion gap: 21 — ABNORMAL HIGH (ref 5–15)
BUN: 99 mg/dL — ABNORMAL HIGH (ref 6–20)
CO2: 20 mmol/L — ABNORMAL LOW (ref 22–32)
Calcium: 8.7 mg/dL — ABNORMAL LOW (ref 8.9–10.3)
Chloride: 99 mmol/L (ref 98–111)
Creatinine, Ser: 15.33 mg/dL — ABNORMAL HIGH (ref 0.61–1.24)
GFR, Estimated: 4 mL/min — ABNORMAL LOW (ref >60)
Glucose, Bld: 86 mg/dL (ref 70–99)
Potassium: 4.7 mmol/L (ref 3.5–5.1)
Sodium: 140 mmol/L (ref 135–145)

## 2022-04-08 LAB — TROPONIN I (HIGH SENSITIVITY)
Troponin I (High Sensitivity): 198 ng/L (ref <18)
Troponin I (High Sensitivity): 195 ng/L (ref <18)

## 2022-04-08 MED ORDER — LABETALOL HCL 5 MG/ML IV SOLN
INTRAVENOUS | Status: AC
  Administered 2022-04-08: 10 mg via INTRAVENOUS
  Filled 2022-04-08: qty 4

## 2022-04-08 MED ORDER — DIPHENHYDRAMINE HCL 50 MG/ML IJ SOLN
25.0000 mg | Freq: Once | INTRAMUSCULAR | Status: AC
Start: 2022-04-08 — End: 2022-04-08

## 2022-04-08 MED ORDER — HEPARIN SODIUM (PORCINE) 1000 UNIT/ML DIALYSIS
1000.0000 [IU] | INTRAMUSCULAR | Status: DC | PRN
  Filled 2022-04-08: qty 1

## 2022-04-08 MED ORDER — LIDOCAINE-PRILOCAINE 2.5-2.5 % EX CREA: 1.0000 | TOPICAL_CREAM | CUTANEOUS | Status: DC | PRN

## 2022-04-08 MED ORDER — OXYCODONE-ACETAMINOPHEN 5-325 MG PO TABS
1.0000 | ORAL_TABLET | Freq: Once | ORAL | Status: AC
  Administered 2022-04-08: 1 via ORAL
  Filled 2022-04-08: qty 1

## 2022-04-08 MED ORDER — CHLORHEXIDINE GLUCONATE CLOTH 2 % EX PADS
6.0000 | MEDICATED_PAD | Freq: Every day | CUTANEOUS | Status: DC
  Filled 2022-04-08: qty 6

## 2022-04-08 MED ORDER — LIDOCAINE 5 % EX PTCH
1.0000 | MEDICATED_PATCH | CUTANEOUS | Status: DC
  Administered 2022-04-08: 1 via TRANSDERMAL
  Filled 2022-04-08: qty 1

## 2022-04-08 MED ORDER — DIPHENHYDRAMINE HCL 25 MG PO CAPS
ORAL_CAPSULE | ORAL | Status: AC
  Filled 2022-04-08: qty 1

## 2022-04-08 MED ORDER — LIDOCAINE HCL (PF) 1 % IJ SOLN: 5.0000 mL | INTRAMUSCULAR | Status: DC | PRN

## 2022-04-08 MED ORDER — DIPHENHYDRAMINE HCL 25 MG PO CAPS
25.0000 mg | ORAL_CAPSULE | Freq: Once | ORAL | Status: DC
Start: 2022-04-08 — End: 2022-04-08

## 2022-04-08 MED ORDER — DIPHENHYDRAMINE HCL 50 MG/ML IJ SOLN
INTRAMUSCULAR | Status: AC
  Administered 2022-04-08: 25 mg via INTRAVENOUS
  Filled 2022-04-08: qty 1

## 2022-04-08 MED ORDER — ALTEPLASE 2 MG IJ SOLR: 2.0000 mg | Freq: Once | INTRAMUSCULAR | Status: DC | PRN

## 2022-04-08 MED ORDER — HEPARIN SODIUM (PORCINE) 1000 UNIT/ML IJ SOLN
INTRAMUSCULAR | Status: AC
  Administered 2022-04-08: 4100 [IU] via INTRAVENOUS_CENTRAL
  Filled 2022-04-08: qty 10

## 2022-04-08 MED ORDER — LABETALOL HCL 5 MG/ML IV SOLN: 10.0000 mg | Freq: Once | INTRAVENOUS | Status: AC

## 2022-04-08 MED ORDER — MORPHINE SULFATE (PF) 2 MG/ML IV SOLN: 2.0000 mg | Freq: Once | INTRAVENOUS | Status: AC

## 2022-04-08 MED ORDER — MORPHINE SULFATE (PF) 2 MG/ML IV SOLN
INTRAVENOUS | Status: AC
  Administered 2022-04-08: 2 mg via INTRAVENOUS
  Filled 2022-04-08: qty 1

## 2022-04-08 MED ORDER — PENTAFLUOROPROP-TETRAFLUOROETH EX AERO: 1.0000 | INHALATION_SPRAY | CUTANEOUS | Status: DC | PRN

## 2022-04-08 NOTE — Progress Notes (Signed)
[facility]  ROUNDING NOTE   Subjective:     Objective:  Vital signs in last 24 hours:  Temp:  [98.4 F (36.9 C)-98.5 F (36.9 C)] 98.5 F (36.9 C) (07/04 1249) Pulse Rate:  [99-111] 102 (07/04 1500) Resp:  [18-31] 28 (07/04 1500) BP: (191-228)/(142-181) 218/176 (07/04 1500) SpO2:  [98 %-100 %] 100 % (07/04 1500) Weight:  [67.1 kg-69.5 kg] 69.5 kg (07/04 1249)  Weight change:  Filed Weights   04/08/22 0733 04/08/22 1249  Weight: 67.1 kg 69.5 kg    Intake/Output: No intake/output data recorded.   Intake/Output this shift:  No intake/output data recorded.  Physical Exam: General: No acute distress  Head: Normocephalic, atraumatic. Moist oral mucosal membranes  Neck: Supple  Lungs:  Clear to auscultation, normal effort  Heart: S1S2 no rubs  Abdomen:  Soft, nontender, bowel sounds present  Extremities: Trace peripheral edema.  Neurologic: Awake, alert, following commands  Skin: No acute rash  Access: IJ PermCath    Basic Metabolic Panel: Recent Labs  Lab 04/08/22 0735  NA 140  K 4.7  CL 99  CO2 20*  GLUCOSE 86  BUN 99*  CREATININE 15.33*  CALCIUM 8.7*    Liver Function Tests: No results for input(s): "AST", "ALT", "ALKPHOS", "BILITOT", "PROT", "ALBUMIN" in the last 168 hours. No results for input(s): "LIPASE", "AMYLASE" in the last 168 hours. No results for input(s): "AMMONIA" in the last 168 hours.  CBC: Recent Labs  Lab 04/08/22 0735  WBC 4.9  HGB 10.3*  HCT 32.6*  MCV 82.1  PLT 157    Cardiac Enzymes: No results for input(s): "CKTOTAL", "CKMB", "CKMBINDEX", "TROPONINI" in the last 168 hours.  BNP: Invalid input(s): "POCBNP"  CBG: No results for input(s): "GLUCAP" in the last 168 hours.  Microbiology: Results for orders placed or performed during the hospital encounter of 07/19/20  Respiratory Panel by RT PCR (Flu A&B, Covid) - Nasopharyngeal Swab     Status: None   Collection Time: 07/19/20  6:20 AM   Specimen:  Nasopharyngeal Swab  Result Value Ref Range Status   SARS Coronavirus 2 by RT PCR NEGATIVE NEGATIVE Final    Comment: (NOTE) SARS-CoV-2 target nucleic acids are NOT DETECTED.  The SARS-CoV-2 RNA is generally detectable in upper respiratoy specimens during the acute phase of infection. The lowest concentration of SARS-CoV-2 viral copies this assay can detect is 131 copies/mL. A negative result does not preclude SARS-Cov-2 infection and should not be used as the sole basis for treatment or other patient management decisions. A negative result may occur with  improper specimen collection/handling, submission of specimen other than nasopharyngeal swab, presence of viral mutation(s) within the areas targeted by this assay, and inadequate number of viral copies (<131 copies/mL). A negative result must be combined with clinical observations, patient history, and epidemiological information. The expected result is Negative.  Fact Sheet for Patients:  PinkCheek.be  Fact Sheet for Healthcare Providers:  GravelBags.it  This test is no t yet approved or cleared by the [place] FDA and  has been authorized for detection and/or diagnosis of SARS-CoV-2 by FDA under an Emergency Use Authorization (EUA). This EUA will remain  in effect (meaning this test can be used) for the duration of the COVID-19 declaration under Section 564(b)(1) of the Act, 21 U.S.C. section 360bbb-3(b)(1), unless the authorization is terminated or revoked sooner.     Influenza A by PCR NEGATIVE NEGATIVE Final   Influenza B by PCR NEGATIVE NEGATIVE Final    Comment: (  NOTE) The Xpert Xpress SARS-CoV-2/FLU/RSV assay is intended as an aid in  the diagnosis of influenza from Nasopharyngeal swab specimens and  should not be used as a sole basis for treatment. Nasal washings and  aspirates are unacceptable for Xpert Xpress SARS-CoV-2/FLU/RSV  testing.  Fact Sheet  for Patients: PinkCheek.be  Fact Sheet for Healthcare Providers: GravelBags.it  This test is not yet approved or cleared by the [place] FDA and  has been authorized for detection and/or diagnosis of SARS-CoV-2 by  FDA under an Emergency Use Authorization (EUA). This EUA will remain  in effect (meaning this test can be used) for the duration of the  Covid-19 declaration under Section 564(b)(1) of the Act, 21  U.S.C. section 360bbb-3(b)(1), unless the authorization is  terminated or revoked. Performed at [facility], [address] [place], [place] [address]   Culture, blood (x 2)     Status: None   Collection Time: 07/20/20  5:43 AM   Specimen: BLOOD  Result Value Ref Range Status   Specimen Description BLOOD LEFT HAND  Final   Special Requests   Final    BOTTLES DRAWN AEROBIC AND ANAEROBIC Blood Culture adequate volume   Culture   Final    NO GROWTH 5 DAYS Performed at [facility], [address], [place], [place] [address]    Report Status 07/25/2020 FINAL  Final  Culture, blood (x 2)     Status: None   Collection Time: 07/20/20  5:44 AM   Specimen: BLOOD  Result Value Ref Range Status   Specimen Description BLOOD RIGHT [facility]  Final   Special Requests   Final    BOTTLES DRAWN AEROBIC AND ANAEROBIC Blood Culture adequate volume   Culture   Final    NO GROWTH 5 DAYS Performed at [facility], [address] [place], [place] [address]    Report Status 07/25/2020 FINAL  Final    Coagulation Studies: No results for input(s): "LABPROT", "INR" in the last 72 hours.  Urinalysis: No results for input(s): "COLORURINE", "LABSPEC", "PHURINE", "GLUCOSEU", "HGBUR", "BILIRUBINUR", "KETONESUR", "PROTEINUR", "UROBILINOGEN", "NITRITE", "LEUKOCYTESUR" in the last 72 hours.  Invalid input(s): "APPERANCEUR"    Imaging: DG Chest 2 View  Result Date: 04/08/2022 CLINICAL DATA:  Shortness of  breath. EXAM: CHEST - 2 VIEW COMPARISON:  Chest x-rays dated 04/29/2021 and 07/05/2020. FINDINGS: Stable cardiomegaly. RIGHT chest wall dialysis catheter in place with tip appropriately positioned at the level the RIGHT atrium. Mild central pulmonary vascular congestion, likely chronic. Patchy airspace opacities within the LEFT perihilar lung, similar to earlier exams, most likely asymmetric edema. Lungs otherwise clear. No pleural effusion or pneumothorax is seen. IMPRESSION: 1. Cardiomegaly with central pulmonary vascular congestion, similar to previous exams, consistent with a chronic mild CHF/volume overload. 2. Patchy airspace opacities within the LEFT perihilar lung, also similar to earlier exams, compatible with associated asymmetric pulmonary edema. Electronically Signed   By: Franki Cabot M.D.   On: 04/08/2022 08:43     Medications:     Chlorhexidine Gluconate Cloth  6 each Topical Q0600   lidocaine  1 patch Transdermal Q24H   alteplase, heparin, lidocaine (PF), lidocaine-prilocaine, pentafluoroprop-tetrafluoroeth  Assessment/ Plan:  31 y.o. male ESRD on HD, hypertension, HIV, anemia of chronic kidney disease, secondary hyperparathyroidism who normally dialyzes in [place] and is in [place] visiting family without outpatient hemodialysis set up.  1.  ESRD on HD in [place] here visiting in [place]. 2.  Hypertension. 3.  Anemia of  chronic kidney disease. 4.  Secondary hyperparathyroidism.  Plan: Patient well-known to [place] from prior admissions.  He is here visiting in [place].  He normally dialyzes in [place].  It appears that he did not have outpatient hemodialysis set up while here.  He presented to the emergency department for this purpose.  Blood pressure noted to be quite high.  Blood pressure should respond a bit to dialysis though if remains high we will consider administering clonidine.  Hemoglobin 10.3 therefore we will hold off on Epogen at this time.  Would  recommend periodic monitoring of bone metabolism parameters if he remains here.   LOS: 0 Aydn Ferrara 7/4/20233:23 PM

## 2022-04-08 NOTE — Progress Notes (Signed)
Hemodialysis Post Treatment Note:  Tx date:04/08/2022 Tx time:3 hours and 17 minutes Access: right CVC UF Removed: 2279 ml  Note:  Pre HD BP 217/168   13:07: HD started  14:30 BP 233/170 DR lateef notified about his BP, pt asymptomatic  14:45 DR lateef ordered to keep doing dialysis and if his BP is high at the end of treatment, give clonidine 0.1 mg  15:30  241/179  15:35 BP high DR lateef notified again and he ordered to give labetalol 10mg  IV - given see MAR  15:40 pt complained back pain PS of 10. DR Holley Raring notified and ordered to give morphine 2 mg IV (see MAR)  16:25 treatment terminated early as per pt request  DR LAteef notified.  Clonidine 0.1 mg not given even if BP was high because patient already had labetalol 10 mg IV.  Post HD BP 218/165

## 2022-04-08 NOTE — Discharge Instructions (Addendum)
As we discussed, I would recommend staying as your blood pressure and heart rate remain very elevated.  That being said, we cannot keep you against her will.  At home, please take your medications and consider taking an additional dose of clonidine for your significant high blood pressure.

## 2022-04-08 NOTE — ED Notes (Signed)
Pt tx to dialysis.

## 2022-04-08 NOTE — ED Triage Notes (Signed)
Pt to ED [facility] from dialysis, was unable to receive dialysis. From [place], unable to receive dialysis d/t papers not transferred from [place]. Pt hypertensive per EMS, has not taken bp meds.  Last dialysis on Friday.  Reports shob that started Saturday. +chest tightness.  Productive cough noted.  Also c/o bilateral flank pain started over weekend as well. Does not make urine

## 2022-04-08 NOTE — ED Provider Notes (Signed)
[facility] Provider Note    Event Date/Time   First MD Initiated Contact with Patient 04/08/22 0831     (approximate)   History   Chief Complaint Shortness of Breath   HPI  Daniel Combs is a 31 y.o. male with past medical history of hypertension, HIV, and ESRD on HD (MWF) who presents to the ED complaining of shortness of breath.  Patient reports that he primarily resides in [place], where he receives dialysis on Mondays, Wednesdays, and Fridays.  He is currently visiting [place] to see family for the holiday and had attempted to arrange to receive dialysis today while he was visiting.  He states that he went to the dialysis center but they told him they did not receive his paperwork and that he needed to come to the ER.  He states that since yesterday he has been dealing with some tightness in his chest and some difficulty breathing, denies any fevers or cough.  He also states that his lower back has "flared up" over the past couple of days with significant pain.  He states the pain starts in the middle of his lower back and spreads across to both sides, denies any associated trauma.  He states he has dealt with similar back issues in the past and he denies any numbness or weakness in his legs, has not had any bowel or bladder retention/incontinence.     Physical Exam   Triage Vital Signs: ED Triage Vitals  Enc Vitals Group     BP 04/08/22 0732 (!) 191/142     Pulse Rate 04/08/22 0732 99     Resp 04/08/22 0732 (!) 22     Temp 04/08/22 0732 98.4 F (36.9 C)     Temp Source 04/08/22 0732 Oral     SpO2 04/08/22 0732 99 %     Weight 04/08/22 0733 148 lb (67.1 kg)     Height 04/08/22 0733 5\' 6"  (1.676 m)     Head Circumference --      Peak Flow --      Pain Score 04/08/22 0733 8     Pain Loc --      Pain Edu? --      Excl. in [place]? --     Most recent vital signs: Vitals:   04/08/22 1430 04/08/22 1445  BP: (!) 223/170 (!) 228/177   Pulse: (!) 103 (!) 108  Resp: (!) 26 (!) 28  Temp:    SpO2: 100% 99%    Constitutional: Alert and oriented. Eyes: Conjunctivae are normal. Head: Atraumatic. Nose: No congestion/rhinnorhea. Mouth/Throat: Mucous membranes are moist.  Cardiovascular: Normal rate, regular rhythm. Grossly normal heart sounds.  2+ radial pulses bilaterally.  Right chest wall TDC site clean, dry, and intact. Respiratory: Normal respiratory effort.  No retractions. Lungs with crackles to bilateral bases.. Gastrointestinal: Soft and nontender. No distention. Musculoskeletal: No lower extremity tenderness nor edema.  Midline lumbar spinal and paraspinal tenderness to palpation. Neurologic:  Normal speech and language. No gross focal neurologic deficits are appreciated.    ED Results / Procedures / Treatments   Labs (all labs ordered are listed, but only abnormal results are displayed) Labs Reviewed  BASIC METABOLIC PANEL - Abnormal; Notable for the following components:      Result Value   CO2 20 (*)    BUN 99 (*)    Creatinine, Ser 15.33 (*)    Calcium 8.7 (*)    GFR, Estimated 4 (*)  Anion gap 21 (*)    All other components within normal limits  CBC - Abnormal; Notable for the following components:   RBC 3.97 (*)    Hemoglobin 10.3 (*)    HCT 32.6 (*)    MCH 25.9 (*)    RDW 22.9 (*)    All other components within normal limits  TROPONIN I (HIGH SENSITIVITY) - Abnormal; Notable for the following components:   Troponin I (High Sensitivity) 198 (*)    All other components within normal limits  TROPONIN I (HIGH SENSITIVITY) - Abnormal; Notable for the following components:   Troponin I (High Sensitivity) 195 (*)    All other components within normal limits  HEPATITIS B SURFACE ANTIGEN  HEPATITIS B SURFACE ANTIBODY,QUALITATIVE  HEPATITIS B SURFACE ANTIBODY, QUANTITATIVE  HEPATITIS B CORE ANTIBODY, TOTAL  HEPATITIS C ANTIBODY  RENAL FUNCTION PANEL  CBC     EKG  ED ECG REPORT I,  Blake Divine, the attending physician, personally viewed and interpreted this ECG.   Date: 04/08/2022  EKG Time: 7:39  Rate: 107  Rhythm: sinus tachycardia  Axis: LAD  Intervals:left anterior fascicular block  ST&T Change: Lateral T wave inversions  RADIOLOGY Chest x-ray reviewed and interpreted by me with bilateral infiltrates concerning for pulmonary edema, TDC appropriately positioned.  PROCEDURES:  Critical Care performed: No  Procedures   MEDICATIONS ORDERED IN ED: Medications  lidocaine (LIDODERM) 5 % 1 patch (1 patch Transdermal Patch Applied 04/08/22 0853)  Chlorhexidine Gluconate Cloth 2 % PADS 6 each (has no administration in time range)  pentafluoroprop-tetrafluoroeth (GEBAUERS) aerosol 1 Application (has no administration in time range)  lidocaine (PF) (XYLOCAINE) 1 % injection 5 mL (has no administration in time range)  lidocaine-prilocaine (EMLA) cream 1 Application (has no administration in time range)  heparin injection 1,000 Units (has no administration in time range)  alteplase (CATHFLO ACTIVASE) injection 2 mg (has no administration in time range)  oxyCODONE-acetaminophen (PERCOCET/ROXICET) 5-325 MG per tablet 1 tablet (1 tablet Oral Given 04/08/22 0853)  diphenhydrAMINE (BENADRYL) injection 25 mg (25 mg Intravenous Given 04/08/22 1319)     IMPRESSION / MDM / ASSESSMENT AND PLAN / ED COURSE  I reviewed the triage vital signs and the nursing notes.                              31 y.o. male with past medical history of hypertension, HIV, and ESRD on HD (MWF) who presents to the ED complaining of chest tightness and difficulty breathing after being unable to receive dialysis today, last received dialysis 4 days ago.  Patient's presentation is most consistent with acute presentation with potential threat to life or bodily function.  Differential diagnosis includes, but is not limited to, pulmonary edema, ACS, PE, pneumonia, pneumothorax, chronic back pain, lumbar  strain, lumbar radiculopathy, cauda equina.  Patient well-appearing and in no acute distress, vital signs remarkable for elevated blood pressure and mild tachypnea but otherwise reassuring with oxygen saturations of 99% on room air.  EKG shows no evidence of arrhythmia or ischemia, however troponin is elevated.  It appears he has a chronically elevated troponin secondary to his ESRD, however it is slightly elevated beyond his prior baseline.  We will trend troponin but low suspicion for ACS given his atypical symptoms where he denies any chest pain and only reports tightness.  Tightness and shortness of breath are likely secondary to pulmonary edema seen on chest x-ray and patient would benefit  from dialysis.  Remainder of labs are reassuring with no significant anemia, leukocytosis, or electrolyte abnormality.  Patient does have elevated creatinine consistent with his ESRD.  No signs or symptoms to suggest cauda equina, suspect acute on chronic back pain which we will treat with Percocet and Lidoderm patch.  Repeat troponin stable compared to previous and I doubt ACS at this time.  Case discussed with Dr. Holley Raring, who will arrange for dialysis and after which patient would be appropriate for discharge home.      FINAL CLINICAL IMPRESSION(S) / ED DIAGNOSES   Final diagnoses:  Acute pulmonary edema ([place])  ESRD on dialysis [facility])     Rx / DC Orders   ED Discharge Orders     None        Note:  This document was prepared using Dragon voice recognition software and may include unintentional dictation errors.   Blake Divine, MD 04/08/22 1515

## 2022-04-08 NOTE — ED Provider Notes (Signed)
Patient returned from dialysis. He is tachycardic and hypertensive, but also very agitated that he has not been discharged and that he has been here "all day." I discussed that with his HTN and HR, I'd recommend staying for treatment. He states that his vitals are like this due to his agitation with being here and refuses to stay. He understands he is at risk for flash pulm edema, ACS, cardiac arrest by leaving AMA. He is alert and oriented, not intoxicated.   Duffy Bruce, MD 04/08/22 [phone]

## 2022-04-09 LAB — HEPATITIS B CORE ANTIBODY, TOTAL: Hep B Core Total Ab: NONREACTIVE

## 2022-04-09 LAB — HEPATITIS B SURFACE ANTIGEN: Hepatitis B Surface Ag: NONREACTIVE

## 2022-04-09 LAB — HEPATITIS C ANTIBODY: HCV Ab: NONREACTIVE

## 2022-04-09 LAB — HEPATITIS B SURFACE ANTIBODY,QUALITATIVE: Hep B S Ab: REACTIVE — AB

## 2022-04-10 LAB — HEPATITIS B SURFACE ANTIBODY, QUANTITATIVE: Hep B S AB Quant (Post): 228.9 m[IU]/mL (ref >9.9)

## 2023-08-26 ENCOUNTER — Emergency Department
Admission: EM | Admit: 2023-08-26 | Discharge: 2023-08-26 | Disposition: A | Discharge status: Home / Self Care | Payer: Medicaid Other | Attending: Emergency Medicine | Admitting: Emergency Medicine

## 2023-08-26 ENCOUNTER — Other Ambulatory Visit: Payer: Self-pay

## 2023-08-26 ENCOUNTER — Encounter: Payer: Self-pay | Admitting: Emergency Medicine

## 2023-08-26 DIAGNOSIS — N186 End stage renal disease: Secondary | ICD-10-CM | POA: Insufficient documentation

## 2023-08-26 DIAGNOSIS — Z992 Dependence on renal dialysis: Secondary | ICD-10-CM | POA: Diagnosis present

## 2023-08-26 LAB — BASIC METABOLIC PANEL
Anion gap: 16 — ABNORMAL HIGH (ref 5–15)
BUN: 63 mg/dL — ABNORMAL HIGH (ref 6–20)
CO2: 19 mmol/L — ABNORMAL LOW (ref 22–32)
Calcium: 8.3 mg/dL — ABNORMAL LOW (ref 8.9–10.3)
Chloride: 101 mmol/L (ref 98–111)
Creatinine, Ser: 13.3 mg/dL — ABNORMAL HIGH (ref 0.61–1.24)
GFR, Estimated: 5 mL/min — ABNORMAL LOW (ref >60)
Glucose, Bld: 189 mg/dL — ABNORMAL HIGH (ref 70–99)
Potassium: 4.4 mmol/L (ref 3.5–5.1)
Sodium: 136 mmol/L (ref 135–145)

## 2023-08-26 LAB — CBC WITH DIFFERENTIAL/PLATELET
Abs Immature Granulocytes: 0.01 10*3/uL (ref 0.00–0.07)
Basophils Absolute: 0 10*3/uL (ref 0.0–0.1)
Basophils Relative: 1 %
Eosinophils Absolute: 0.6 10*3/uL — ABNORMAL HIGH (ref 0.0–0.5)
Eosinophils Relative: 16 %
HCT: 31.3 % — ABNORMAL LOW (ref 39.0–52.0)
Hemoglobin: 10.3 g/dL — ABNORMAL LOW (ref 13.0–17.0)
Immature Granulocytes: 0 %
Lymphocytes Relative: 21 %
Lymphs Abs: 0.8 10*3/uL (ref 0.7–4.0)
MCH: 27 pg (ref 26.0–34.0)
MCHC: 32.9 g/dL (ref 30.0–36.0)
MCV: 82.2 fL (ref 80.0–100.0)
Monocytes Absolute: 0.5 10*3/uL (ref 0.1–1.0)
Monocytes Relative: 14 %
Neutro Abs: 1.7 10*3/uL (ref 1.7–7.7)
Neutrophils Relative %: 48 %
Platelets: 118 10*3/uL — ABNORMAL LOW (ref 150–400)
RBC: 3.81 MIL/uL — ABNORMAL LOW (ref 4.22–5.81)
RDW: 19.6 % — ABNORMAL HIGH (ref 11.5–15.5)
WBC: 3.6 10*3/uL — ABNORMAL LOW (ref 4.0–10.5)
nRBC: 0 % (ref 0.0–0.2)

## 2023-08-26 NOTE — ED Triage Notes (Signed)
Patient to ED via [facility] for dialysis. Pt states he goes M/W/F. Had appointment set up to go today (visiting from out of town) but they gave his spot away.

## 2023-08-26 NOTE — ED Notes (Signed)
ED Provider at bedside. 

## 2023-08-26 NOTE — ED Notes (Signed)
ED provider to bedside, explains discharge information to pt, that he has an appointment made for dialysis, and subsequent appointments will be made for pt. When he is at that appointment . Pt. Is very upset, arguing with Dr. Rosalia Hammers about appointment. Pt. Provided discharge instructions, walked to WR speaking loudly the whole way. Gait steady.

## 2023-08-26 NOTE — ED Provider Notes (Signed)
   [facility] Provider Note    Event Date/Time   First MD Initiated Contact with Patient 08/26/23 1420     (approximate)   History   Needs Dialysis   HPI  Daniel Combs is a 32 y.o. male  who presents to the emergency department today because of concern for needing dialysis. Patient is visiting from out of town, had dialysis Monday and Tuesday (briefly before leaving to come here). He says that he had arranged to go this morning to [facility] in [place] but got a call this morning saying they no longer had a bed. The patient denies any SOB. Does not currently make urine.       Physical Exam   Triage Vital Signs: ED Triage Vitals  Encounter Vitals Group     BP 08/26/23 1317 97/84     Systolic BP Percentile --      Diastolic BP Percentile --      Pulse Rate 08/26/23 1318 76     Resp 08/26/23 1317 18     Temp 08/26/23 1317 (!) 97.5 F (36.4 C)     Temp Source 08/26/23 1317 Oral     SpO2 08/26/23 1318 95 %     Weight 08/26/23 1318 135 lb (61.2 kg)     Height 08/26/23 1318 5\' 6"  (1.676 m)     Head Circumference --      Peak Flow --      Pain Score 08/26/23 1317 6     Pain Loc --      Pain Education --      Exclude from Growth Chart --     Most recent vital signs: Vitals:   08/26/23 1317 08/26/23 1318  BP: 97/84   Pulse:  76  Resp: 18   Temp: (!) 97.5 F (36.4 C)   SpO2:  95%   General: Awake, alert, oriented. CV:  Good peripheral perfusion.  Resp:  Normal effort.  Abd:  No distention.    ED Results / Procedures / Treatments   Labs (all labs ordered are listed, but only abnormal results are displayed) Labs Reviewed - No data to display   EKG  None   RADIOLOGY None  PROCEDURES:  Critical Care performed: No    MEDICATIONS ORDERED IN ED: Medications - No data to display   IMPRESSION / MDM / ASSESSMENT AND PLAN / ED COURSE  I reviewed the triage vital signs and the nursing notes.                               Differential diagnosis includes, but is not limited to, hyperkalemia, fluid overload, missed dialysis  Patient's presentation is most consistent with acute presentation with potential threat to life or bodily function.  Patient presented to the emergency department today because he was unable to get dialysis at the dialysis center.  He denies any shortness of breath.  No evidence of fluid overload at this time.  Will check blood work to evaluate potassium.  Discussed with Dr. Lourdes Sledge with nephrology who will look into arranging patient's dialysis while he is in town.     FINAL CLINICAL IMPRESSION(S) / ED DIAGNOSES   Final diagnoses:  ESRD (end stage renal disease) on dialysis [facility])     Note:  This document was prepared using Dragon voice recognition software and may include unintentional dictation errors.    Phineas Semen, MD 08/26/23 [phone]

## 2023-08-26 NOTE — ED Notes (Signed)
Pt. Is very frustrated with wait time for d/c instructions. This RN explained delay, thanked pt. For his patience. States MD will discharge as soon as she can. Pt. Continues to be very upset, standing in door. MD aware.

## 2023-08-26 NOTE — ED Notes (Signed)
Patient pushed call light, this RN went in to speak patient. Pt stated that he wants food to eat. This RN told patient that I will check with his RN before giving him something to eat. PT visibly upset.

## 2023-08-26 NOTE — ED Notes (Signed)
Patient given a sandwich tray.

## 2023-08-26 NOTE — Discharge Instructions (Signed)
You were seen in the ER today for evaluation of your missed dialysis.  The soonest dialysis session we were able to arrange for you is on Friday 11/22 at 5:45AM at [facility] at [address], [place], [place] [address].  Return to the ER for new or worsening symptoms.
